# Patient Record
Sex: Female | Born: 1950 | Race: Black or African American | Hispanic: No | Marital: Married | Smoking: Current every day smoker
Health system: Southern US, Community
[De-identification: ages and names within clinical notes are randomized; demographics above are authoritative.]

## PROBLEM LIST (undated history)

## (undated) DIAGNOSIS — E785 Hyperlipidemia, unspecified: Secondary | ICD-10-CM

## (undated) DIAGNOSIS — J40 Bronchitis, not specified as acute or chronic: Secondary | ICD-10-CM

## (undated) DIAGNOSIS — I10 Essential (primary) hypertension: Secondary | ICD-10-CM

## (undated) DIAGNOSIS — I4891 Unspecified atrial fibrillation: Secondary | ICD-10-CM

## (undated) DIAGNOSIS — Z972 Presence of dental prosthetic device (complete) (partial): Secondary | ICD-10-CM

## (undated) DIAGNOSIS — Z720 Tobacco use: Secondary | ICD-10-CM

## (undated) DIAGNOSIS — J189 Pneumonia, unspecified organism: Secondary | ICD-10-CM

## (undated) DIAGNOSIS — I214 Non-ST elevation (NSTEMI) myocardial infarction: Secondary | ICD-10-CM

## (undated) HISTORY — PX: BREAST CYST EXCISION: SHX579

## (undated) HISTORY — PX: COLONOSCOPY WITH PROPOFOL: SHX5780

---

## 1982-07-31 HISTORY — PX: ABDOMINAL HYSTERECTOMY: SHX81

## 2004-07-28 ENCOUNTER — Ambulatory Visit: Payer: Self-pay | Admitting: Internal Medicine

## 2006-05-25 ENCOUNTER — Ambulatory Visit: Payer: Self-pay | Admitting: Internal Medicine

## 2007-01-21 ENCOUNTER — Other Ambulatory Visit: Payer: Self-pay

## 2007-01-21 ENCOUNTER — Emergency Department: Payer: Self-pay | Admitting: Unknown Physician Specialty

## 2007-01-21 IMAGING — CR DG CHEST 2V
1 series · 2 of 2 positions shown · non-contrast
Comparison: none

REASON FOR EXAM: shortness of breath
COMMENTS:

PROCEDURE:     DXR - DXR CHEST PA (OR AP) AND LATERAL  - [DATE]  [DATE]
RESULT:     The lung fields are clear.  The heart, mediastinal and osseous
structures show no acute changes.

[Series 1: view not recorded · 0.17mm/px · 2 of 2 slices shown]
[im 1/2]
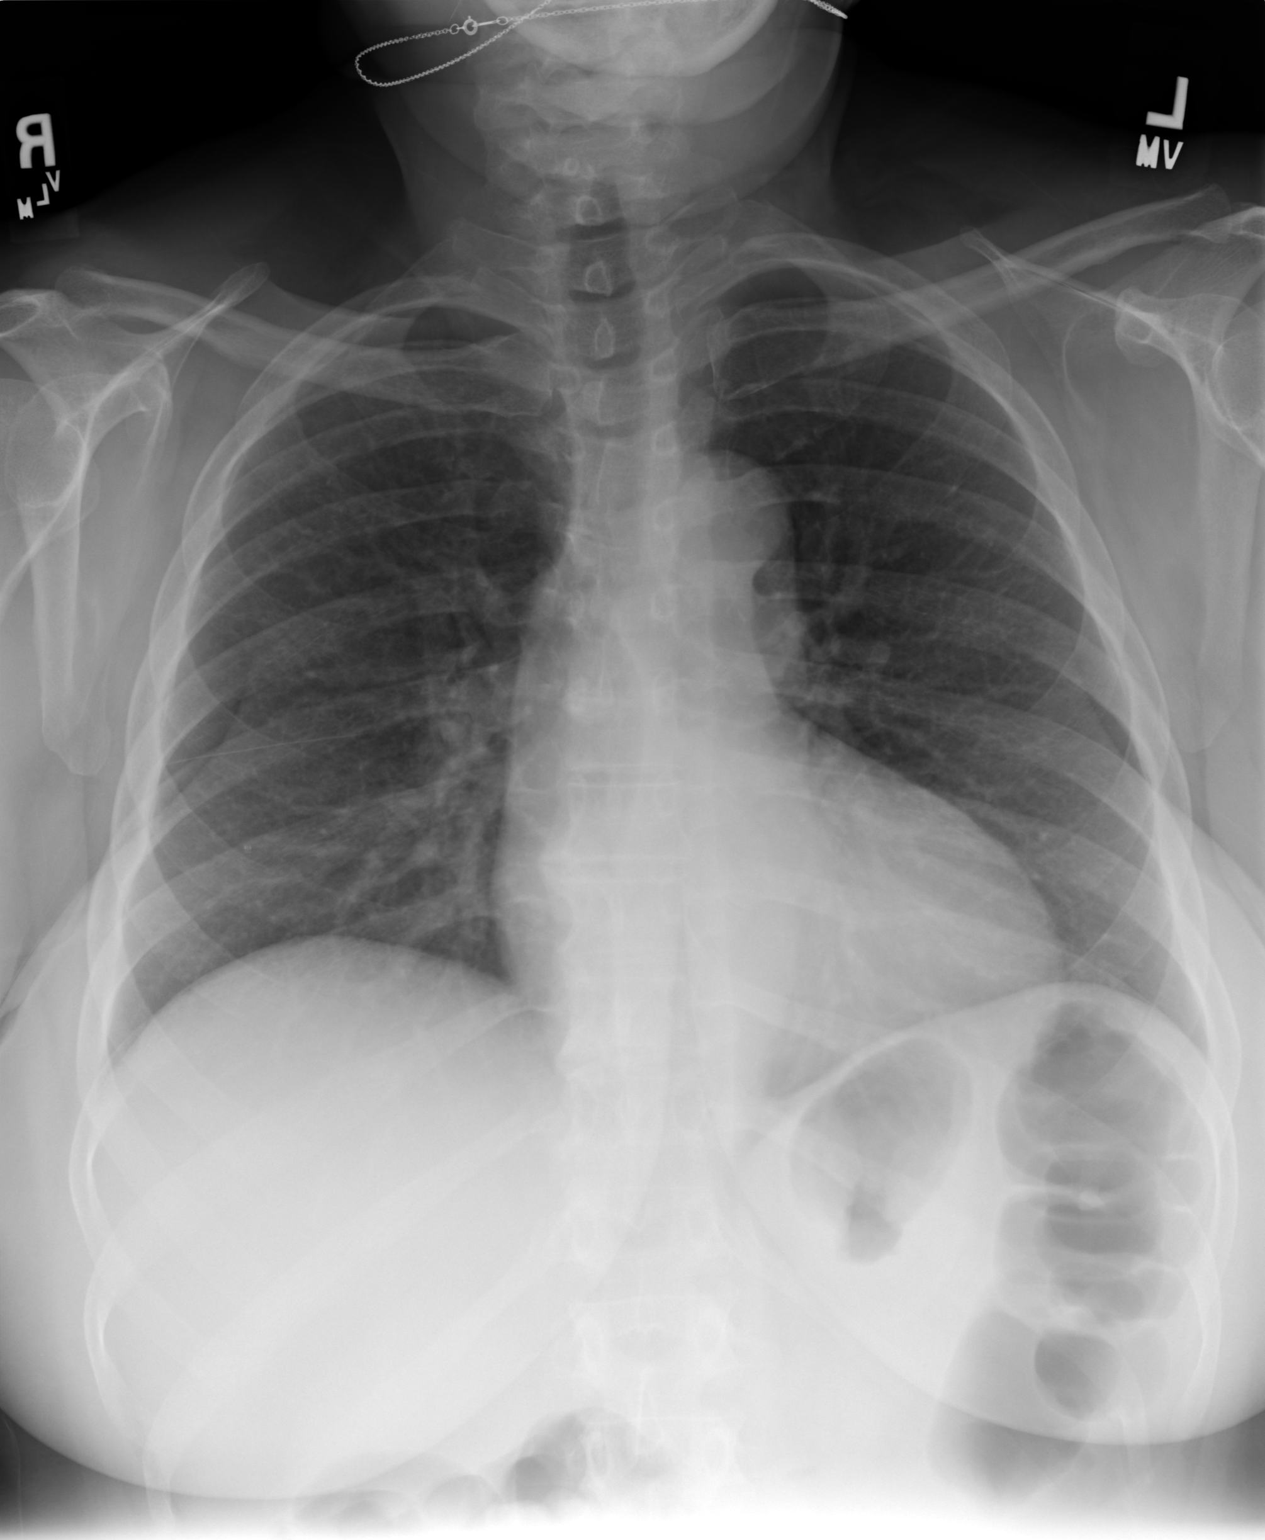
[im 2/2]
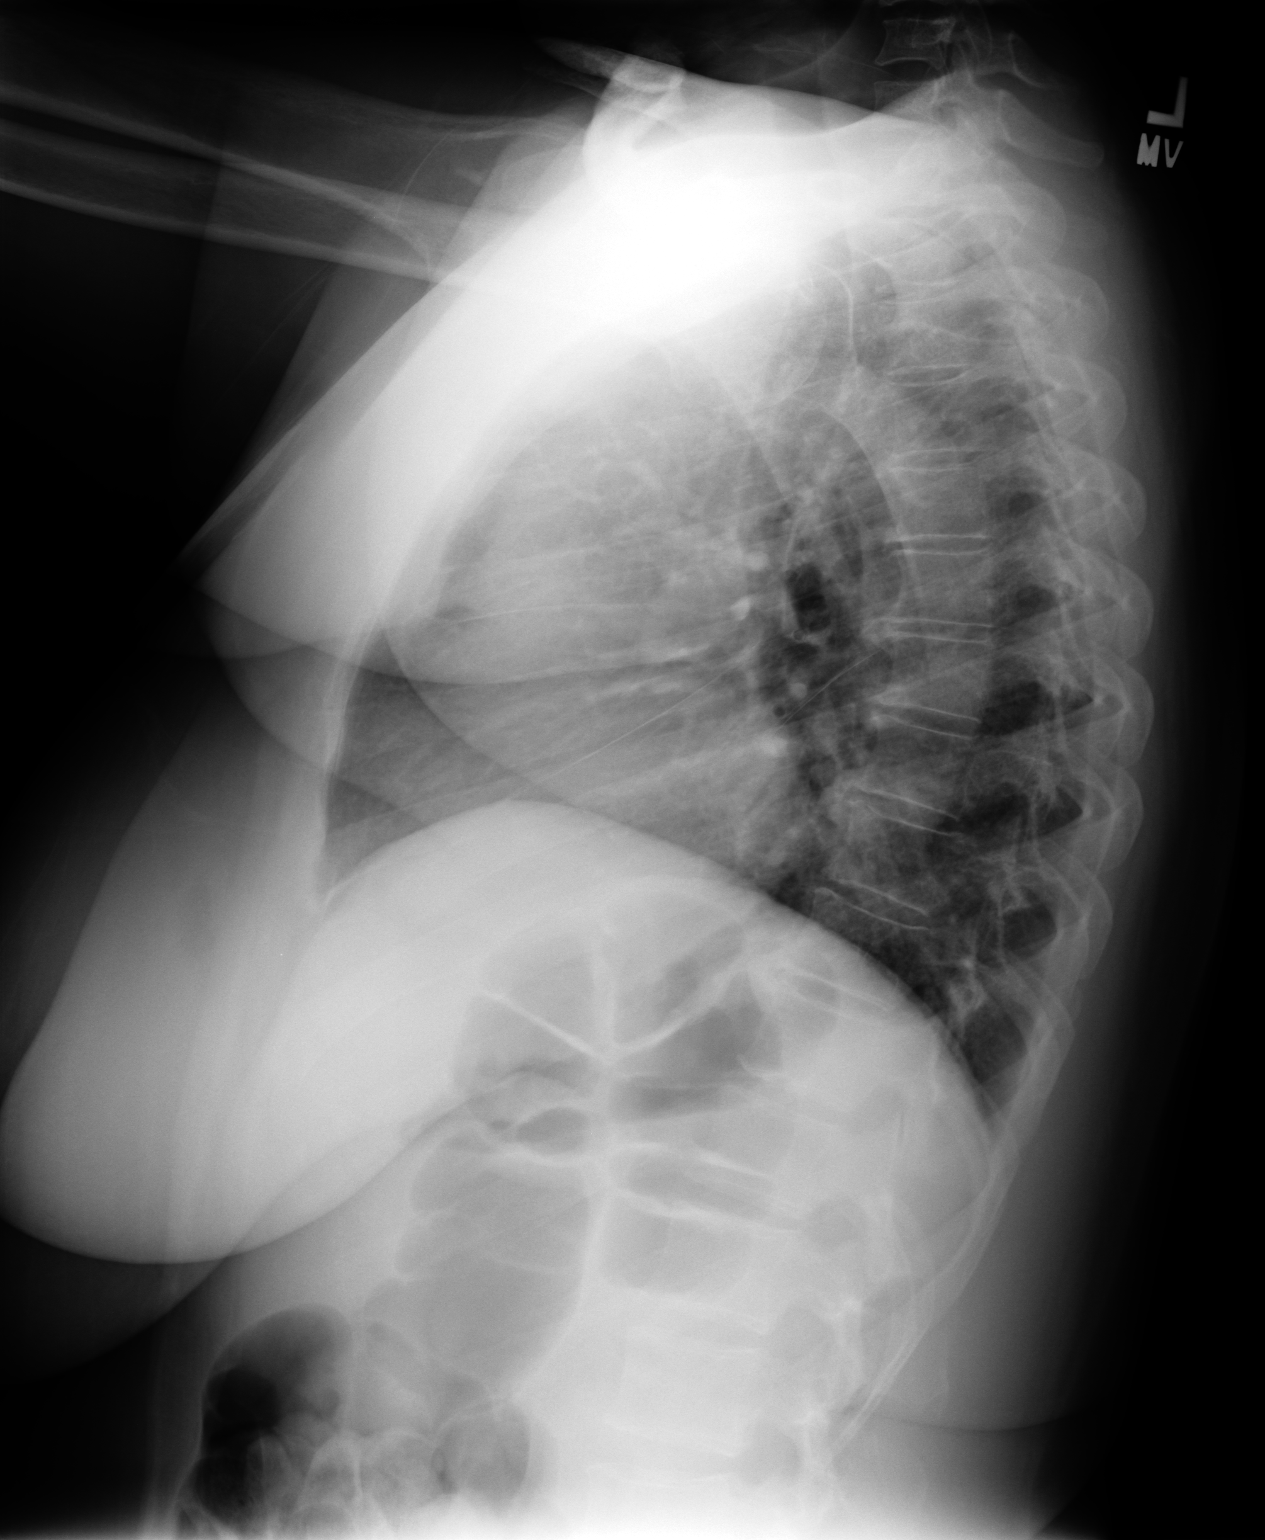

[2 of 2 positions shown; findings below may reference images not displayed]

IMPRESSION: No acute changes are identified.

## 2007-06-25 ENCOUNTER — Ambulatory Visit: Payer: Self-pay | Admitting: Internal Medicine

## 2007-06-25 IMAGING — MG MM CAD SCREENING MAMMO
1 series · 5 of 5 positions shown · non-contrast
Comparison: none

REASON FOR EXAM: Screening mammogram
COMMENTS:

[Series 3140: R CC · right · 5 of 5 slices shown]
[im 1/5]
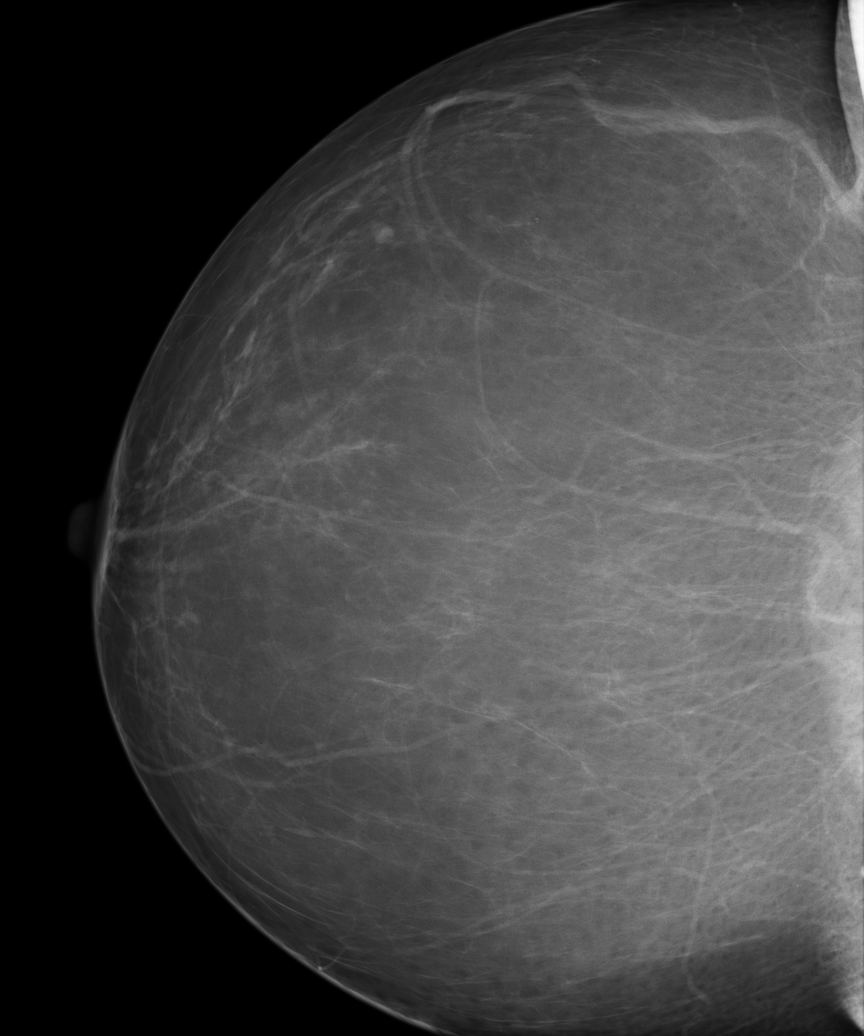
[im 2/5]
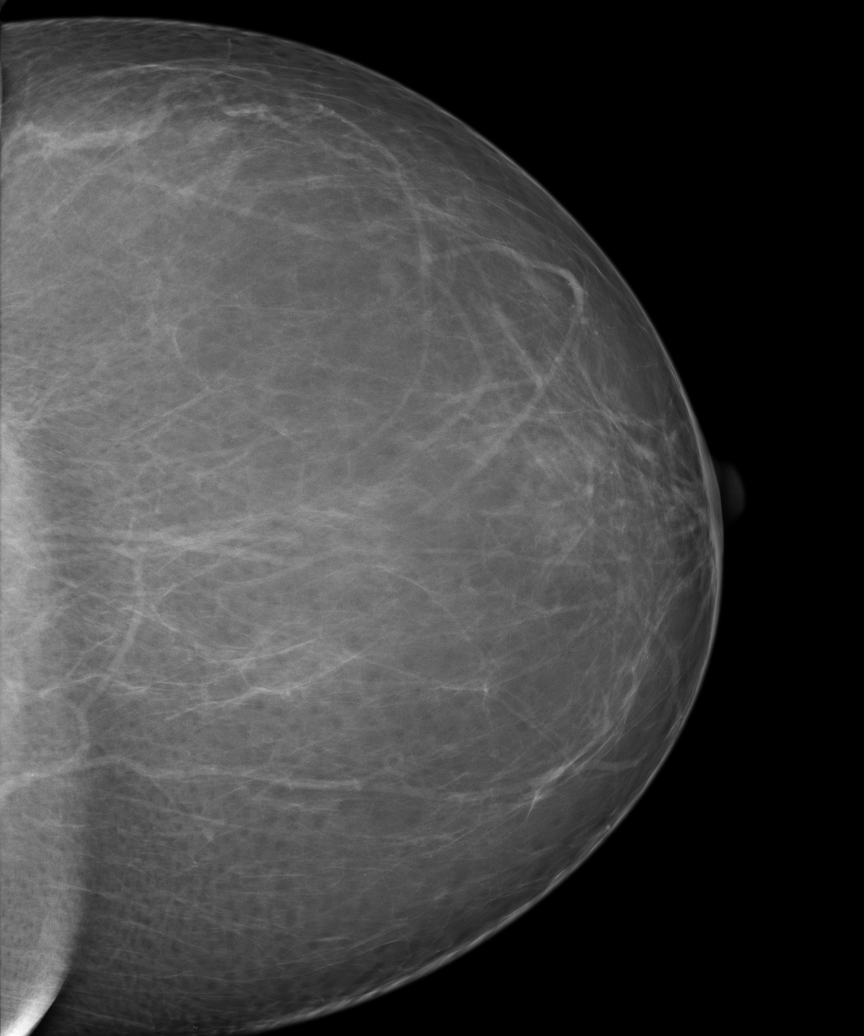
[im 3/5]
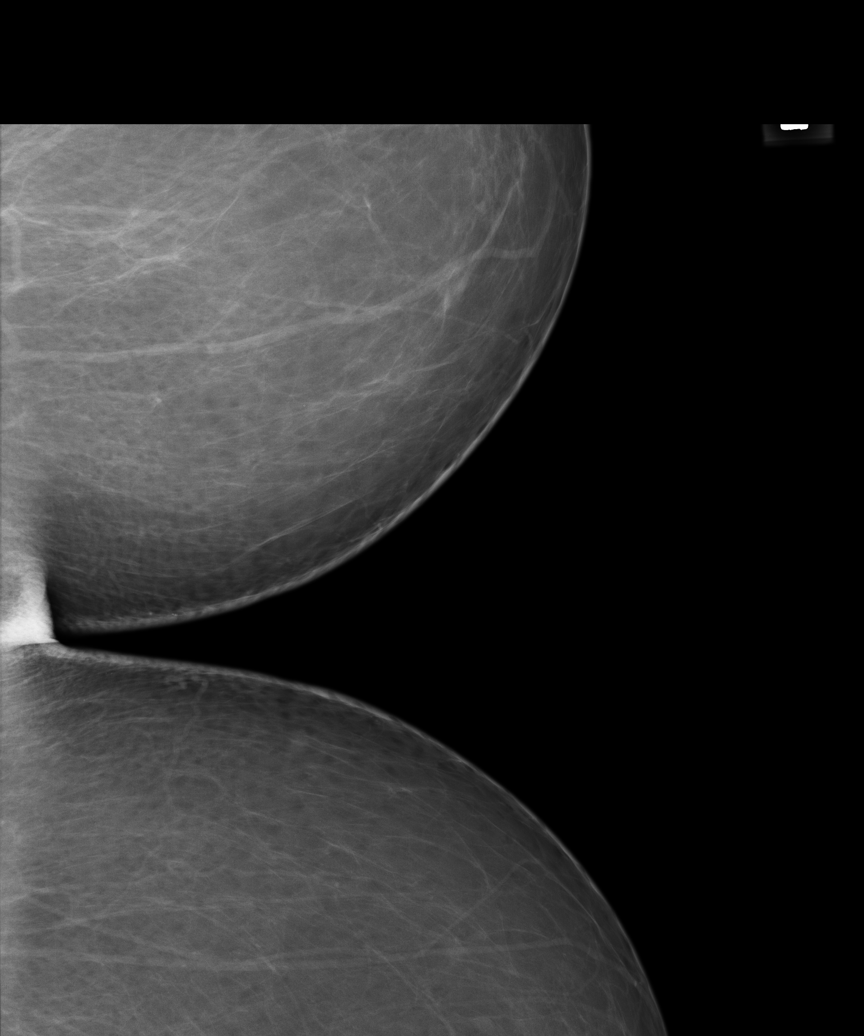
[im 4/5]
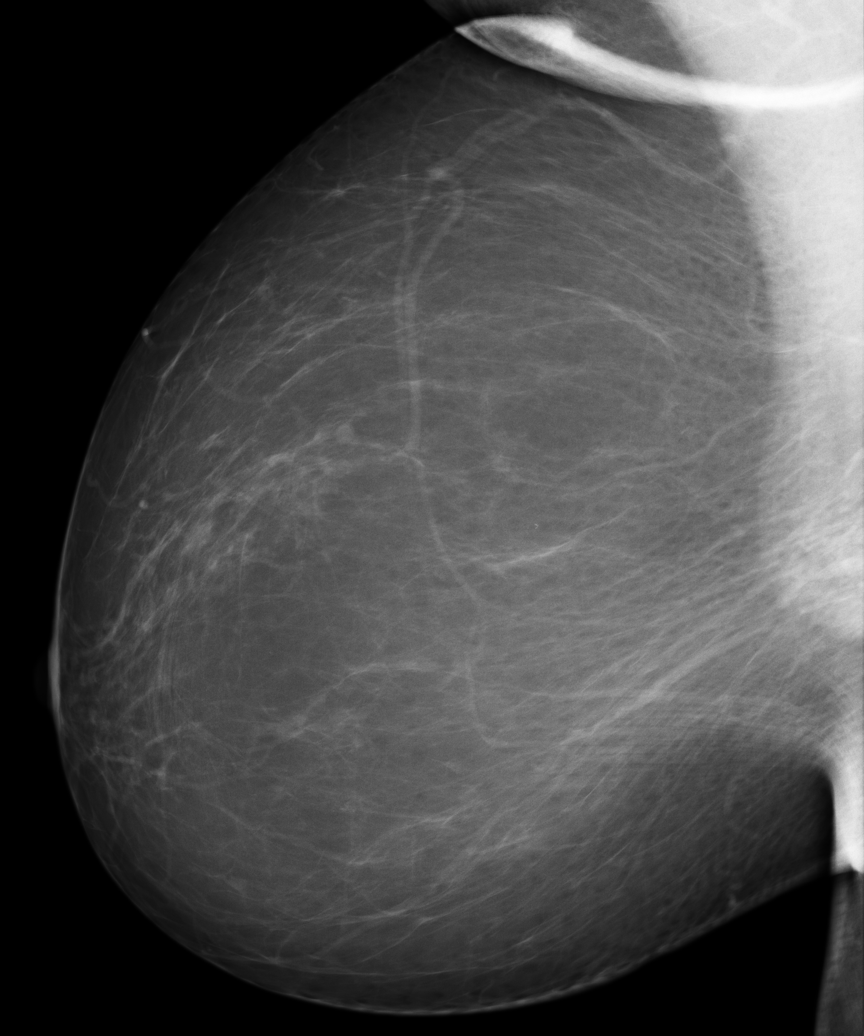
[im 5/5]
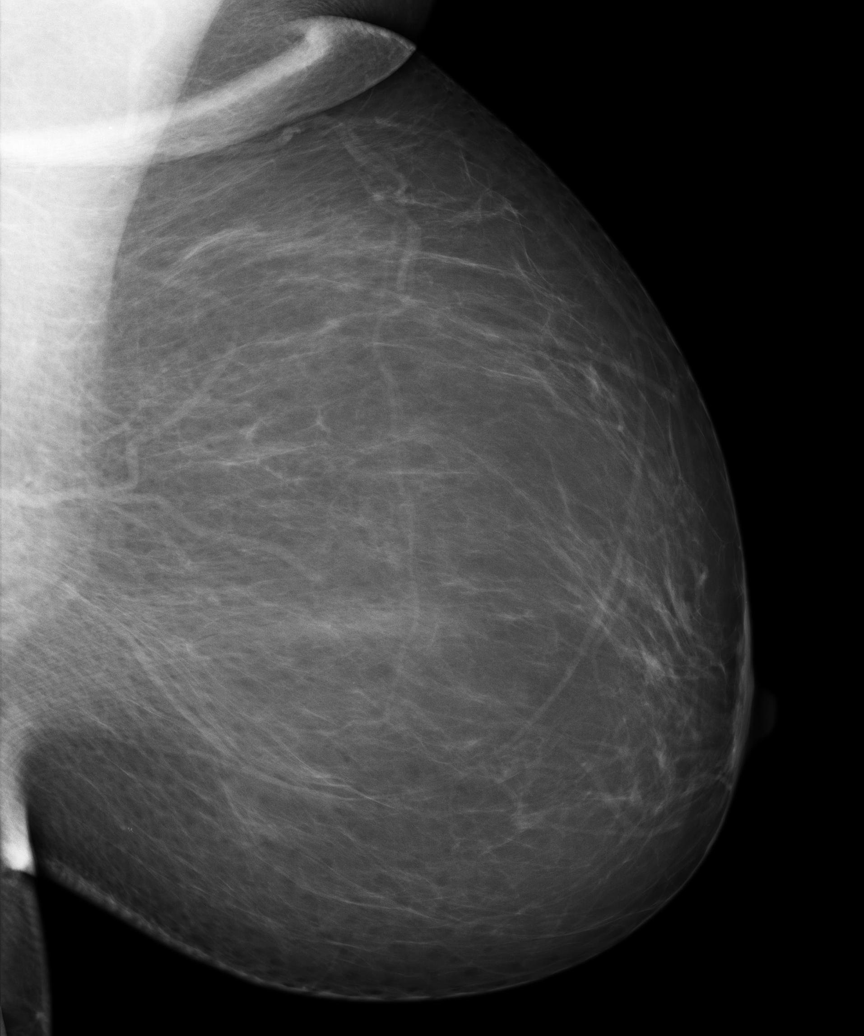

[5 of 5 positions shown; findings below may reference images not displayed]

PROCEDURE:     MAM - MAM DGTL SCREENING MAMMO W/CAD  - [DATE]  [DATE]

RESULT:     Comparison is made to prior examinations of [DATE] and
[DATE].

No dominant mass or malignant appearing microcalcifications are seen. A few,
benign appearing calcifications are noted bilaterally. The breast parenchyma
is of the fatty type.
IMPRESSION: 1.     Bilaterally benign appearing screening mammography.
2.     Continued annual screening mammography is recommended.
3.     BI-RADS: Category 2 - Benign Finding.

Thank you for this opportunity to contribute to the care of your patient.

A NEGATIVE MAMMOGRAM REPORT DOES NOT PRECLUDE BIOPSY OR OTHER EVALUATION OF
A CLINICALLY PALPABLE OR OTHERWISE SUSPICIOUS MASS OR LESION. BREAST CANCER
MAY NOT BE DETECTED BY MAMMOGRAPHY IN UP TO 10% OF CASES.

## 2007-08-28 ENCOUNTER — Emergency Department: Payer: Self-pay

## 2007-08-28 ENCOUNTER — Other Ambulatory Visit: Payer: Self-pay

## 2007-08-28 IMAGING — CR DG CHEST 1V PORT
1 series · 1 of 1 positions shown · non-contrast
Comparison: none

REASON FOR EXAM: Syncope
COMMENTS:

[view not recorded]
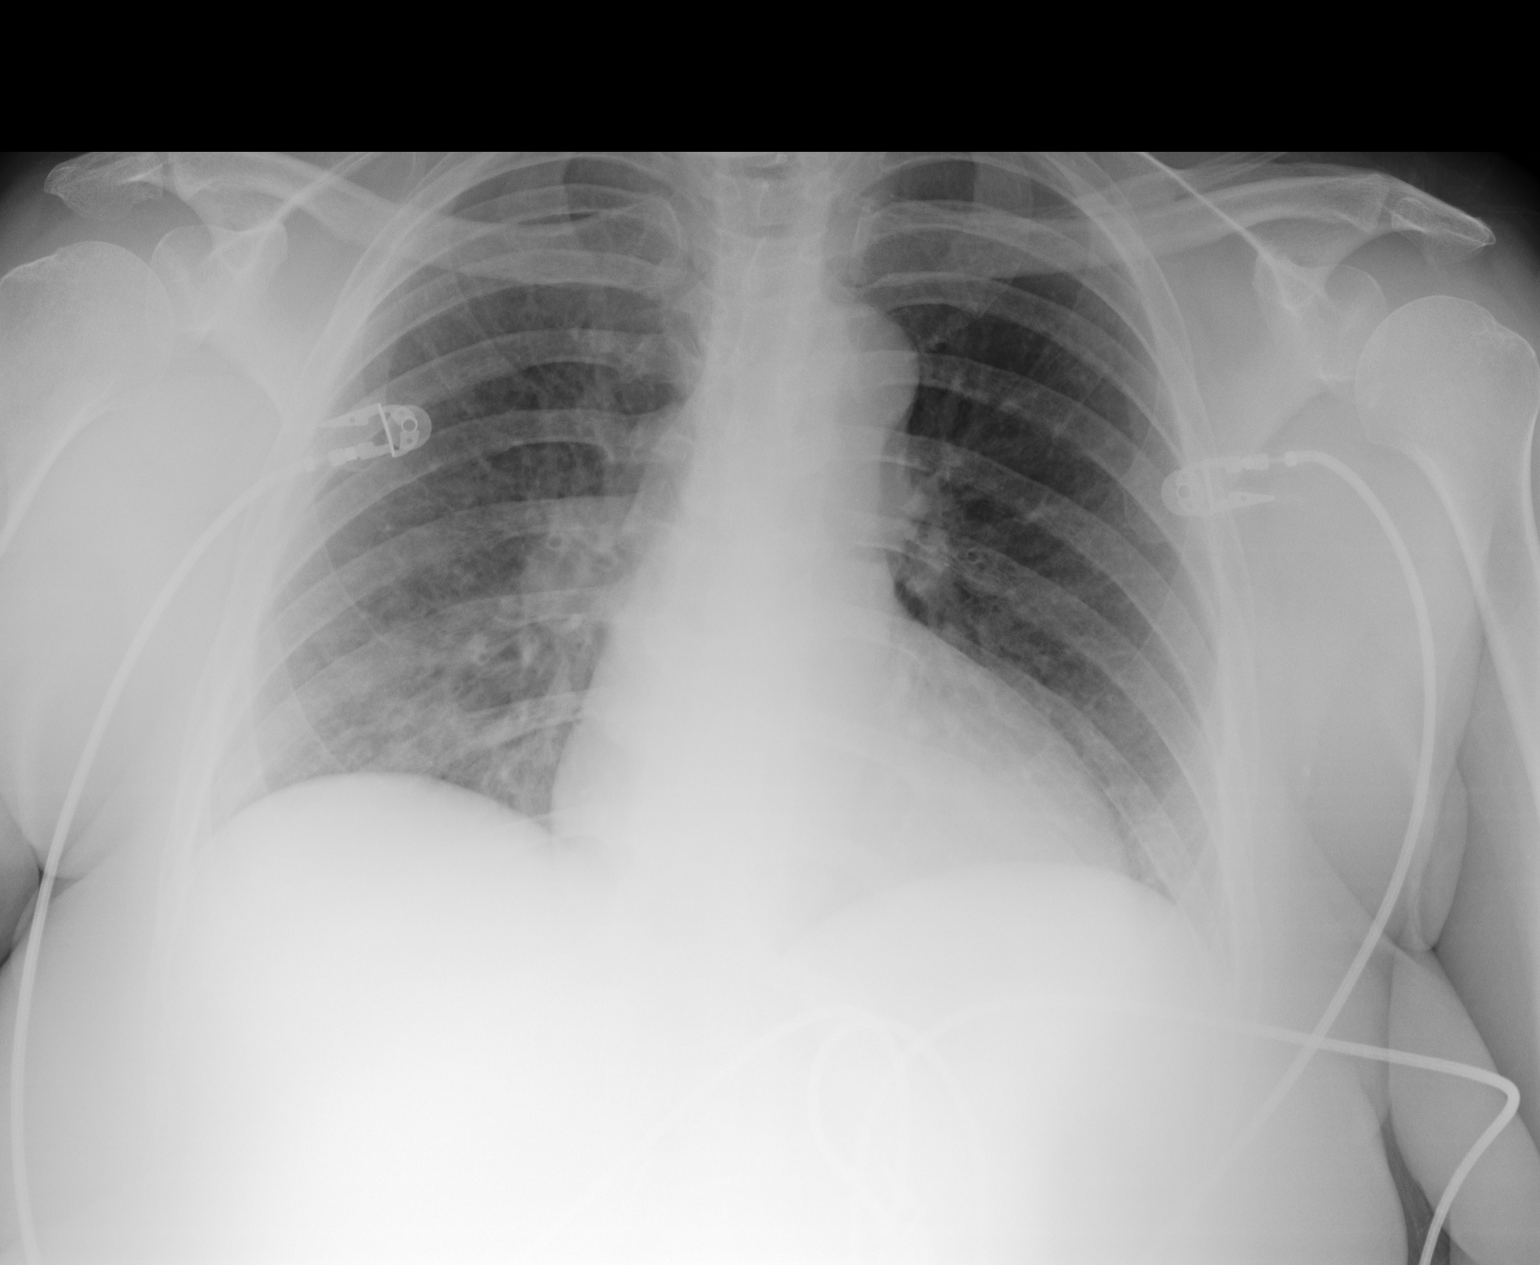

[1 of 1 positions shown; findings below may reference images not displayed]

PROCEDURE:     DXR - DXR PORTABLE CHEST SINGLE VIEW  - [DATE] [DATE]

RESULT:     Frontal view of the chest is performed.

Comparison is made to a prior study dated [DATE].

The patient has taken a shallow inspiration. The study is slightly
underexposed. A very mild area of increased density projects in the region
of the RIGHT lower lobe. The cardiac silhouette and visualized bony skeleton
are unremarkable.
IMPRESSION: Atelectasis versus infiltrate, RIGHT lower lobe.

## 2008-07-21 ENCOUNTER — Ambulatory Visit: Payer: Self-pay | Admitting: Internal Medicine

## 2008-07-21 IMAGING — MG MM CAD SCREENING MAMMO
1 series · 5 of 5 positions shown · non-contrast
Comparison: none

REASON FOR EXAM: Screening mammogram
COMMENTS:

[R CC · right · 5 of 5 slices shown]
[im 1/5]
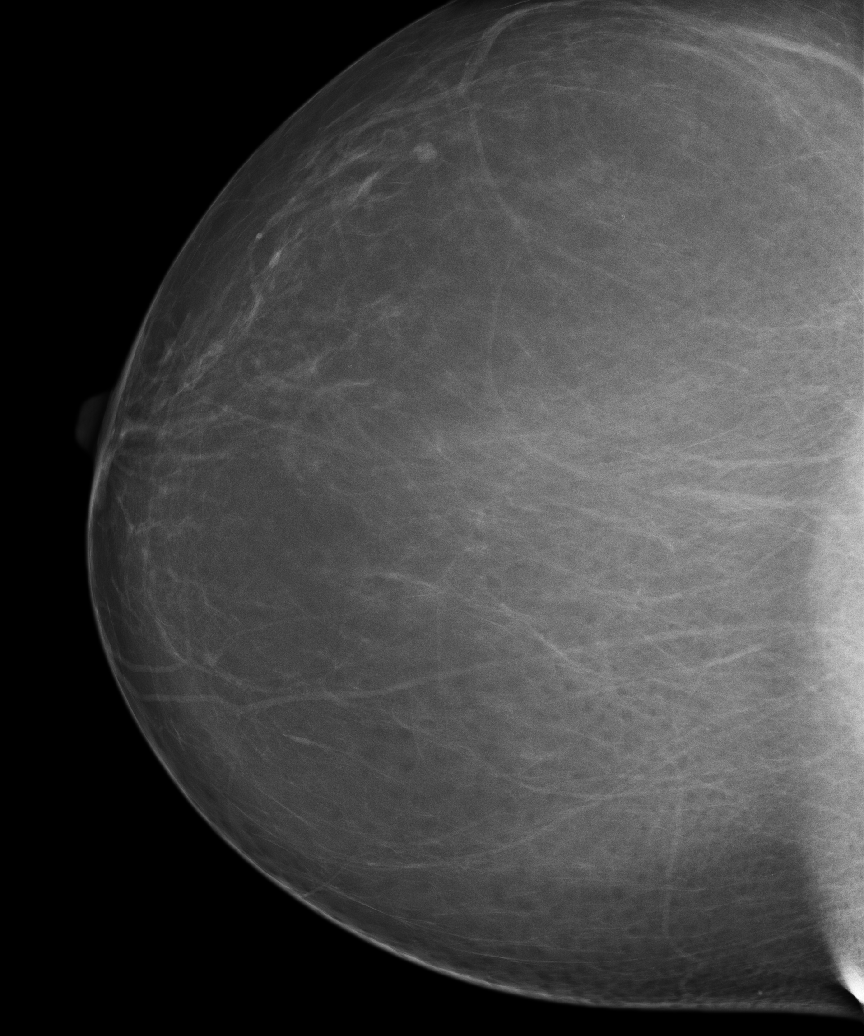
[im 2/5]
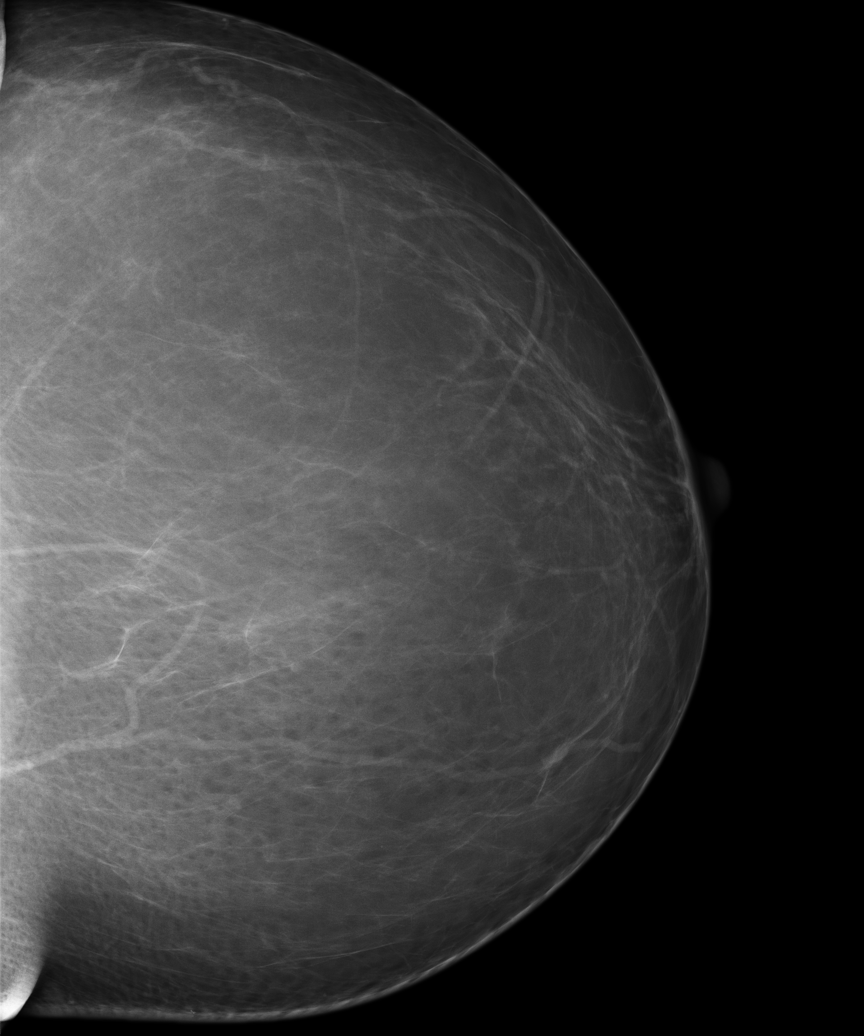
[im 3/5]
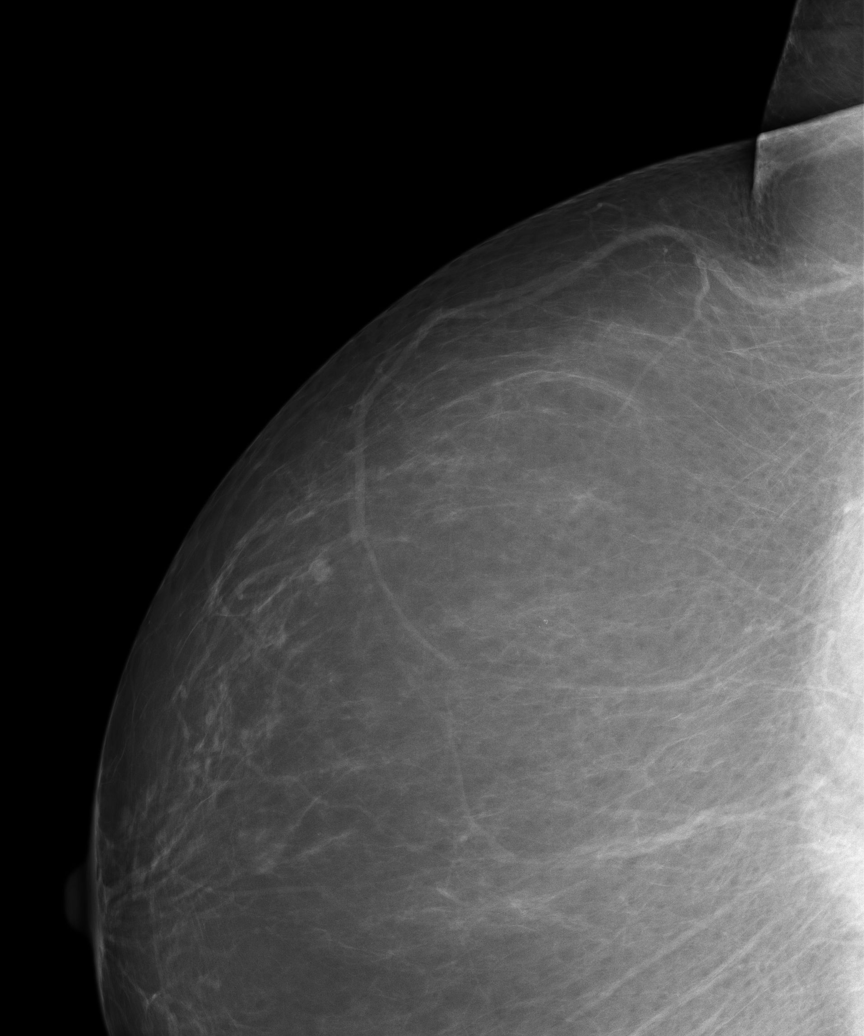
[im 4/5]
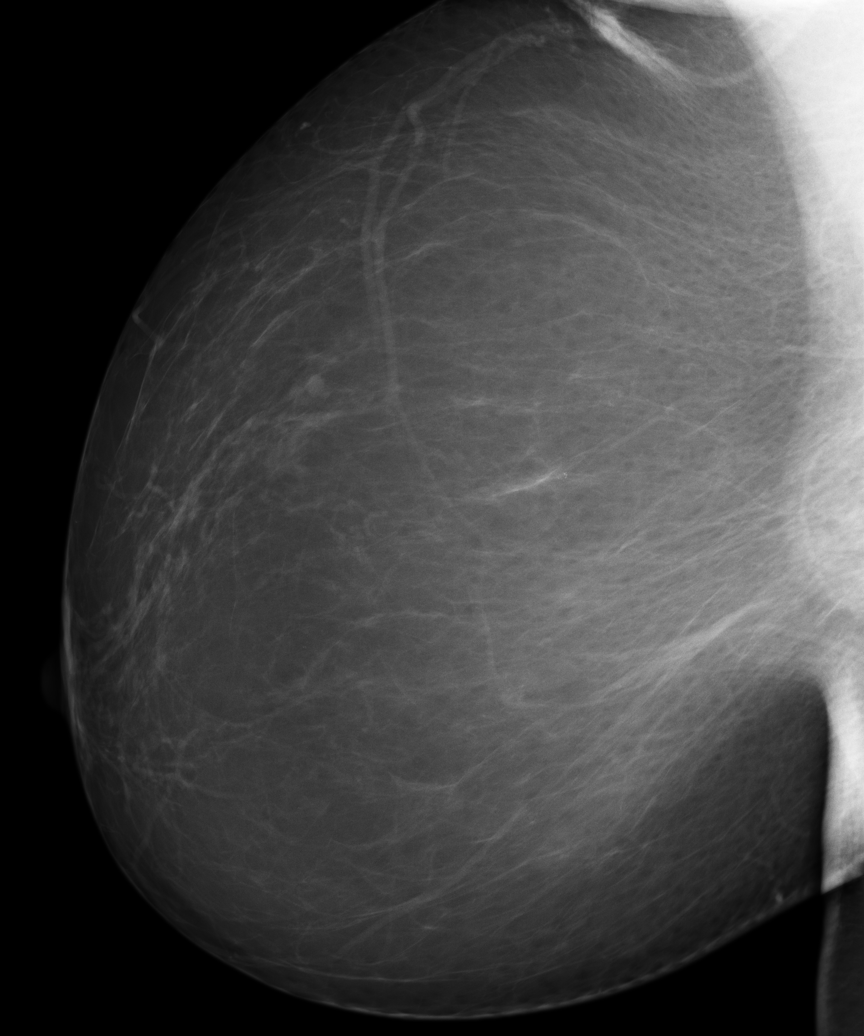
[im 5/5]
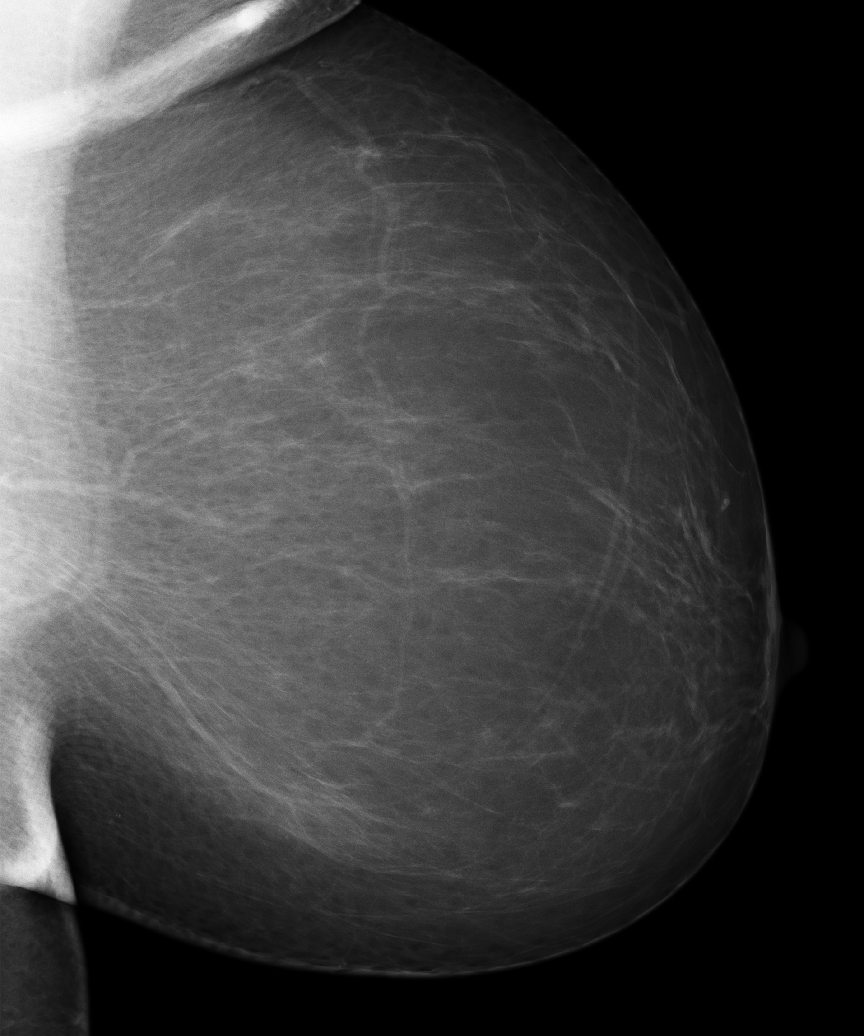

[5 of 5 positions shown; findings below may reference images not displayed]

PROCEDURE:     MAM - MAM DGTL SCREENING MAMMO W/CAD  - [DATE]  [DATE]

RESULT:     Comparison is made to film screen images of [DATE] and to
digital images of [DATE].

The breasts exhibit a mildly dense parenchymal pattern. There is no
developing density, dominant mass or malignant calcification. Stable
calcifications are present in the RIGHT breast.
IMPRESSION: Stable, benign appearing bilateral mammogram.

BI-RADS: Category 2 - Benign Finding

RECOMMENDATION:  Please continue to encourage annual mammographic follow-up.

## 2009-10-25 ENCOUNTER — Emergency Department: Payer: Self-pay | Admitting: Internal Medicine

## 2011-03-07 ENCOUNTER — Ambulatory Visit: Payer: Self-pay | Admitting: Internal Medicine

## 2011-03-07 IMAGING — MG MM CAD SCREENING MAMMO
1 series · 5 of 5 positions shown · non-contrast
Comparison: none

REASON FOR EXAM: scr
COMMENTS:

PROCEDURE:     MAM - MAM DGTL SCREENING MAMMO W/CAD  - [DATE]  [DATE]
RESULT:
COMPARISONS: [DATE] and [DATE].

[Series 3932: R CC · right · 5 of 5 slices shown]
[im 1/5]
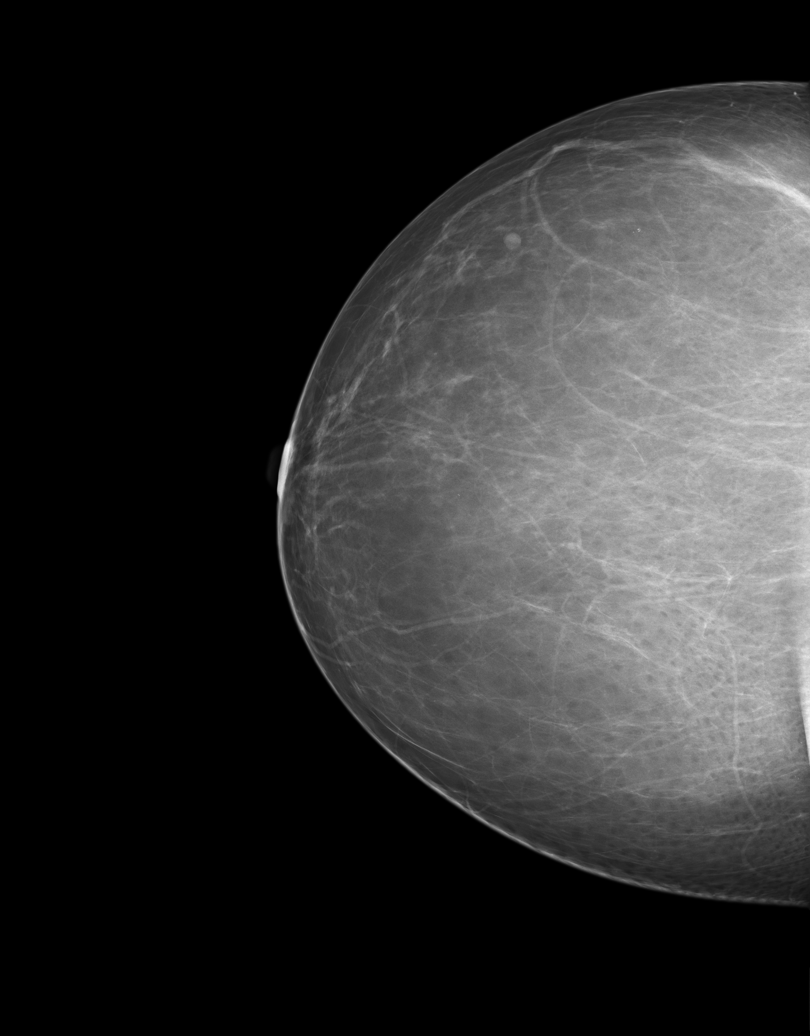
[im 2/5]
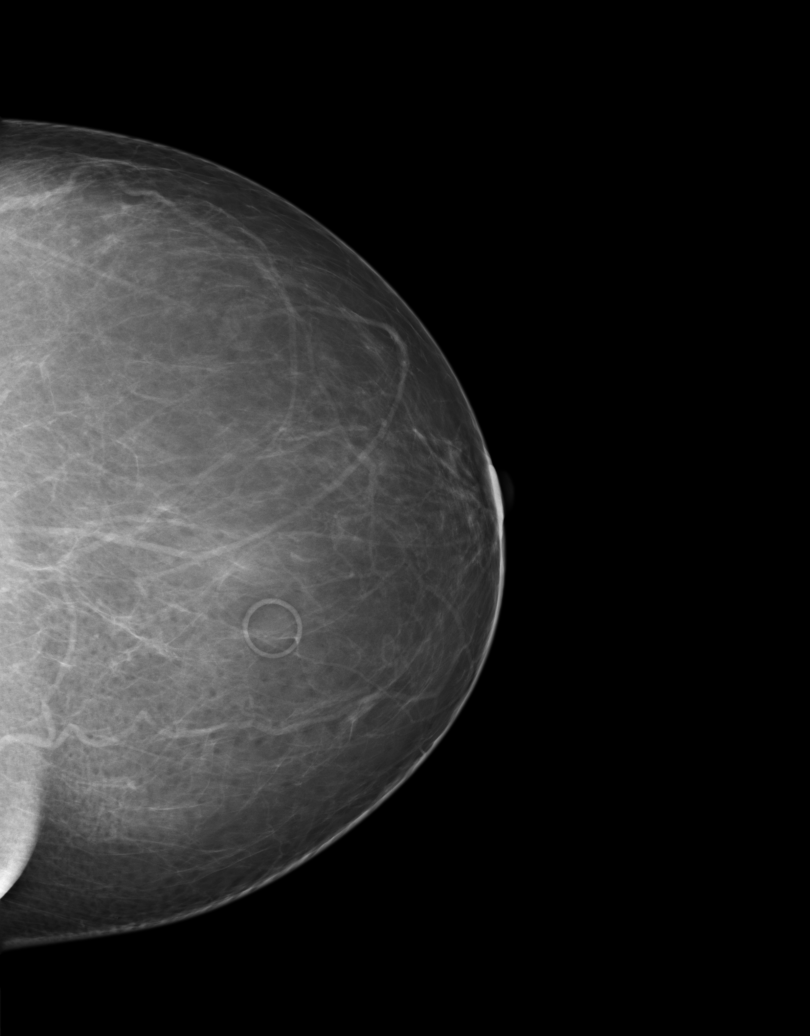
[im 3/5]
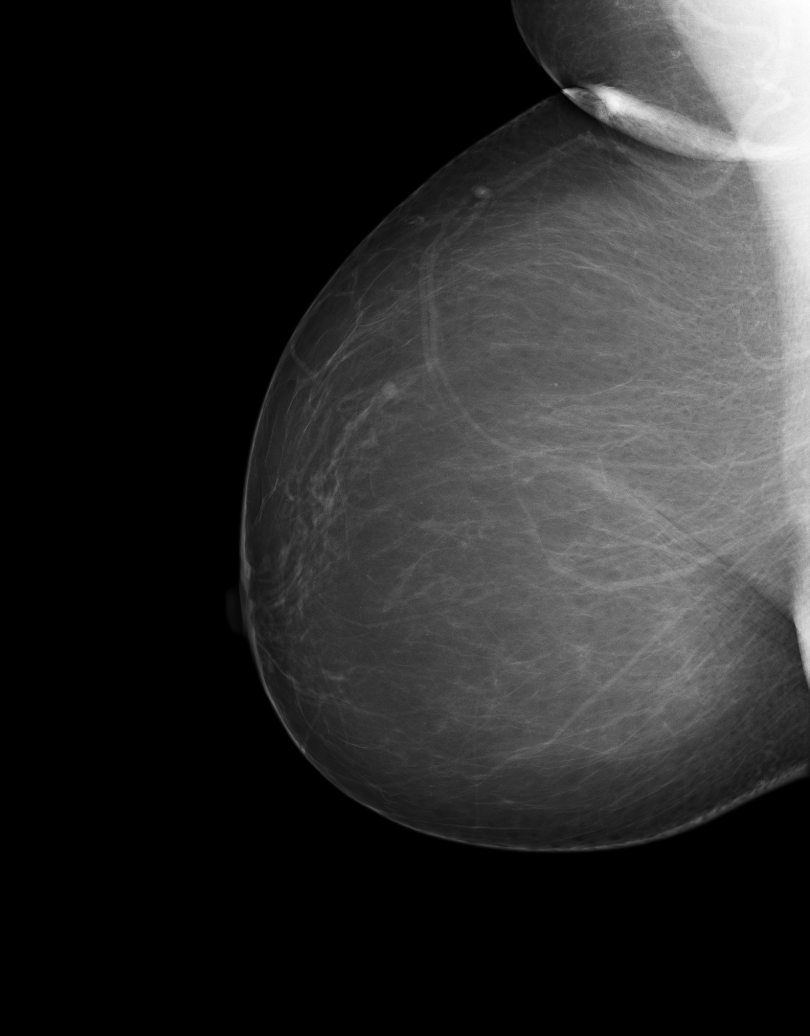
[im 4/5]
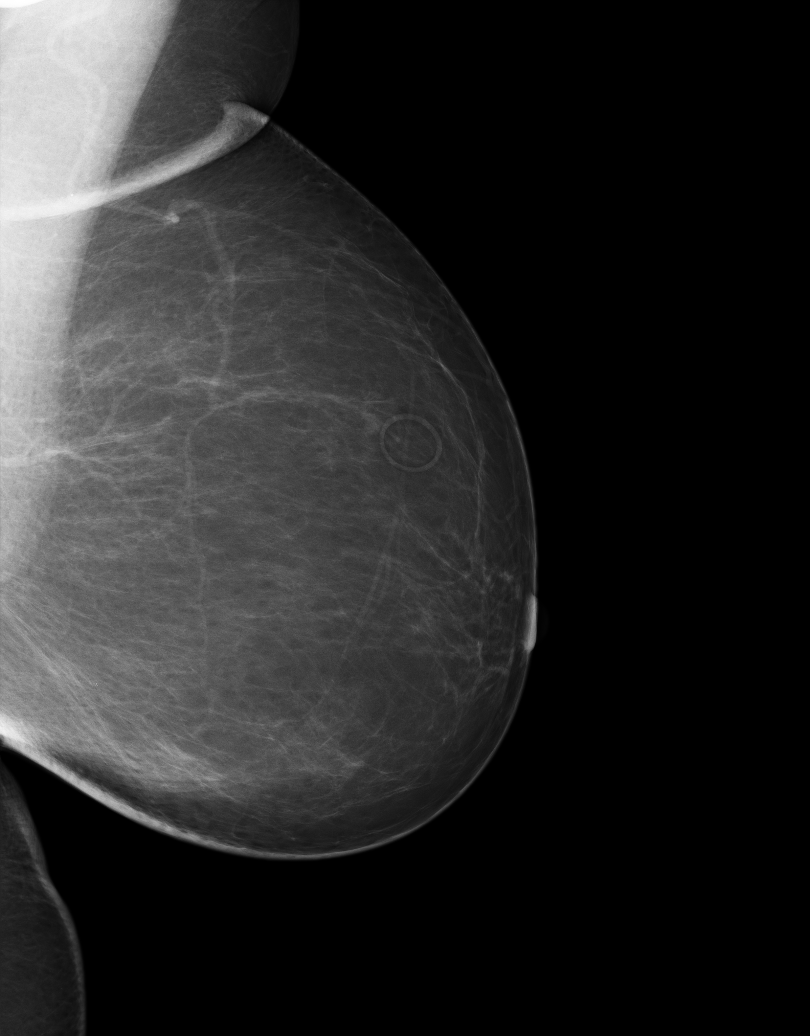
[im 5/5]
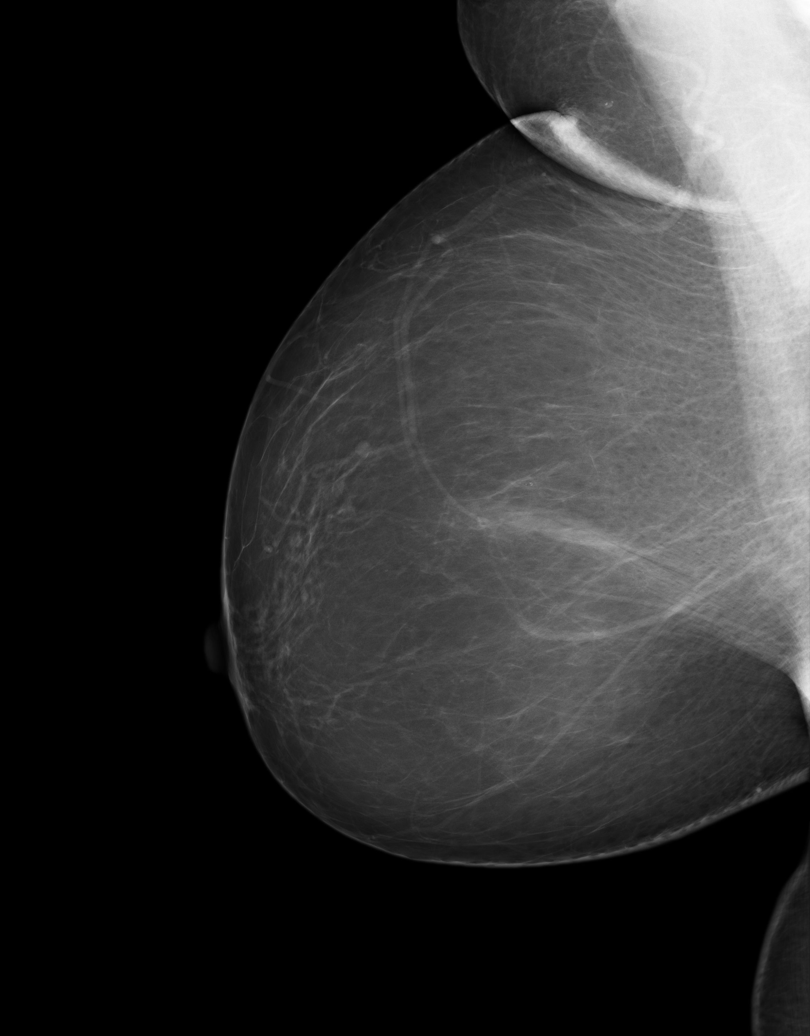

[5 of 5 positions shown; findings below may reference images not displayed]

FINDINGS: The breast tissue is predominantly fatty. No suspicious masses or
calcifications are identified. No areas of architectural distortion.
IMPRESSION: BI-RADS: Category 1 - Negative

Recommend continued annual screening mammography.

A NEGATIVE MAMMOGRAM REPORT DOES NOT PRECLUDE BIOPSY OR OTHER EVALUATION OF
A CLINICALLY PALPABLE OR OTHERWISE SUSPICIOUS MASS OR LESION. BREAST CANCER
MAY NOT BE DETECTED BY MAMMOGRAPHY IN UP TO 10% OF CASES.

## 2011-11-30 ENCOUNTER — Observation Stay (HOSPITAL_COMMUNITY)
Admission: EM | Admit: 2011-11-30 | Discharge: 2011-12-02 | Disposition: A | Discharge status: Home / Self Care | Payer: BC Managed Care – PPO | Attending: Infectious Diseases | Admitting: Infectious Diseases

## 2011-11-30 ENCOUNTER — Encounter (HOSPITAL_COMMUNITY): Payer: Self-pay | Admitting: Emergency Medicine

## 2011-11-30 ENCOUNTER — Observation Stay (HOSPITAL_COMMUNITY): Payer: BC Managed Care – PPO

## 2011-11-30 ENCOUNTER — Emergency Department (HOSPITAL_COMMUNITY): Payer: BC Managed Care – PPO

## 2011-11-30 DIAGNOSIS — R0789 Other chest pain: Principal | ICD-10-CM | POA: Insufficient documentation

## 2011-11-30 DIAGNOSIS — R609 Edema, unspecified: Secondary | ICD-10-CM | POA: Insufficient documentation

## 2011-11-30 DIAGNOSIS — R0602 Shortness of breath: Secondary | ICD-10-CM | POA: Insufficient documentation

## 2011-11-30 DIAGNOSIS — M7989 Other specified soft tissue disorders: Secondary | ICD-10-CM

## 2011-11-30 DIAGNOSIS — I10 Essential (primary) hypertension: Secondary | ICD-10-CM | POA: Diagnosis present

## 2011-11-30 DIAGNOSIS — F329 Major depressive disorder, single episode, unspecified: Secondary | ICD-10-CM | POA: Insufficient documentation

## 2011-11-30 DIAGNOSIS — F17200 Nicotine dependence, unspecified, uncomplicated: Secondary | ICD-10-CM | POA: Insufficient documentation

## 2011-11-30 DIAGNOSIS — R600 Localized edema: Secondary | ICD-10-CM | POA: Diagnosis present

## 2011-11-30 DIAGNOSIS — E785 Hyperlipidemia, unspecified: Secondary | ICD-10-CM | POA: Insufficient documentation

## 2011-11-30 DIAGNOSIS — Z8249 Family history of ischemic heart disease and other diseases of the circulatory system: Secondary | ICD-10-CM | POA: Insufficient documentation

## 2011-11-30 DIAGNOSIS — F3289 Other specified depressive episodes: Secondary | ICD-10-CM | POA: Insufficient documentation

## 2011-11-30 DIAGNOSIS — R079 Chest pain, unspecified: Secondary | ICD-10-CM | POA: Diagnosis present

## 2011-11-30 DIAGNOSIS — R062 Wheezing: Secondary | ICD-10-CM

## 2011-11-30 HISTORY — DX: Essential (primary) hypertension: I10

## 2011-11-30 HISTORY — DX: Hyperlipidemia, unspecified: E78.5

## 2011-11-30 HISTORY — DX: Tobacco use: Z72.0

## 2011-11-30 LAB — BASIC METABOLIC PANEL WITH GFR
BUN: 19 mg/dL (ref 6–23)
CO2: 32 meq/L (ref 19–32)
Calcium: 9.4 mg/dL (ref 8.4–10.5)
Chloride: 101 meq/L (ref 96–112)
Creatinine, Ser: 1.12 mg/dL — ABNORMAL HIGH (ref 0.50–1.10)
GFR calc Af Amer: 60 mL/min — ABNORMAL LOW
GFR calc non Af Amer: 52 mL/min — ABNORMAL LOW
Glucose, Bld: 85 mg/dL (ref 70–99)
Potassium: 3.8 meq/L (ref 3.5–5.1)
Sodium: 141 meq/L (ref 135–145)

## 2011-11-30 LAB — DIFFERENTIAL
Basophils Absolute: 0 10*3/uL (ref 0.0–0.1)
Basophils Relative: 0 % (ref 0–1)
Eosinophils Absolute: 0 10*3/uL (ref 0.0–0.7)
Eosinophils Relative: 1 % (ref 0–5)
Lymphocytes Relative: 30 % (ref 12–46)
Lymphs Abs: 1.9 10*3/uL (ref 0.7–4.0)
Monocytes Absolute: 0.4 10*3/uL (ref 0.1–1.0)
Monocytes Relative: 7 % (ref 3–12)
Neutro Abs: 4.1 10*3/uL (ref 1.7–7.7)
Neutrophils Relative %: 63 % (ref 43–77)

## 2011-11-30 LAB — CARDIAC PANEL(CRET KIN+CKTOT+MB+TROPI)
Relative Index: INVALID (ref 0.0–2.5)
Troponin I: <0.3 ng/mL (ref <0.30)

## 2011-11-30 LAB — CBC
HCT: 38.8 % (ref 36.0–46.0)
Hemoglobin: 12.6 g/dL (ref 12.0–15.0)
MCH: 30.2 pg (ref 26.0–34.0)
MCHC: 32.5 g/dL (ref 30.0–36.0)
MCV: 93 fL (ref 78.0–100.0)
Platelets: 212 10*3/uL (ref 150–400)
RBC: 4.17 MIL/uL (ref 3.87–5.11)
RDW: 13.5 % (ref 11.5–15.5)
WBC: 6.5 10*3/uL (ref 4.0–10.5)

## 2011-11-30 LAB — PRO B NATRIURETIC PEPTIDE: Pro B Natriuretic peptide (BNP): 125.5 pg/mL — ABNORMAL HIGH (ref 0–125)

## 2011-11-30 IMAGING — CT CT ANGIO CHEST
2 of 6 series · 18 of 46 positions shown · IV contrast (APPLIED)
Comparison: Chest radiograph - earlier same day

CLINICAL DATA: Hypertension, borderline elevated D-dimer

CT ANGIOGRAPHY CHEST
TECHNIQUE: Multidetector CT imaging of the chest using the
standard protocol during bolus administration of intravenous
contrast. Multiplanar reconstructed images including MIPs were
obtained and reviewed to evaluate the vascular anatomy.

[Series 6: pulm embolism 1.0 b25f thin · axial · 0.67mm/px · z∈[-280,-82]mm · 15 of 218 slices shown]
[im 10/218  lung]
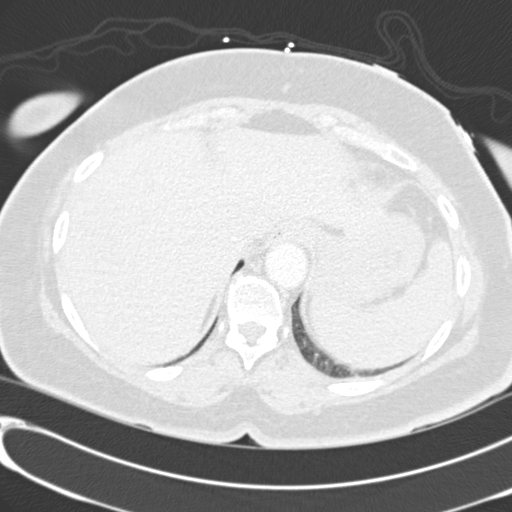
[im 29/218  soft-tissue]
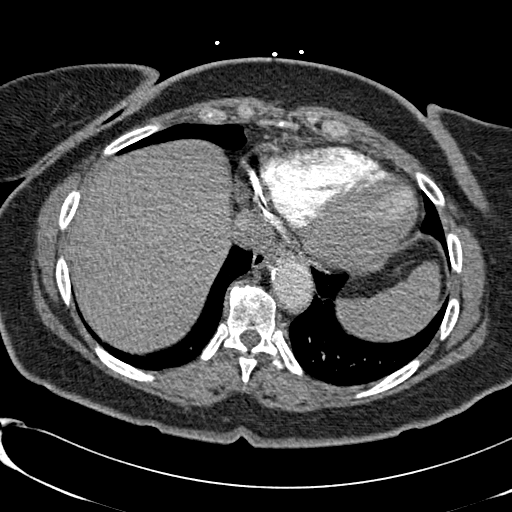
[im 38/218  lung]
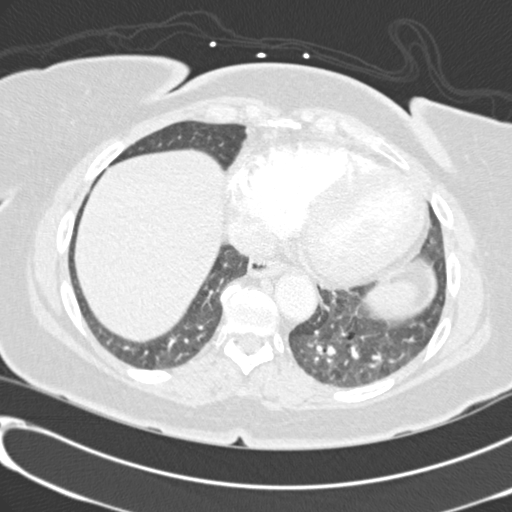
[im 57/218  soft-tissue]
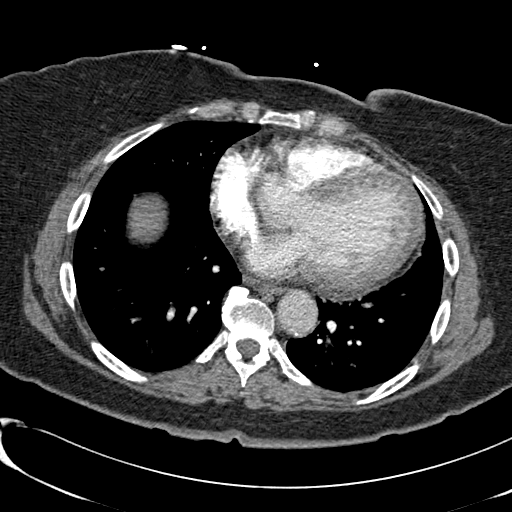
[im 67/218  lung]
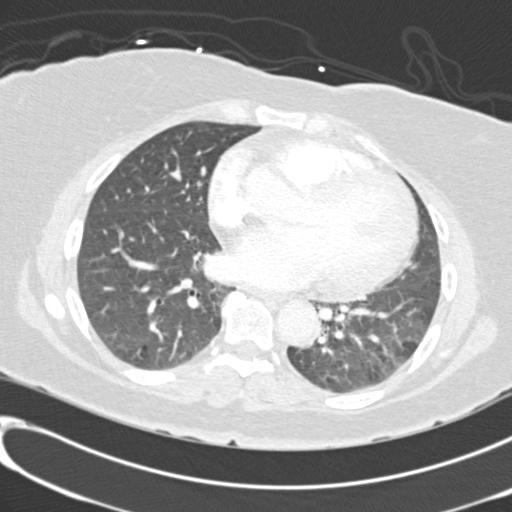
[im 85/218  soft-tissue]
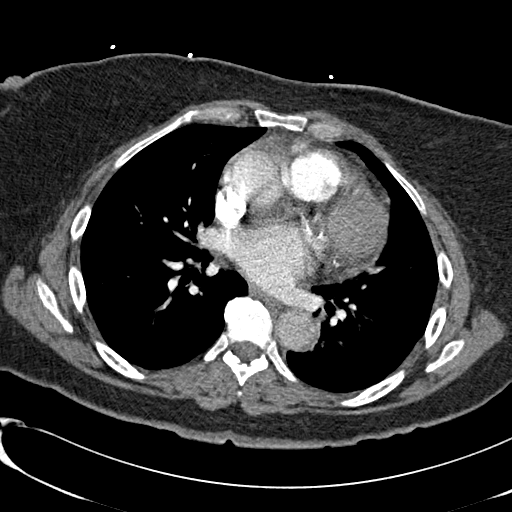
[im 95/218  lung]
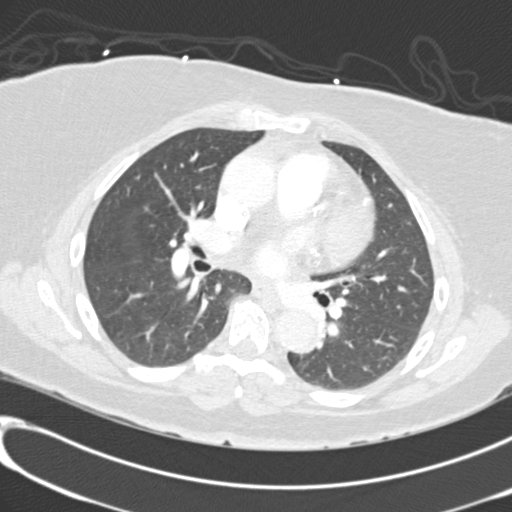
[im 114/218  soft-tissue]
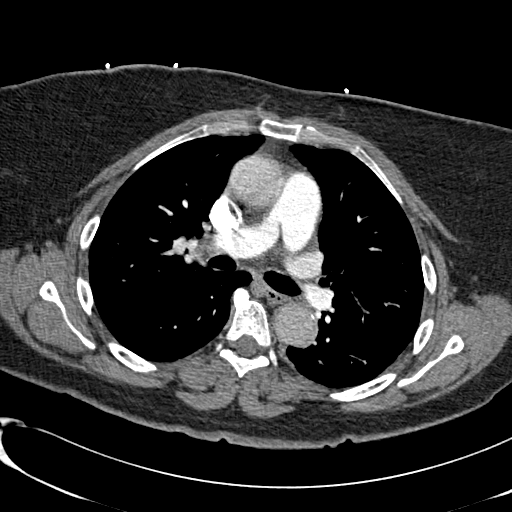
[im 123/218  lung]
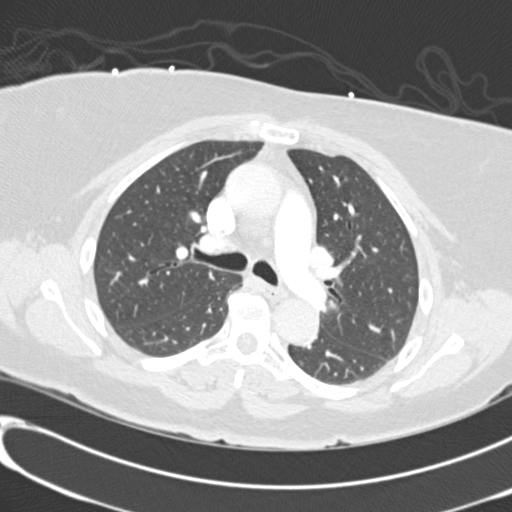
[im 133/218  soft-tissue]
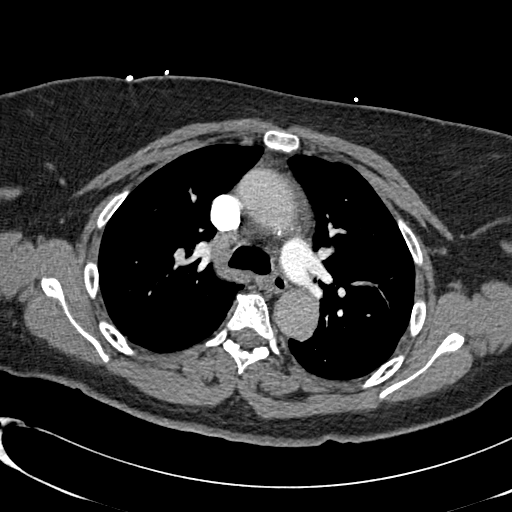
[im 151/218  lung]
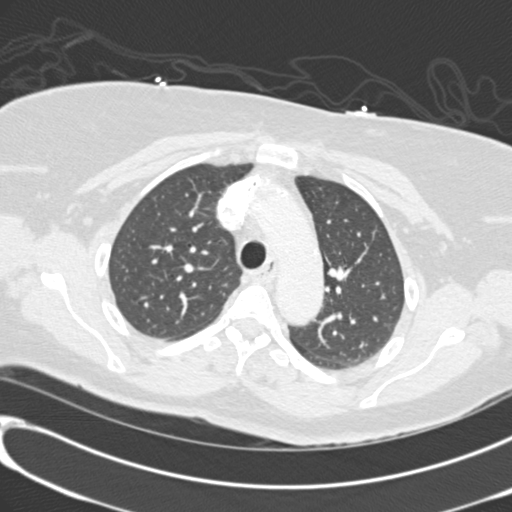
[im 161/218  soft-tissue]
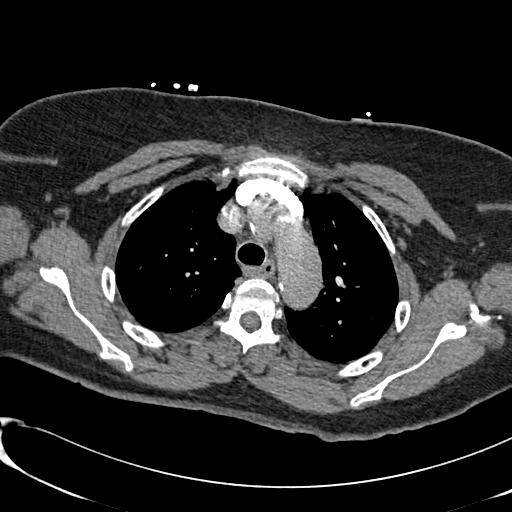
[im 180/218  lung]
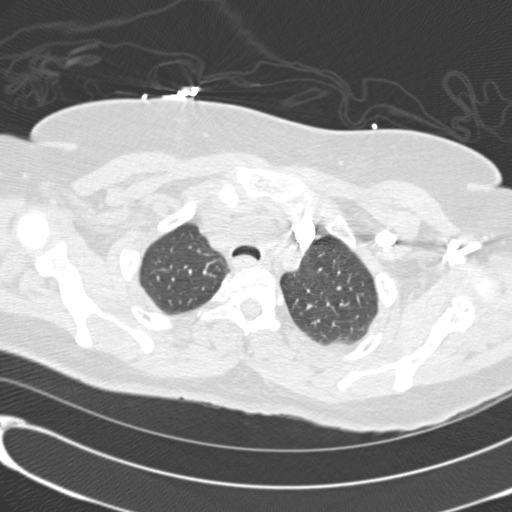
[im 189/218  soft-tissue]
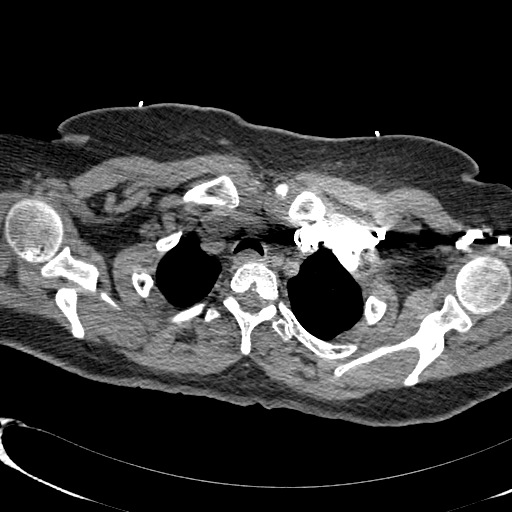
[im 208/218  lung]
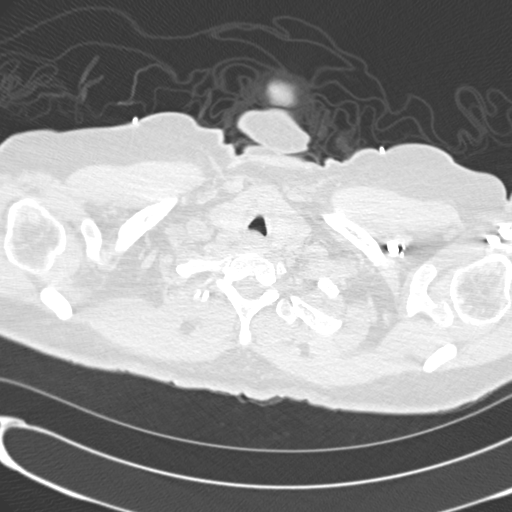

[Series 604: cor · coronal · 0.67mm/px · 3 of 98 slices shown]
[im 25/98  soft-tissue]
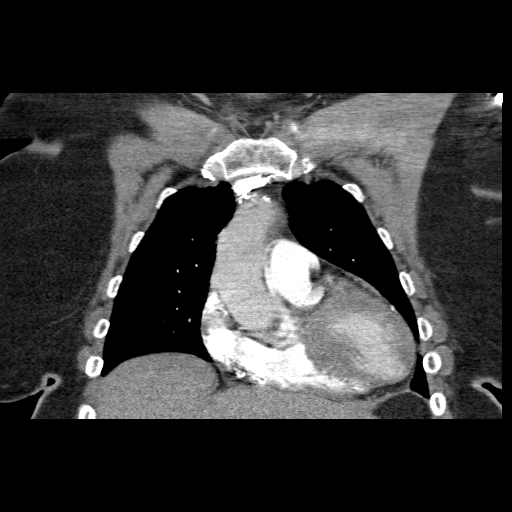
[im 49/98  soft-tissue]
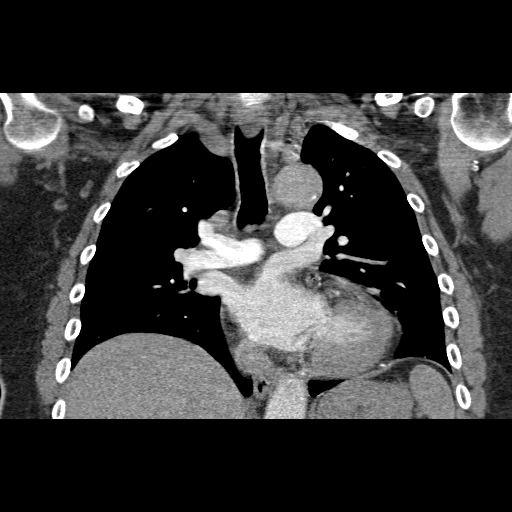
[im 73/98  soft-tissue]
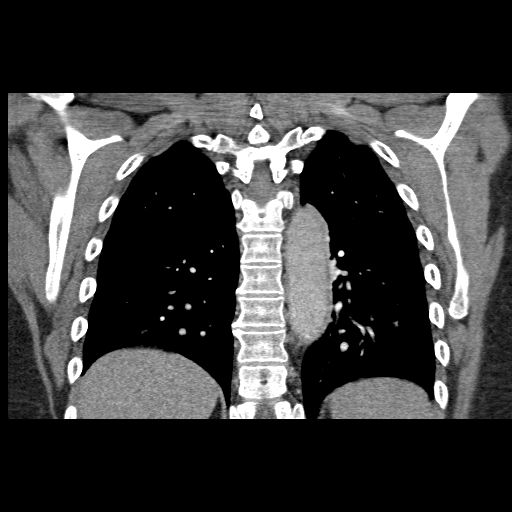

[18 of 46 positions shown; findings below may reference images not displayed]

After the administration of contrast the patient experienced a
transient episode of unresponsiveness which resolved without
dedicated intervention was transported back to the emergency
department. The patient remained hemodynamically stable throughout
this episode of unresponsiveness.

Contrast:  100 [8S]
Vascular Findings:

There is adequate opacification of the pulmonary vasculature in the
main pulmonary artery measuring 239 HU.  There are no discrete
filling defects within the main or major segmental branches of the
pulmonary arteries to suggest acute pulmonary embolism.  Evaluation
of the distal subsegmental pulmonary arteries is degraded secondary
to suboptimal vessel opacification.  Caliber of the main pulmonary
artery is borderline enlarged measuring 3.2 mm in greatest
transverse axial dimension.

Borderline cardiomegaly.  Coronary artery calcifications.  No
pericardial effusion.  Normal caliber of the thoracic aorta.
Bovine configuration of the aortic arch is suspected though
evaluation of the aortic arch is degraded secondary to suboptimal
vessel opacification and streak artifact from contrast within the
left subclavian and innominate veins.  Scattered minimal
atherosclerotic calcifications within a normal caliber descending
thoracic aorta.  No periaortic stranding.

Nonvascular findings:

Bibasilar ground-glass opacities favored to represent atelectasis.
No focal airspace opacities.  No pleural effusion or pneumothorax.
No pleural effusion or pneumothorax.  Central airways are patent.

Limited early arterial phase evaluation of the upper abdomen
suggests a small hiatal hernia but otherwise unremarkable.

No acute or aggressive osseous abnormalities.  Thoracic spine
degenerative change.
IMPRESSION: 1.  No acute cardiopulmonary disease.  Specifically, negative for
pulmonary embolism to the level of the distal subsegmental
pulmonary arteries.

2.  Likely contrast reaction with transient episode
unresponsiveness.  The patient should receive a steroid prep prior
to future contrast examinations.

Above findings discussed with Dr. BAL at [8S]

## 2011-11-30 MED ORDER — ENOXAPARIN SODIUM 40 MG/0.4ML ~~LOC~~ SOLN
40.0000 mg | SUBCUTANEOUS | Status: DC
  Administered 2011-11-30 – 2011-12-01 (×2): 40 mg via SUBCUTANEOUS
  Filled 2011-11-30 (×3): qty 0.4

## 2011-11-30 MED ORDER — SODIUM CHLORIDE 0.9 % IJ SOLN
3.0000 mL | Freq: Two times a day (BID) | INTRAMUSCULAR | Status: DC
  Administered 2011-11-30 – 2011-12-01 (×2): 3 mL via INTRAVENOUS

## 2011-11-30 MED ORDER — LATANOPROST 0.005 % OP SOLN
1.0000 [drp] | Freq: Every day | OPHTHALMIC | Status: DC
  Administered 2011-12-01 – 2011-12-02 (×2): 1 [drp] via OPHTHALMIC
  Filled 2011-11-30: qty 2.5

## 2011-11-30 MED ORDER — BRIMONIDINE TARTRATE 0.2 % OP SOLN
1.0000 [drp] | Freq: Two times a day (BID) | OPHTHALMIC | Status: DC
  Administered 2011-11-30 – 2011-12-02 (×4): 1 [drp] via OPHTHALMIC
  Filled 2011-11-30: qty 5

## 2011-11-30 MED ORDER — METHYLPREDNISOLONE SODIUM SUCC 125 MG IJ SOLR
125.0000 mg | Freq: Once | INTRAMUSCULAR | Status: AC
  Administered 2011-11-30: 125 mg via INTRAVENOUS
  Filled 2011-11-30: qty 2

## 2011-11-30 MED ORDER — ALBUTEROL SULFATE (5 MG/ML) 0.5% IN NEBU
2.5000 mg | INHALATION_SOLUTION | RESPIRATORY_TRACT | Status: DC
  Administered 2011-11-30 – 2011-12-01 (×4): 2.5 mg via RESPIRATORY_TRACT
  Filled 2011-11-30 (×5): qty 0.5

## 2011-11-30 MED ORDER — METOPROLOL TARTRATE 50 MG PO TABS
50.0000 mg | ORAL_TABLET | Freq: Two times a day (BID) | ORAL | Status: DC
  Administered 2011-11-30: 50 mg via ORAL
  Filled 2011-11-30 (×3): qty 1

## 2011-11-30 MED ORDER — ALBUTEROL SULFATE (5 MG/ML) 0.5% IN NEBU
2.5000 mg | INHALATION_SOLUTION | Freq: Once | RESPIRATORY_TRACT | Status: AC
  Administered 2011-11-30: 2.5 mg via RESPIRATORY_TRACT

## 2011-11-30 MED ORDER — BENAZEPRIL HCL 20 MG PO TABS
20.0000 mg | ORAL_TABLET | Freq: Every day | ORAL | Status: DC
  Administered 2011-12-01: 20 mg via ORAL
  Filled 2011-11-30 (×2): qty 1

## 2011-11-30 MED ORDER — POTASSIUM CHLORIDE CRYS ER 20 MEQ PO TBCR
20.0000 meq | EXTENDED_RELEASE_TABLET | Freq: Every day | ORAL | Status: DC
  Administered 2011-12-01 – 2011-12-02 (×2): 20 meq via ORAL
  Filled 2011-11-30 (×2): qty 1

## 2011-11-30 MED ORDER — IOHEXOL 350 MG/ML SOLN
100.0000 mL | Freq: Once | INTRAVENOUS | Status: AC | PRN
  Administered 2011-11-30: 100 mL via INTRAVENOUS

## 2011-11-30 MED ORDER — PANTOPRAZOLE SODIUM 40 MG PO TBEC
40.0000 mg | DELAYED_RELEASE_TABLET | Freq: Every day | ORAL | Status: DC
  Administered 2011-12-01 – 2011-12-02 (×2): 40 mg via ORAL
  Filled 2011-11-30 (×2): qty 1

## 2011-11-30 MED ORDER — CLONIDINE HCL 0.2 MG PO TABS
0.2000 mg | ORAL_TABLET | Freq: Every day | ORAL | Status: DC
  Administered 2011-11-30 – 2011-12-01 (×2): 0.2 mg via ORAL
  Filled 2011-11-30: qty 1
  Filled 2011-11-30: qty 2

## 2011-11-30 MED ORDER — HYDRALAZINE HCL 20 MG/ML IJ SOLN
10.0000 mg | INTRAMUSCULAR | Status: DC | PRN
  Administered 2011-11-30 – 2011-12-01 (×2): 10 mg via INTRAVENOUS
  Filled 2011-11-30: qty 1
  Filled 2011-11-30: qty 0.5

## 2011-11-30 MED ORDER — ACETAMINOPHEN 325 MG PO TABS: 650.0000 mg | ORAL_TABLET | Freq: Four times a day (QID) | ORAL | Status: DC | PRN

## 2011-11-30 MED ORDER — ASPIRIN EC 81 MG PO TBEC
81.0000 mg | DELAYED_RELEASE_TABLET | Freq: Every day | ORAL | Status: DC
  Administered 2011-12-01 – 2011-12-02 (×2): 81 mg via ORAL
  Filled 2011-11-30 (×2): qty 1

## 2011-11-30 MED ORDER — ASPIRIN 81 MG PO TABS
81.0000 mg | ORAL_TABLET | Freq: Every day | ORAL | Status: DC
Start: 2011-11-30 — End: 2011-11-30

## 2011-11-30 MED ORDER — ACETAMINOPHEN 650 MG RE SUPP: 650.0000 mg | Freq: Four times a day (QID) | RECTAL | Status: DC | PRN

## 2011-11-30 MED ORDER — LORATADINE 10 MG PO TABS
10.0000 mg | ORAL_TABLET | Freq: Every day | ORAL | Status: DC
  Administered 2011-12-01 – 2011-12-02 (×2): 10 mg via ORAL
  Filled 2011-11-30 (×2): qty 1

## 2011-11-30 MED ORDER — IPRATROPIUM BROMIDE 0.02 % IN SOLN
0.5000 mg | RESPIRATORY_TRACT | Status: DC
  Administered 2011-11-30 – 2011-12-01 (×4): 0.5 mg via RESPIRATORY_TRACT
  Filled 2011-11-30 (×4): qty 2.5

## 2011-11-30 NOTE — Progress Notes (Signed)
VASCULAR LAB PRELIMINARY  PRELIMINARY  PRELIMINARY  PRELIMINARY  Left lower extremity venous duplex completed.    Preliminary report:  Left:  No evidence of DVT, superficial thrombosis, or Baker's cyst.  Terance Hart, RVT 11/30/2011, 6:27 PM

## 2011-11-30 NOTE — ED Provider Notes (Addendum)
History     CSN: [id number]  Arrival date & time 11/30/11  1320   First MD Initiated Contact with Patient 11/30/11 1349      Chief Complaint  Patient presents with  . Chest Pain    (Consider location/radiation/quality/duration/timing/severity/associated sxs/prior treatment) HPI  H/o HTN, HLD, borderline DM, +Smoker pw chest pain. Pt with intermittent chest pain x weeks. States pain was worse this morning. +Palpitations this morning. Chest pain began approximately 1130 today. Severe sharp chest pain without radiation, 10/10 at onset. Denies N/V. +Diaphoresis. Has never experienced pain this bad previously. +SOB as well. Denies fever/chills/cough. No sick contacts. No h/o anxiety but has felt similar pain since her son's death a few weeks ago. Denies h/o VTE in self or family. No recent hosp/surg/immob. No h/o cancer. Denies exogenous hormone use, she has baseline leg swelling L >R and states that it usually is better after elevation, but now persistent. Resolved after EMS ntg and asa  No h/o stress test No cardiologist RF CAD as above +fmhx father-50s  PMD- Dr. Erasmo Downer with [facility]  ED Notes, ED Provider Notes from 11/30/11 0000 to 11/30/11 13:24:59       Abram Sander, RN 11/30/2011 13:22      Onset today lightheaded feeling like passing out and left sided chest pain. EMS called given 324mg  aspirin and 1 nitro SL Chest pain currently 0/10. ax4       Past Medical History  Diagnosis Date  . Hypertension   . Diabetes mellitus   . Glaucoma     History reviewed. No pertinent past surgical history.  No family history on file.  History  Substance Use Topics  . Smoking status: Never Smoker   . Smokeless tobacco: Not on file  . Alcohol Use: No    OB History    Grav Para Term Preterm Abortions TAB SAB Ect Mult Living                  Review of Systems  All other systems reviewed and are negative.   except as noted HPI  Allergies  Review of patient's  allergies indicates no known allergies.  Home Medications   Current Outpatient Rx  Name Route Sig Dispense Refill  . ALBUTEROL SULFATE HFA 108 (90 BASE) MCG/ACT IN AERS Inhalation Inhale 1 puff into the lungs every 6 (six) hours as needed. Shortness of breath    . ASPIRIN 81 MG PO TABS Oral Take 81 mg by mouth daily.    Marland Kitchen BENAZEPRIL HCL 20 MG PO TABS Oral Take 20 mg by mouth daily.    Marland Kitchen BRIMONIDINE TARTRATE 0.2 % OP SOLN Both Eyes Place 1 drop into both eyes 2 (two) times daily.    Marland Kitchen CETIRIZINE HCL 10 MG PO TABS Oral Take 10 mg by mouth daily as needed. allergies    . CLONIDINE HCL 0.2 MG PO TABS Oral Take 0.2 mg by mouth daily.    Marland Kitchen LATANOPROST 0.005 % OP SOLN Both Eyes Place 1 drop into both eyes.    Marland Kitchen POTASSIUM CHLORIDE CRYS ER 20 MEQ PO TBCR Oral Take 20 mEq by mouth daily.      BP 185/95  Pulse 74  Temp(Src) 98 F (36.7 C) (Oral)  Resp 14  SpO2 99%  Physical Exam  Nursing note and vitals reviewed. Constitutional: She is oriented to person, place, and time. She appears well-developed.  HENT:  Head: Atraumatic.  Mouth/Throat: Oropharynx is clear and moist.  Eyes: Conjunctivae and EOM are  normal. Pupils are equal, round, and reactive to light.  Neck: Normal range of motion. Neck supple.  Cardiovascular: Normal rate, regular rhythm, normal heart sounds and intact distal pulses.   Pulmonary/Chest: Effort normal. No respiratory distress. She has wheezes. She has no rales.       Diffuse exp wheeze  Abdominal: Soft. She exhibits no distension. There is no tenderness. There is no rebound and no guarding.  Musculoskeletal: Normal range of motion. She exhibits edema. She exhibits no tenderness.       3+ pitting edema L >R   Neurological: She is alert and oriented to person, place, and time.  Skin: Skin is warm and dry. No rash noted.  Psychiatric: She has a normal mood and affect.    Date: 11/30/2011  Rate: 76  Rhythm: normal sinus rhythm  QRS Axis: left  Intervals: normal   ST/T Wave abnormalities: normal  Conduction Disutrbances:none  Narrative Interpretation:   Old EKG Reviewed: none available   ED Course  Procedures (including critical care time)  Labs Reviewed  BASIC METABOLIC PANEL - Abnormal; Notable for the following:    Creatinine, Ser 1.12 (*)    GFR calc non Af Amer 52 (*)    GFR calc Af Amer 60 (*)    All other components within normal limits  PRO B NATRIURETIC PEPTIDE - Abnormal; Notable for the following:    Pro B Natriuretic peptide (BNP) 125.5 (*)    All other components within normal limits  D-DIMER, QUANTITATIVE - Abnormal; Notable for the following:    D-Dimer, Quant 0.54 (*)    All other components within normal limits  CBC  DIFFERENTIAL  TROPONIN I   Dg Chest 2 View  11/30/2011  *RADIOLOGY REPORT*  Clinical Data: Chest pain and shortness of breath.  Diabetes and hypertension.  CHEST - 2 VIEW  Comparison:  None.  Findings:  The heart size and mediastinal contours are within normal limits.  Both lungs are clear.  The visualized skeletal structures are unremarkable.  IMPRESSION: No active cardiopulmonary disease.  Original Report Authenticated By: Danae Orleans, M.D.     1. Chest pain   2. Wheezing on auscultation     MDM  60yoF h/o , hyperlipidemia, borderline diabetes, smoker with positive family history for early coronary artery disease presents with chest pain. She does have wheezing on exam. Differential diagnosis includes acute coronary syndrome, new COPD/asthma, urinary embolism also a consideration given her asymmetrical leg swelling. She does state that her left lower extremity is usually slightly larger than her right lower extremity, and also usually improved with elevation and not improved today. There may also be a component of stress or anxiety as she started having symptoms shortly after her son passed away and with his recent birthday. Her d-dimer is elevated. A CTA of her chest has been ordered. VUS LLE pending.  I  discussed her case with inpatient admitting team on the teaching service. Will evaluate the patient in the emergency department for admission, further workup and evaluation. She is feeling the same after DuoNeb and Solu-Medrol. She continues to have minimal wheezing throughout all lung fields on exam.  D/W Dr. Manus Gunning-- f/u CTA        Forbes Cellar, MD 11/30/11 1623  Forbes Cellar, MD 11/30/11 1623

## 2011-11-30 NOTE — ED Notes (Signed)
Patient resting comfortably on stretcher watching TV with family member at bedside.

## 2011-11-30 NOTE — ED Notes (Signed)
Onset today lightheaded feeling like passing out and left sided chest pain.  EMS called given 324mg  aspirin and 1 nitro SL Chest pain currently 0/10. ax4

## 2011-11-30 NOTE — ED Notes (Signed)
MD at bedside. 

## 2011-11-30 NOTE — ED Notes (Signed)
Patient resting comfortably on stretcher states pain 0/10.

## 2011-11-30 NOTE — ED Notes (Signed)
Patient ax4 states feels better and hungry patient now eating.

## 2011-11-30 NOTE — H&P (Signed)
Hospital Admission Note Date: 11/30/2011  Patient name: Westlynn Fifer Medical record number: [id number] Date of birth: 1951/04/06 Age: 61 y.o. Gender: female PCP: [facility]  Medical Service: Internal Medicine Teaching Service  Attending physician: Lina Sayre    1st Contact: Janalyn Harder   Pager: [phone] 2nd Contact: Lyn Hollingshead   Pager: [phone] After 5 pm or weekends: 1st Contact:      Pager: [phone] 2nd Contact:      Pager: [phone]  Chief Complaint: Chest pain  History of Present Illness: The patient is a 61 yo woman, history of HTN, HL, ?DM, smoking, and FH of CAD, presenting with chest pain.  Around 11:30 am today, the patient experienced chest pain, described as a sharp mid-sternal pain, 10/10 in severity, occurring at rest, subsiding within 5-10 minutes at rest, associated with diaphoresis and SOB but no nausea/vomiting.  EMS was called, and the patient was given aspirin 324 and 1 SL nitro, and brought to [facility].  The patient notes similar episodes of chest pain off and on for the past 2 weeks, occurring 3-4 times/week, but those pains were less severe and not associated with SOB.  Of note, the patient notes significant stress lately, with the recent death of her son (lymphoma).  She notes an occasional cough productive of white-yellow sputum, which has been stable for many months, and which has not increased in frequency.  She notes baseline bilateral LE edema, but notes that it has been worsening over the last few days, L>R (which is her baseline).  She notes no fevers, chills, rhinorrhea, nausea, vomiting, abd pain, diarrhea, constipation, dysuria.  She has no history of obstructive lung disease, but uses an albuterol inhaler as needed for occasional "bronchitis".  Meds:  (Not in a hospital admission)  Allergies: Allergies as of 11/30/2011 - Review Complete 11/30/2011  Allergen Reaction Noted  . Contrast media (iodinated diagnostic agents) Other (See Comments)  11/30/2011   Past Medical History  Diagnosis Date  . Hypertension   . Diabetes mellitus     diagnosed at age 66, on a "pill" (?metformin), but now diet-controlled  . Glaucoma   . Hyperlipidemia     managed with lifestyle modification  . Tobacco abuse     No PFT's or diagnosis of COPD   Past Surgical History  Procedure Date  . Abdominal hysterectomy 1984   Family History  Problem Relation Age of Onset  . Heart attack Father 44  . Lymphoma Son   . Colon cancer Other    History   Social History  . Marital Status: Married    Spouse Name: N/A    Number of Children: N/A  . Years of Education: N/A   Occupational History  . Not on file.   Social History Main Topics  . Smoking status: Current Everyday Smoker -- 0.5 packs/day for 40 years    Types: Cigarettes  . Smokeless tobacco: Not on file  . Alcohol Use: 0.5 - 1.0 oz/week    1-2 drink(s) per week  . Drug Use: No  . Sexually Active: Not on file   Other Topics Concern  . Not on file   Social History Narrative   Works as a Sports coach with [employer]  Has insurance.    Review of Systems: General: no fevers, chills, changes in weight, changes in appetite Skin: no rash HEENT: no blurry vision, hearing changes, sore throat Pulm: see HPI CV: see HPI Abd: no abdominal pain, nausea/vomiting, diarrhea/constipation GU: no dysuria, hematuria,  polyuria Ext: no arthralgias, myalgias Neuro: no weakness, numbness, or tingling   Physical Exam: Blood pressure 177/99, pulse 81, temperature 98.1 F (36.7 C), temperature source Oral, resp. rate 15, SpO2 100.00%. General: alert, responds to questions with 1-word answers, asks [place] to leave her alone HEENT: pupils equal round and reactive to light, vision grossly intact, oropharynx clear and non-erythematous  Neck: supple, no lymphadenopathy Lungs: normal work of respiration, mild bilateral wheeze heard throughout lung fields Heart: regular rate and rhythm, no murmurs, gallops,  or rubs Abdomen: soft, non-tender, non-distended, normal bowel sounds Extremities: bilateral 1+ pitting edema, possibly slightly more swelling in left leg Neurologic: alert & oriented X3, cranial nerves II-XII intact, strength grossly intact, sensation intact to light touch   Lab results: Basic Metabolic Panel:  Basename 11/30/11 1351  NA 141  K 3.8  CL 101  CO2 32  GLUCOSE 85  BUN 19  CREATININE 1.12*  CALCIUM 9.4  MG --  PHOS --   CBC:  Basename 11/30/11 1351  WBC 6.5  NEUTROABS 4.1  HGB 12.6  HCT 38.8  MCV 93.0  PLT 212   Cardiac Enzymes:  Basename 11/30/11 1352  CKTOTAL --  CKMB --  CKMBINDEX --  TROPONINI <0.30   BNP:  Basename 11/30/11 1352  PROBNP 125.5*   D-Dimer:  Basename 11/30/11 1520  DDIMER 0.54*    Imaging results:  Dg Chest 2 View  11/30/2011  *RADIOLOGY REPORT*  Clinical Data: Chest pain and shortness of breath.  Diabetes and hypertension.  CHEST - 2 VIEW  Comparison:  None.  Findings:  The heart size and mediastinal contours are within normal limits.  Both lungs are clear.  The visualized skeletal structures are unremarkable.  IMPRESSION: No active cardiopulmonary disease.  Original Report Authenticated By: Danae Orleans, M.D.    Other results: EKG: mild J-point elevations in anterior leads (no old EKG for comparison), TWI in III  Assessment & Plan by Problem: The patient is a 61 yo woman, history of HTN, HL, smoking, +FH of MI, and DM, presenting with atypical chest pain.  # Atypical chest pain - Patient presents with mid-sternal, sharp chest pain, not associated with exertion, but associated with SOB and diaphoresis, with CAD risk factors of HTN, HL, smoking, FH, and age > 11, though no known history of CAD, with normal EKG and negative CXR.  The patient also notes significant stress for the last couple of weeks due to the death of her son.  Differential includes ACS vs anxiety vs panic attack vs GERD vs PE (Wells score = 4.5, mod  probability, mildly elevated d-dimer). -risk stratification with Hb A1C, lipid panel -cycle CE's x3 -encourage smoking cessation -start protonix -CTA angiogram and LE dopplers ordered in ED  # Hypertension - BP's currently elevated -continue home benazepril 20, clonidine 0.2 -hydralazine prn -start metoprolol 50 BID  # LE edema - possibly secondary to chronic venous insufficiency.  No history of CHF, and BNP negative. -consider starting lasix at discharge -will defer echo, as patient with normal BNP and no history of CHF -LE dopplers for asymmetric swelling  # Tobacco abuse -encourage smoking cessation -nicotine patch prn  # Prophy - lovenox  SignedJanalyn Harder 11/30/2011, 5:05 PM

## 2011-11-30 NOTE — ED Notes (Signed)
Patient states onset few weeks ago increased edema bilateral feet and increased stress at home and work.  Today patient at working developed light headedness and feeling like passing out, and left sided chest pain.  Upon arrival to ED Chest pain resolved and states feels better ax4 answering and following commands appropriate.  Airway intact bilateral equal chest rise and fall.  Bilateral feet +2 edema pedal pulses +1 bilateral skin warm and dry.

## 2011-11-30 NOTE — ED Notes (Signed)
Lower extremity venous will be completed shortly at bedside by tech who called and is on her way.

## 2011-11-30 NOTE — ED Notes (Signed)
Vascular at bedside

## 2011-11-30 NOTE — ED Notes (Signed)
Family at bedside. 

## 2011-11-30 NOTE — ED Provider Notes (Signed)
Call to CT as patient went unresponsive after contrast administration. The patient states that patient was not responding for a few minutes after medially after administration of contrast. My arrival she is awake and responding to stimuli. Her vital signs are stable. Her EKG is unchanged from previous. Her lungs are clear bilaterally there is no rash, angioedema, or oropharynx swelling. BP 196/115  Pulse 90  Temp(Src) 98.1 F (36.7 C) (Oral)  Resp 20  SpO2 99%  Patient states that she felt a warm feeling in her chest after the noncontrast denies any additional difficulty breathing or chest pain. Outpatient clinic residents updated.  Date: 11/30/2011  Rate: 94  Rhythm: normal sinus rhythm  QRS Axis: normal  Intervals: normal  ST/T Wave abnormalities: normal  Conduction Disutrbances:none  Narrative Interpretation:   Old EKG Reviewed: unchanged    Glynn Octave, MD 11/30/11 1724

## 2011-11-30 NOTE — ED Notes (Signed)
Low sodium tray ordered

## 2011-11-30 NOTE — ED Notes (Signed)
Called floor for report and nurse assisting another patient will call back shortly

## 2011-11-30 NOTE — ED Notes (Signed)
Pt was in CT and became less responsive per CT staff.  Pt stated she felt like it was hard to breath and felt warmness in her chest.  Pt also coughing but pt stated that was from her previous breathing treatment.  Pt transported back to trt room with RN and MD.  Pt states she is feeling back to baseline at this time.

## 2011-12-01 ENCOUNTER — Encounter (HOSPITAL_COMMUNITY): Payer: Self-pay | Admitting: *Deleted

## 2011-12-01 DIAGNOSIS — I10 Essential (primary) hypertension: Secondary | ICD-10-CM

## 2011-12-01 LAB — GLUCOSE, CAPILLARY
Glucose-Capillary: 211 mg/dL — ABNORMAL HIGH (ref 70–99)
Glucose-Capillary: 102 mg/dL — ABNORMAL HIGH (ref 70–99)
Glucose-Capillary: 115 mg/dL — ABNORMAL HIGH (ref 70–99)

## 2011-12-01 LAB — CARDIAC PANEL(CRET KIN+CKTOT+MB+TROPI)
CK, MB: 1.6 ng/mL (ref 0.3–4.0)
Relative Index: INVALID (ref 0.0–2.5)
Total CK: 51 U/L (ref 7–177)
Troponin I: <0.3 ng/mL (ref <0.30)

## 2011-12-01 LAB — LIPID PANEL
HDL: 93 mg/dL (ref >39)
Total CHOL/HDL Ratio: 2.1 RATIO
VLDL: 13 mg/dL (ref 0–40)

## 2011-12-01 LAB — HEMOGLOBIN A1C: Mean Plasma Glucose: 120 mg/dL — ABNORMAL HIGH (ref <117)

## 2011-12-01 MED ORDER — CLONIDINE HCL 0.2 MG PO TABS
0.2000 mg | ORAL_TABLET | Freq: Two times a day (BID) | ORAL | Status: DC
  Administered 2011-12-01 – 2011-12-02 (×2): 0.2 mg via ORAL
  Filled 2011-12-01 (×4): qty 1

## 2011-12-01 MED ORDER — IPRATROPIUM BROMIDE 0.02 % IN SOLN: 0.5000 mg | Freq: Two times a day (BID) | RESPIRATORY_TRACT | Status: DC

## 2011-12-01 MED ORDER — ALBUTEROL SULFATE (5 MG/ML) 0.5% IN NEBU: 2.5000 mg | INHALATION_SOLUTION | Freq: Two times a day (BID) | RESPIRATORY_TRACT | Status: DC

## 2011-12-01 MED ORDER — IPRATROPIUM BROMIDE 0.02 % IN SOLN
0.5000 mg | Freq: Two times a day (BID) | RESPIRATORY_TRACT | Status: DC
  Administered 2011-12-01 (×2): 0.5 mg via RESPIRATORY_TRACT
  Filled 2011-12-01 (×3): qty 2.5

## 2011-12-01 MED ORDER — FUROSEMIDE 40 MG PO TABS
40.0000 mg | ORAL_TABLET | Freq: Every day | ORAL | Status: DC
  Administered 2011-12-01 – 2011-12-02 (×2): 40 mg via ORAL
  Filled 2011-12-01 (×2): qty 1

## 2011-12-01 MED ORDER — IPRATROPIUM BROMIDE 0.02 % IN SOLN: 0.5000 mg | RESPIRATORY_TRACT | Status: DC | PRN

## 2011-12-01 MED ORDER — ALBUTEROL SULFATE (5 MG/ML) 0.5% IN NEBU: 2.5000 mg | INHALATION_SOLUTION | RESPIRATORY_TRACT | Status: DC | PRN

## 2011-12-01 MED ORDER — CLONIDINE HCL 0.2 MG PO TABS
0.2000 mg | ORAL_TABLET | Freq: Two times a day (BID) | ORAL | Status: DC
  Filled 2011-12-01 (×2): qty 1

## 2011-12-01 MED ORDER — ALBUTEROL SULFATE (5 MG/ML) 0.5% IN NEBU
2.5000 mg | INHALATION_SOLUTION | Freq: Two times a day (BID) | RESPIRATORY_TRACT | Status: DC
  Administered 2011-12-01 (×2): 2.5 mg via RESPIRATORY_TRACT
  Filled 2011-12-01 (×3): qty 0.5

## 2011-12-01 MED ORDER — ALBUTEROL SULFATE (5 MG/ML) 0.5% IN NEBU: 2.5000 mg | INHALATION_SOLUTION | RESPIRATORY_TRACT | Status: DC

## 2011-12-01 MED ORDER — METOPROLOL TARTRATE 100 MG PO TABS
100.0000 mg | ORAL_TABLET | Freq: Two times a day (BID) | ORAL | Status: DC
  Administered 2011-12-01 (×2): 100 mg via ORAL
  Filled 2011-12-01 (×4): qty 1

## 2011-12-01 MED ORDER — NICOTINE 21 MG/24HR TD PT24
21.0000 mg | MEDICATED_PATCH | Freq: Every day | TRANSDERMAL | Status: DC | PRN
  Filled 2011-12-01: qty 1

## 2011-12-01 NOTE — Progress Notes (Signed)
Subjective: Patient notes 1-2 more episodes of similar chest tightness overnight when thinking about her son's death.  Telemetry overnight unremarkable.  CE's negative x3.  The patient's BP's were elevated overnight, despite addition of metoprolol 50.  Objective: Vital signs in last 24 hours: Filed Vitals:   12/01/11 0500 12/01/11 0700 12/01/11 0753 12/01/11 1154  BP: 189/101 218/128  152/97  Pulse: 69     Temp: 97.8 F (36.6 C)     TempSrc:      Resp: 20     SpO2: 97%  97%    Weight change:  No intake or output data in the 24 hours ending 12/01/11 1340 Physical Exam: General: alert, cooperative, and in no apparent distress HEENT: pupils equal round and reactive to light, vision grossly intact, oropharynx clear and non-erythematous  Neck: supple, no lymphadenopathy, no JVD Lungs: clear to ascultation bilaterally, normal work of respiration, no wheezes, rales, ronchi Heart: regular rate and rhythm, no murmurs, gallops, or rubs Abdomen: soft, non-tender, non-distended, normal bowel sounds Extremities: 2+ DP/PT pulses bilaterally, no cyanosis, clubbing, or edema Neurologic: alert & oriented X3, cranial nerves II-XII intact, strength grossly intact, sensation intact to light touch  Lab Results: Basic Metabolic Panel:  Lab 11/30/11 9562  NA 141  K 3.8  CL 101  CO2 32  GLUCOSE 85  BUN 19  CREATININE 1.12*  CALCIUM 9.4  MG --  PHOS --   CBC:  Lab 11/30/11 1351  WBC 6.5  NEUTROABS 4.1  HGB 12.6  HCT 38.8  MCV 93.0  PLT 212   Cardiac Enzymes:  Lab 12/01/11 0356 11/30/11 1958 11/30/11 1352  CKTOTAL 51 36 --  CKMB 1.6 1.5 --  CKMBINDEX -- -- --  TROPONINI <0.30 <0.30 <0.30   BNP:  Lab 11/30/11 1352  PROBNP 125.5*   D-Dimer:  Lab 11/30/11 1520  DDIMER 0.54*   CBG:  Lab 12/01/11 1113 12/01/11 0729 11/30/11 2121  GLUCAP 102* 112* 211*   Hemoglobin A1C:  Lab 11/30/11 1957  HGBA1C 5.8*   Fasting Lipid Panel:  Lab 12/01/11 0356  CHOL 198  HDL 93    LDLCALC 92  TRIG 65  CHOLHDL 2.1  LDLDIRECT --   Thyroid Function Tests:  Lab 11/30/11 1957  TSH 0.618  T4TOTAL --  FREET4 --  T3FREE --  THYROIDAB --   Studies/Results: Dg Chest 2 View  11/30/2011  *RADIOLOGY REPORT*  Clinical Data: Chest pain and shortness of breath.  Diabetes and hypertension.  CHEST - 2 VIEW  Comparison:  None.  Findings:  The heart size and mediastinal contours are within normal limits.  Both lungs are clear.  The visualized skeletal structures are unremarkable.  IMPRESSION: No active cardiopulmonary disease.  Original Report Authenticated By: Danae Orleans, M.D.   Ct Angio Chest W/cm &/or Wo Cm  11/30/2011  *RADIOLOGY REPORT*  Clinical Data: Hypertension, borderline elevated D-dimer  CT ANGIOGRAPHY CHEST  Technique:  Multidetector CT imaging of the chest using the standard protocol during bolus administration of intravenous contrast. Multiplanar reconstructed images including MIPs were obtained and reviewed to evaluate the vascular anatomy.  After the administration of contrast the patient experienced a transient episode of unresponsiveness which resolved without dedicated intervention was transported back to the emergency department. The patient remained hemodynamically stable throughout this episode of unresponsiveness.  Contrast:  100 Omnipaque-300  Comparison: Chest radiograph - earlier same day  Vascular Findings:  There is adequate opacification of the pulmonary vasculature in the main pulmonary artery measuring 239 HU.  There are no discrete filling defects within the main or major segmental branches of the pulmonary arteries to suggest acute pulmonary embolism.  Evaluation of the distal subsegmental pulmonary arteries is degraded secondary to suboptimal vessel opacification.  Caliber of the main pulmonary artery is borderline enlarged measuring 3.2 mm in greatest transverse axial dimension.  Borderline cardiomegaly.  Coronary artery calcifications.  No pericardial  effusion.  Normal caliber of the thoracic aorta. Bovine configuration of the aortic arch is suspected though evaluation of the aortic arch is degraded secondary to suboptimal vessel opacification and streak artifact from contrast within the left subclavian and innominate veins.  Scattered minimal atherosclerotic calcifications within a normal caliber descending thoracic aorta.  No periaortic stranding.  Nonvascular findings:  Bibasilar ground-glass opacities favored to represent atelectasis. No focal airspace opacities.  No pleural effusion or pneumothorax. No pleural effusion or pneumothorax.  Central airways are patent.  Limited early arterial phase evaluation of the upper abdomen suggests a small hiatal hernia but otherwise unremarkable.  No acute or aggressive osseous abnormalities.  Thoracic spine degenerative change.  IMPRESSION: 1.  No acute cardiopulmonary disease.  Specifically, negative for pulmonary embolism to the level of the distal subsegmental pulmonary arteries.  2.  Likely contrast reaction with transient episode unresponsiveness.  The patient should receive a steroid prep prior to future contrast examinations.  Above findings discussed with Dr. Manus Gunning at 1727  Original Report Authenticated By: Waynard Reeds, M.D.   Medications: I have reviewed the patient's current medications. Scheduled Meds:   . albuterol  2.5 mg Nebulization Once  . ipratropium  0.5 mg Nebulization BID   And  . albuterol  2.5 mg Nebulization BID  . aspirin EC  81 mg Oral Daily  . benazepril  20 mg Oral Daily  . brimonidine  1 drop Both Eyes BID  . cloNIDine  0.2 mg Oral BID  . enoxaparin  40 mg Subcutaneous Q24H  . furosemide  40 mg Oral Daily  . latanoprost  1 drop Both Eyes Daily  . loratadine  10 mg Oral Daily  . methylPREDNISolone (SOLU-MEDROL) injection  125 mg Intravenous Once  . metoprolol tartrate  100 mg Oral BID  . pantoprazole  40 mg Oral Q1200  . potassium chloride SA  20 mEq Oral Daily  .  sodium chloride  3 mL Intravenous Q12H  . DISCONTD: albuterol  2.5 mg Nebulization Q4H  . DISCONTD: albuterol  2.5 mg Nebulization Q4H  . DISCONTD: albuterol  2.5 mg Nebulization BID  . DISCONTD: aspirin  81 mg Oral Daily  . DISCONTD: cloNIDine  0.2 mg Oral Daily  . DISCONTD: ipratropium  0.5 mg Nebulization Q4H  . DISCONTD: ipratropium  0.5 mg Nebulization BID  . DISCONTD: ipratropium  0.5 mg Nebulization BID  . DISCONTD: metoprolol tartrate  50 mg Oral BID   Continuous Infusions:  PRN Meds:.acetaminophen, acetaminophen, albuterol, hydrALAZINE, iohexol, ipratropium  Assessment/Plan: The patient is a 61 yo woman, history of HTN, HL, smoking, +FH of MI, and DM, presenting with atypical chest pain.   # Atypical chest pain - Ruled out for ACS with normal EKG and CE's neg x3, though still with significant CAD risk factors of HTN, smoking, FH, and age > 23.  The patient's chest pain most likely represents anxiety, given its temporal association with her son's death.  Differential also includes panic attack vs GERD. -risk stratification: A1C = 5.8, LDL = 92 -cycle CE's x3  -encourage smoking cessation  -continue protonix  -CTA and  LE dopplers negative   # Hypertension - BP's continued to be elevated overnight, likely representing a combination of true hypertension and stress.  -continue home benazepril 20, clonidine 0.2 mg BID -increase metoprolol to 100 BID -re-adding home lisinopril (initially was not on patient's home med list) -hydralazine prn -if still elevated, consider verapamil  # LE edema - possibly secondary to chronic venous insufficiency. No history of CHF, and BNP negative.  LE dopplers showed no DVT. -resume patient's home lasix. -will defer echo, as patient with normal BNP and no history of CHF   # Tobacco abuse  -encourage smoking cessation  -nicotine patch prn   # Prophy - lovenox  # Dispo - likely discharge home tomorrow if BP's well-controlled, with consideration  for outpatient stress test   LOS: 1 day   Imanni Burdine 12/01/2011, 1:40 PM

## 2011-12-01 NOTE — Progress Notes (Signed)
Attending Note    I have seen and examined [name] and discussed her care and evaluation with the housestaff. She has ruled out for MI, PE and aortic dissection. I would arrange cardiac radionuclide scan as outpatient since she has risk for CAD with smoking and HTN. Her exam offers no clues to cause and of course, her anxiety over her son's recent death may be sufficient cause. Her BP will need somewhat better control before discharge. Patricia Ewing

## 2011-12-01 NOTE — Progress Notes (Signed)
Patient complained of 9 out of 10 chest pain.  2 liters O2 via [place] applied.  Pt then laid back in bed and stated the pain was gone she rated her pain a 0 out of 10.  BP=168/101 HR=62  EKG= NORMAL SINUS. Patient then had episode of belching. Will continue to monitor patient

## 2011-12-01 NOTE — Progress Notes (Signed)
B/p 189/101 10mg  hydralazine given returned to room pt c/o chest pain returned to room pt. Crying thinking about son comfort given b/p 229/141 EKG done will cont. To monitor

## 2011-12-02 LAB — BASIC METABOLIC PANEL
BUN: 22 mg/dL (ref 6–23)
CO2: 28 mEq/L (ref 19–32)
Chloride: 101 mEq/L (ref 96–112)
GFR calc Af Amer: 62 mL/min — ABNORMAL LOW (ref >90)
Potassium: 3.7 mEq/L (ref 3.5–5.1)

## 2011-12-02 MED ORDER — FUROSEMIDE 40 MG PO TABS: 40.0000 mg | ORAL_TABLET | Freq: Every day | ORAL | Status: DC

## 2011-12-02 MED ORDER — METOPROLOL TARTRATE 50 MG PO TABS
50.0000 mg | ORAL_TABLET | Freq: Two times a day (BID) | ORAL | Status: DC
  Administered 2011-12-02: 50 mg via ORAL
  Filled 2011-12-02 (×2): qty 1

## 2011-12-02 MED ORDER — BENAZEPRIL HCL 40 MG PO TABS
40.0000 mg | ORAL_TABLET | Freq: Every day | ORAL | Status: DC
  Administered 2011-12-02: 40 mg via ORAL
  Filled 2011-12-02: qty 1

## 2011-12-02 MED ORDER — METOPROLOL TARTRATE 50 MG PO TABS: 50.0000 mg | ORAL_TABLET | Freq: Two times a day (BID) | ORAL | Status: DC

## 2011-12-02 MED ORDER — BENAZEPRIL HCL 40 MG PO TABS: 40.0000 mg | ORAL_TABLET | Freq: Every day | ORAL | Status: DC

## 2011-12-02 MED ORDER — NICOTINE 21 MG/24HR TD PT24: 1.0000 | MEDICATED_PATCH | Freq: Every day | TRANSDERMAL | Status: AC | PRN

## 2011-12-02 NOTE — Discharge Instructions (Signed)
You were hospitalized due to chest pain.  Your chest pain is likely due to anxiety.  However, you do have several risk factors for heart disease, including tobacco use, high blood pressure, and a family history of a heart attack.  Although you were not having a heart attack during this hospitalization, your Primary Care Doctor may want to refer you for an Exercise Stress Test to further evaluate your heart.  We also found that your blood pressure was high during this hospitalization.  We are starting you on a new medication called Metoprolol.  Take 1 tablet twice per day.

## 2011-12-02 NOTE — Discharge Summary (Signed)
DISCHARGE SUMMARY  Patient Name:  Patricia Ewing  MRN: [id number]  PCP: Yates Decamp MD, [place], [place]  DOB:  Sep 09, 1950     CSN NUMBER :   Date of Admission:  11/30/2011  Date of Discharge:  12/02/2011      Attending Physician: Dr. Lina Sayre, MD         DISCHARGE DIAGNOSES: 1.  Acute chest pain- most likely 2/2 Anxiety and grieving. 2.  HTN (hypertension) 3.  Bilateral leg edema 4.  Depression/Greiving 5. IV contrast reaction 6. Tobacco dependence   DISPOSITION AND FOLLOW-UP: Sanii Kukla is to follow-up with the listed providers as detailed below, at which time, will evaluate patient for chest pain and consider referral for grief Counseling if she hasn't done it yet. Also consider for cardiac stress testing if chest pain continues.  Follow-up Information    Follow up with Elmo Putt, MD on 12/07/2011. (3:45 pm)    Contact information:   [address]   [place] [address] [phone]         Discharge Orders    Future Orders Please Complete By Expires   Diet - low sodium heart healthy      Increase activity slowly      Call MD for:  temperature >100.4      Call MD for:  persistant nausea and vomiting      Call MD for:  severe uncontrolled pain      Discharge instructions      Comments:   Start taking Metoprolol, Lasix, Clonidine and Benazepril for your BP as prescribed. Follow up with your Doctor. Please call [phone] for any questions or concerns. You can start working back from tomorrow from medical stand point, if you feel okay.        DISCHARGE MEDICATIONS: Medication List  As of 12/02/2011 10:53 AM   TAKE these medications         albuterol 108 (90 BASE) MCG/ACT inhaler   Commonly known as: PROVENTIL HFA;VENTOLIN HFA   Inhale 1 puff into the lungs every 6 (six) hours as needed. Shortness of breath      aspirin 81 MG tablet   Take 81 mg by mouth daily.      benazepril 40 MG tablet   Commonly known as: LOTENSIN   Take 1  tablet (40 mg total) by mouth daily.      brimonidine 0.2 % ophthalmic solution   Commonly known as: ALPHAGAN   Place 1 drop into both eyes 2 (two) times daily.      cetirizine 10 MG tablet   Commonly known as: ZYRTEC   Take 10 mg by mouth daily as needed. allergies      cloNIDine 0.2 MG tablet   Commonly known as: CATAPRES   Take 0.2 mg by mouth daily.      furosemide 40 MG tablet   Commonly known as: LASIX   Take 1 tablet (40 mg total) by mouth daily.      latanoprost 0.005 % ophthalmic solution   Commonly known as: XALATAN   Place 1 drop into both eyes.      metoprolol 50 MG tablet   Commonly known as: LOPRESSOR   Take 1 tablet (50 mg total) by mouth 2 (two) times daily.      nicotine 21 mg/24hr patch   Commonly known as: NICODERM CQ - dosed in mg/24 hours   Place 1 patch onto the skin daily as needed (cravings).  potassium chloride SA 20 MEQ tablet   Commonly known as: K-DUR,KLOR-CON   Take 20 mEq by mouth daily.             CONSULTS:     None  PROCEDURES PERFORMED:  Dg Chest 2 View  11/30/2011  *RADIOLOGY REPORT*  Clinical Data: Chest pain and shortness of breath.  Diabetes and hypertension.  CHEST - 2 VIEW  Comparison:  None.  Findings:  The heart size and mediastinal contours are within normal limits.  Both lungs are clear.  The visualized skeletal structures are unremarkable.  IMPRESSION: No active cardiopulmonary disease.  Original Report Authenticated By: Danae Orleans, M.D.   Ct Angio Chest W/cm &/or Wo Cm  11/30/2011  *RADIOLOGY REPORT*  Clinical Data: Hypertension, borderline elevated D-dimer  CT ANGIOGRAPHY CHEST  Technique:  Multidetector CT imaging of the chest using the standard protocol during bolus administration of intravenous contrast. Multiplanar reconstructed images including MIPs were obtained and reviewed to evaluate the vascular anatomy.  After the administration of contrast the patient experienced a transient episode of unresponsiveness  which resolved without dedicated intervention was transported back to the emergency department. The patient remained hemodynamically stable throughout this episode of unresponsiveness.  Contrast:  100 Omnipaque-300  Comparison: Chest radiograph - earlier same day  Vascular Findings:  There is adequate opacification of the pulmonary vasculature in the main pulmonary artery measuring 239 HU.  There are no discrete filling defects within the main or major segmental branches of the pulmonary arteries to suggest acute pulmonary embolism.  Evaluation of the distal subsegmental pulmonary arteries is degraded secondary to suboptimal vessel opacification.  Caliber of the main pulmonary artery is borderline enlarged measuring 3.2 mm in greatest transverse axial dimension.  Borderline cardiomegaly.  Coronary artery calcifications.  No pericardial effusion.  Normal caliber of the thoracic aorta. Bovine configuration of the aortic arch is suspected though evaluation of the aortic arch is degraded secondary to suboptimal vessel opacification and streak artifact from contrast within the left subclavian and innominate veins.  Scattered minimal atherosclerotic calcifications within a normal caliber descending thoracic aorta.  No periaortic stranding.  Nonvascular findings:  Bibasilar ground-glass opacities favored to represent atelectasis. No focal airspace opacities.  No pleural effusion or pneumothorax. No pleural effusion or pneumothorax.  Central airways are patent.  Limited early arterial phase evaluation of the upper abdomen suggests a small hiatal hernia but otherwise unremarkable.  No acute or aggressive osseous abnormalities.  Thoracic spine degenerative change.  IMPRESSION: 1.  No acute cardiopulmonary disease.  Specifically, negative for pulmonary embolism to the level of the distal subsegmental pulmonary arteries.  2.  Likely contrast reaction with transient episode unresponsiveness.  The patient should receive a steroid  prep prior to future contrast examinations.  Above findings discussed with Dr. Manus Gunning at 1727  Original Report Authenticated By: Waynard Reeds, M.D.       ADMISSION DATA: H&P: The patient is a 61 yo woman, history of HTN, HL, ?DM, smoking, and FH of CAD, presenting with chest pain. Around 11:30 am today, the patient experienced chest pain, described as a sharp mid-sternal pain, 10/10 in severity, occurring at rest, subsiding within 5-10 minutes at rest, associated with diaphoresis and SOB but no nausea/vomiting. EMS was called, and the patient was given aspirin 324 and 1 SL nitro, and brought to [facility]. The patient notes similar episodes of chest pain off and on for the past 2 weeks, occurring 3-4 times/week, but those pains were less severe and  not associated with SOB. Of note, the patient notes significant stress lately, with the recent death of her son (lymphoma). She notes an occasional cough productive of white-yellow sputum, which has been stable for many months, and which has not increased in frequency. She notes baseline bilateral LE edema, but notes that it has been worsening over the last few days, L>R (which is her baseline). She notes no fevers, chills, rhinorrhea, nausea, vomiting, abd pain, diarrhea, constipation, dysuria. She has no history of obstructive lung disease, but uses an albuterol inhaler as needed for occasional "bronchitis".  Physical Exam: Blood pressure 177/99, pulse 81, temperature 98.1 F (36.7 C), temperature source Oral, resp. rate 15, SpO2 100.00%.  General: alert, responds to questions with 1-word answers, asks [place] to leave her alone HEENT: pupils equal round and reactive to light, vision grossly intact, oropharynx clear and non-erythematous  Neck: supple, no lymphadenopathy Lungs: normal work of respiration, mild bilateral wheeze heard throughout lung fields Heart: regular rate and rhythm, no murmurs, gallops, or rubs Abdomen: soft, non-tender, non-distended, normal  bowel sounds  Extremities: bilateral 1+ pitting edema, possibly slightly more swelling in left leg Neurologic: alert & oriented X3, cranial nerves II-XII intact, strength grossly intact, sensation intact to light touch  Labs: Basic Metabolic Panel:   Basename  11/30/11 1351   NA  141   K  3.8   CL  101   CO2  32   GLUCOSE  85   BUN  19   CREATININE  1.12*   CALCIUM  9.4   MG  --   PHOS  --    CBC:   Basename  11/30/11 1351   WBC  6.5   NEUTROABS  4.1   HGB  12.6   HCT  38.8   MCV  93.0   PLT  212    Cardiac Enzymes:   Basename  11/30/11 1352   CKTOTAL  --   CKMB  --   CKMBINDEX  --   TROPONINI  <0.30    BNP:   Basename  11/30/11 1352   PROBNP  125.5*    D-Dimer:   Basename  11/30/11 1520   DDIMER  0.54*      HOSPITAL COURSE:  # Atypical chest pain - Patient presented with mid-sternal, sharp chest pain, not associated with exertion, but associated with SOB and diaphoresis, with CAD risk factors of HTN, HL, smoking, FH, and age > 27, though no known history of CAD, with normal EKG and negative CXR. ACS, PE, pneumonia, aortic dissection- ruled out with CT angiogram, serial cardiac enzymes and EKG. Patient had few episodes of chest pain during hospital stay- which correlated with anxiety and crying spells- due to thinking about her son's death. Lipid panel and other tests were within normal limits. Smoking cessation counseling was provided with nicotine patches at discharge.  - Patient is stable on discharge- will followup with her PCP on may 12/07/2011. - She mentioned talking to a counselor for grief counseling after discharge - which will likely be helpful for her at present. - If her chest pain continues- will need outpatient cardiac stress testing to rule out CAD.   # Hypertension - BP's  Fluctuating during hospital course. Highest 218/118. -  patient will be discharged on 4 blood pressure medications- # metoprolol 50 twice a day- dose decreased 2/2  bradycardia on 100 twice a day, # Benazepril- 40 mg daily- increased from 20 mg daily home dose, Lasix 40 mg daily and Clonidine-0.2 mg  twice a day  # LE edema - possibly secondary to chronic venous insufficiency. No history of CHF, and BNP negative.  -continue lasix at discharge- will possibly help for hypertension and edema. -will defer echo, as patient with normal BNP and no history of CHF  -LE dopplers for asymmetric swelling- no acute DVT  # Tobacco abuse  -encouraged smoking cessation  -nicotine patch prn    DISCHARGE DATA: Vital Signs: BP 144/87  Pulse 61  Temp(Src) 99.2 F (37.3 C) (Oral)  Resp 18  SpO2 99%  Labs: Results for orders placed during the hospital encounter of 11/30/11 (from the past 24 hour(s))  GLUCOSE, CAPILLARY     Status: Abnormal   Collection Time   12/01/11 11:13 AM      Component Value Range   Glucose-Capillary 102 (*) 70 - 99 (mg/dL)   Comment 1 Notify RN    GLUCOSE, CAPILLARY     Status: Abnormal   Collection Time   12/01/11  4:40 PM      Component Value Range   Glucose-Capillary 115 (*) 70 - 99 (mg/dL)   Comment 1 Notify RN    GLUCOSE, CAPILLARY     Status: Abnormal   Collection Time   12/01/11  9:27 PM      Component Value Range   Glucose-Capillary 106 (*) 70 - 99 (mg/dL)  BASIC METABOLIC PANEL     Status: Abnormal   Collection Time   12/02/11  5:30 AM      Component Value Range   Sodium 138  135 - 145 (mEq/L)   Potassium 3.7  3.5 - 5.1 (mEq/L)   Chloride 101  96 - 112 (mEq/L)   CO2 28  19 - 32 (mEq/L)   Glucose, Bld 101 (*) 70 - 99 (mg/dL)   BUN 22  6 - 23 (mg/dL)   Creatinine, Ser 1.61  0.50 - 1.10 (mg/dL)   Calcium 9.3  8.4 - 09.6 (mg/dL)   GFR calc non Af Amer 54 (*) >90 (mL/min)   GFR calc Af Amer 62 (*) >90 (mL/min)  GLUCOSE, CAPILLARY     Status: Abnormal   Collection Time   12/02/11  7:27 AM      Component Value Range   Glucose-Capillary 103 (*) 70 - 99 (mg/dL)     Total time spent in Discharge- 35 minutes. Signed: Lyn Hollingshead, MD PGY 2, Internal Medicine Resident 12/02/2011, 10:53 AM

## 2012-03-06 ENCOUNTER — Other Ambulatory Visit (HOSPITAL_COMMUNITY): Payer: Self-pay | Admitting: Internal Medicine

## 2012-03-06 NOTE — Telephone Encounter (Signed)
Not [facility] pt; please send back to the pharmacy  Thanks 

## 2012-05-28 ENCOUNTER — Ambulatory Visit: Payer: Self-pay | Admitting: Internal Medicine

## 2012-05-28 IMAGING — MG MM CAD SCREENING MAMMO
1 series · 6 of 6 positions shown · non-contrast
Comparison: none

REASON FOR EXAM: SCR MAMMO NO ORDER
COMMENTS:

PROCEDURE:     MAM - MAM DGTL SCRN MAM NO ORDER W/CAD  - [DATE]  [DATE]
RESULT:     Breast are fatty-replaced. No mass. No pathologic clustered
calcification demonstrated. CAD evaluation is nonfocal.

[R CC · right · 6 of 6 slices shown]
[im 1/6]
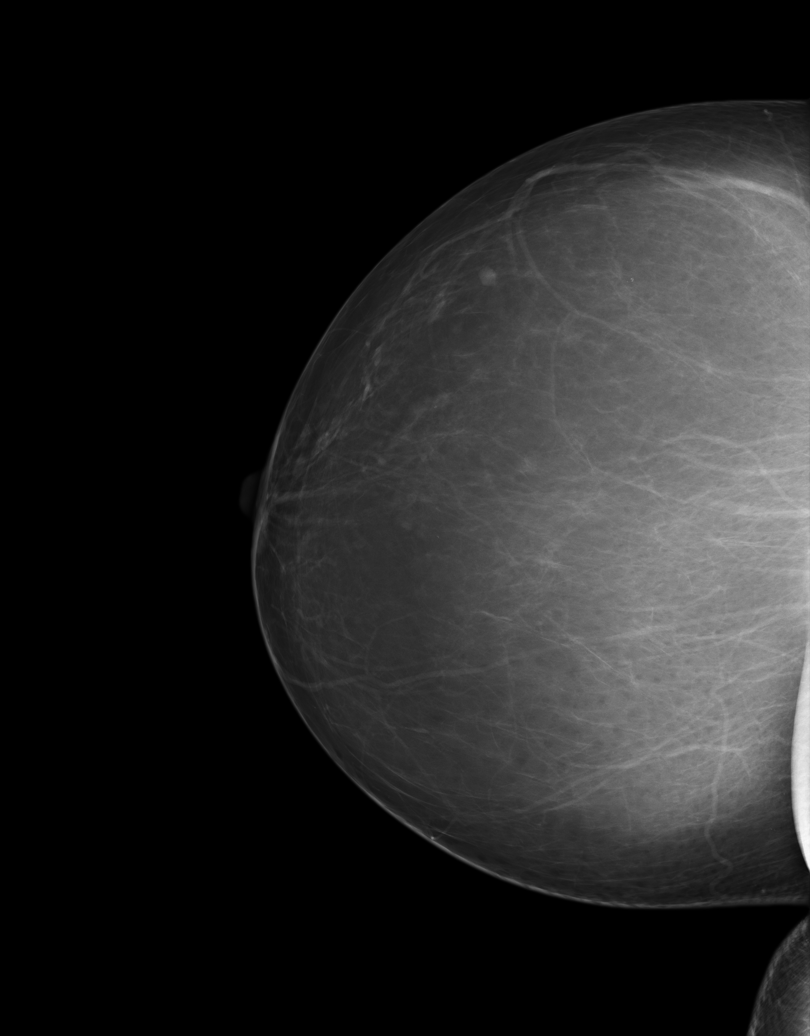
[im 2/6]
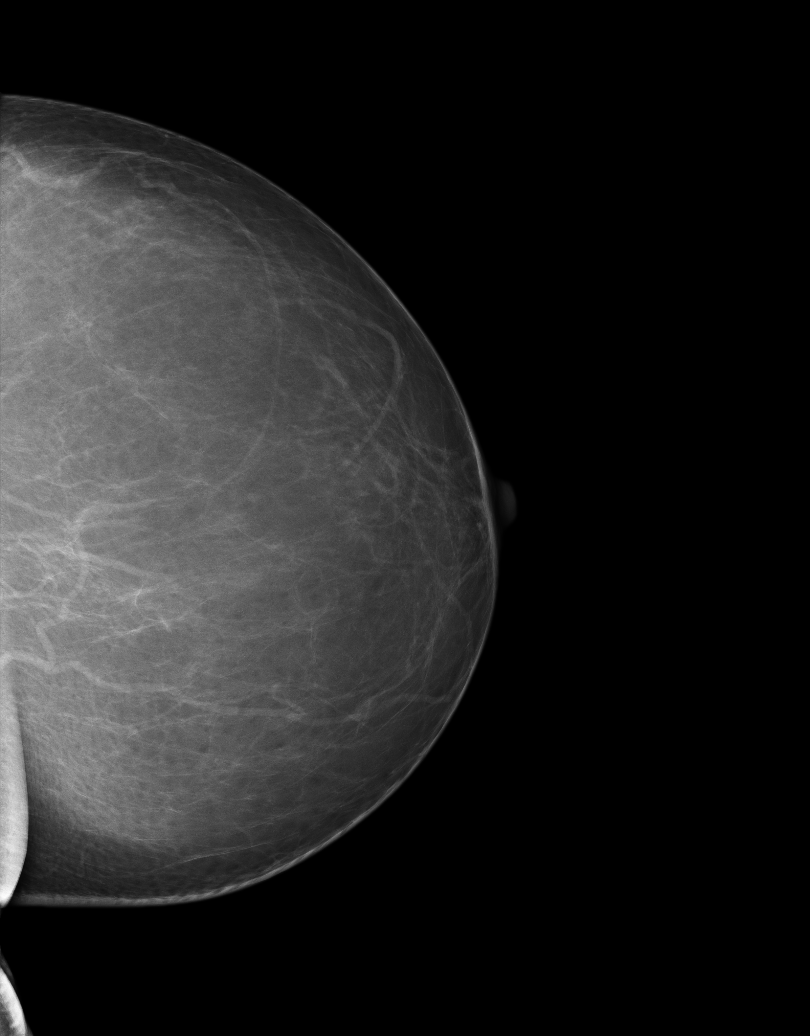
[im 3/6]
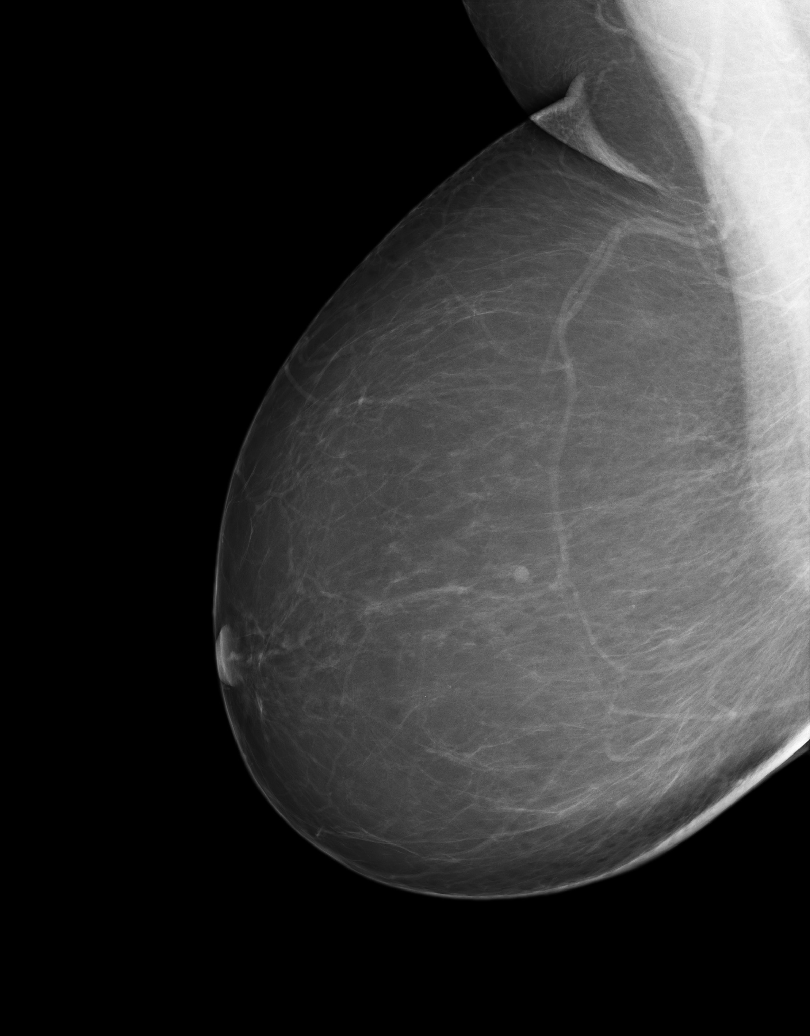
[im 4/6]
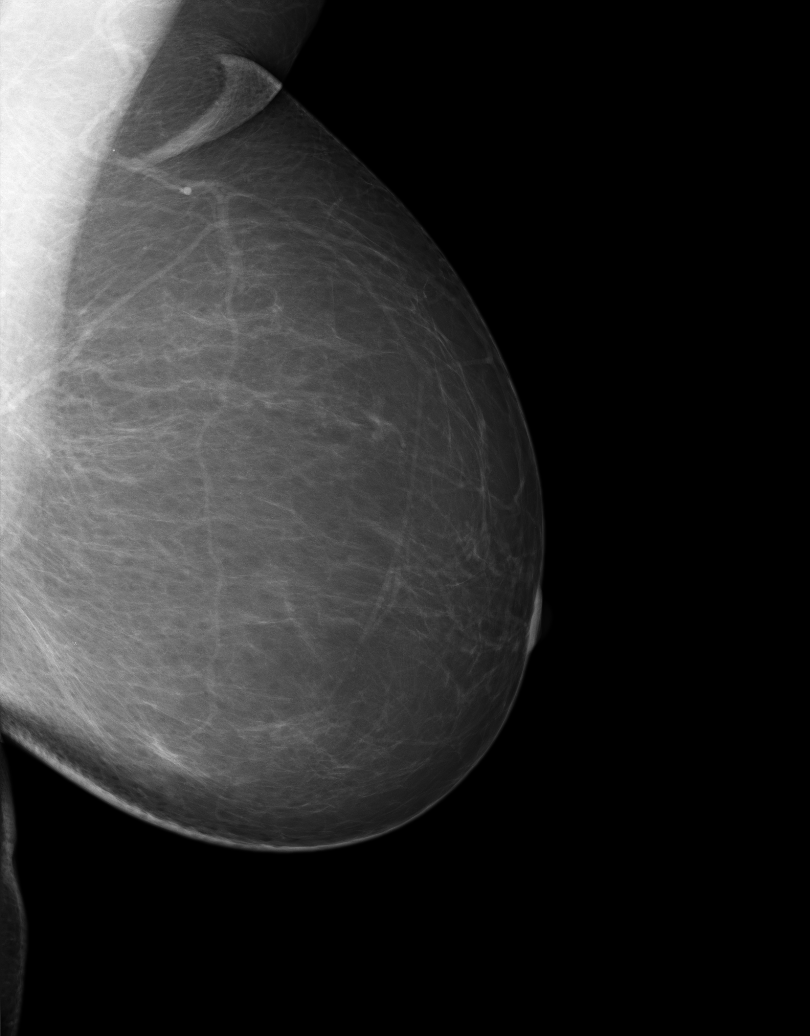
[im 5/6]
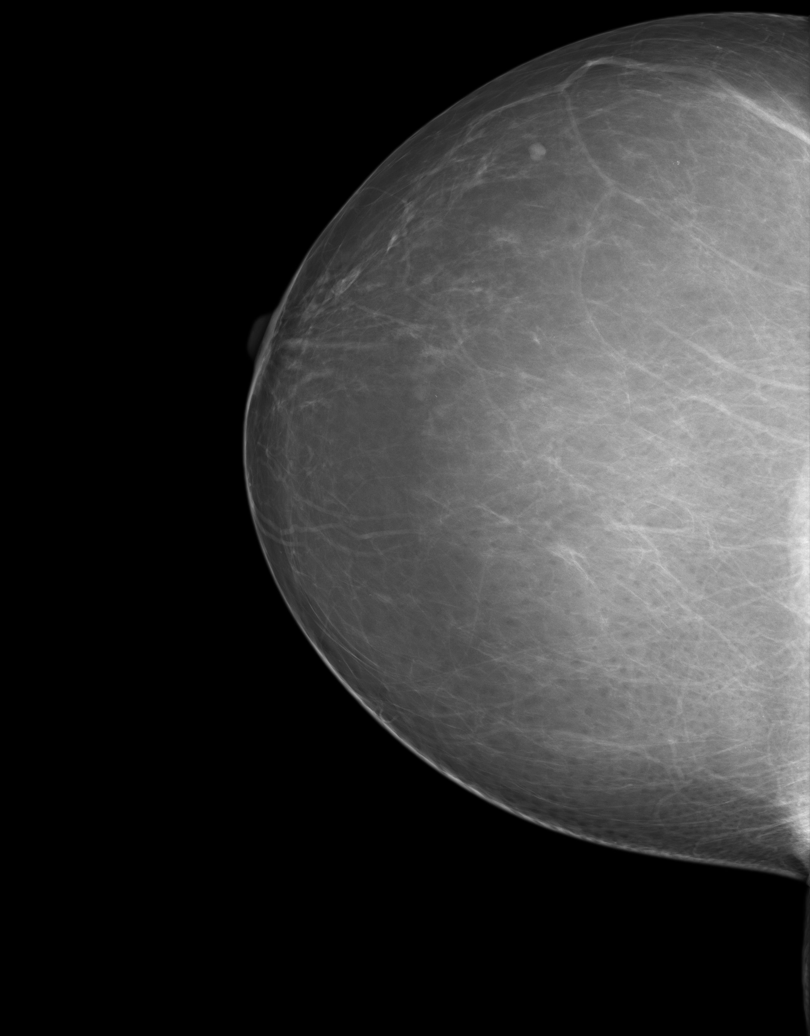
[im 6/6]
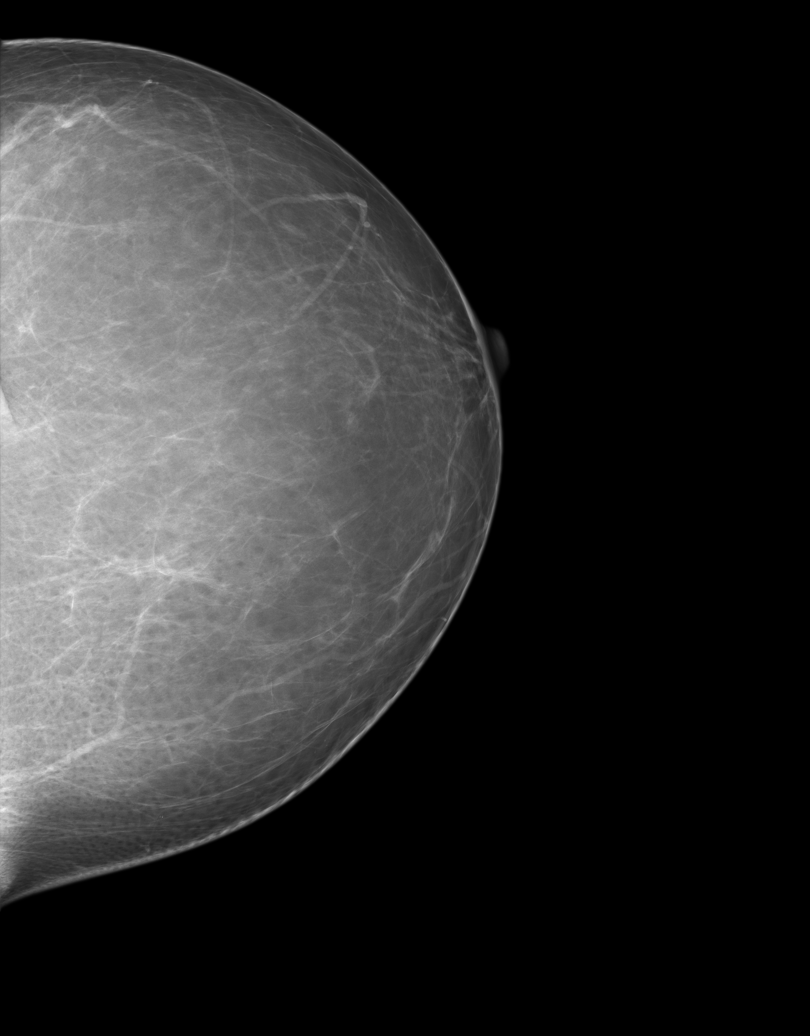

[6 of 6 positions shown; findings below may reference images not displayed]

IMPRESSION: Benign exam. Routine yearly followup mammogram suggested.
No change noted from prior exams.

BI-RADS: Category 2- Benign Finding

A NEGATIVE MAMMOGRAM REPORT DOES NOT PRECLUDE BIOPSY OR OTHER EVALUATION OF
A CLINICALLY PALPABLE OR OTHERWISE SUSPICIOUS MASS OR LESION. BREAST CANCER
MAY NOT BE DETECTED IN UP TO 10% OF CASES.

## 2013-08-05 ENCOUNTER — Ambulatory Visit: Payer: Self-pay | Admitting: Internal Medicine

## 2013-08-05 IMAGING — MG MM DIGITAL SCREENING BILAT W/ CAD
1 series · 4 of 4 positions shown · non-contrast
Comparison: Previous exam(s).

CLINICAL DATA: Screening.

EXAM:
DIGITAL SCREENING BILATERAL MAMMOGRAM WITH CAD

[R CC · right · 4 of 4 slices shown]
[im 1/4]
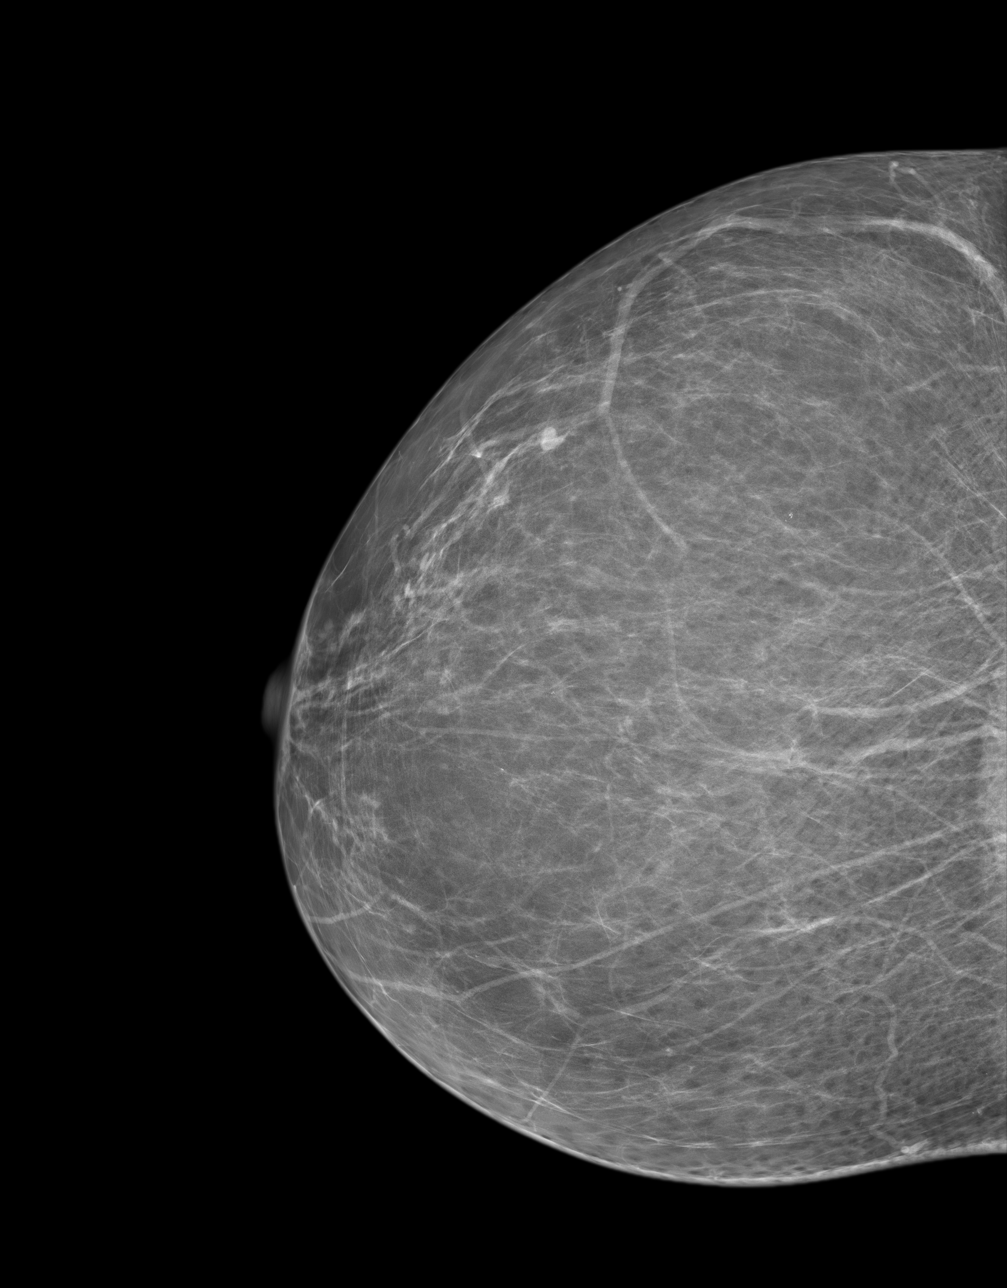
[im 2/4]
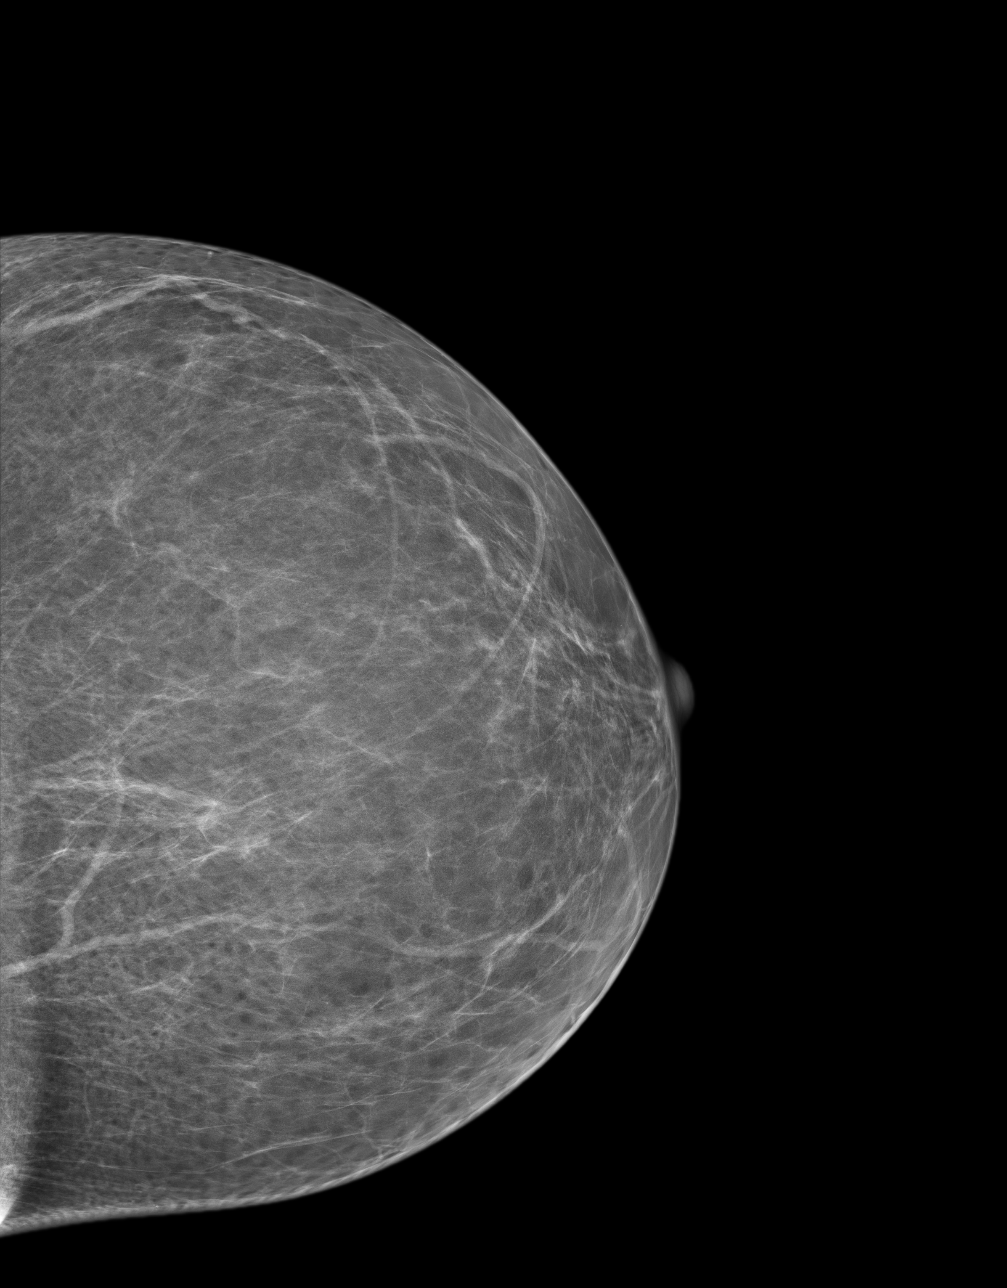
[im 3/4]
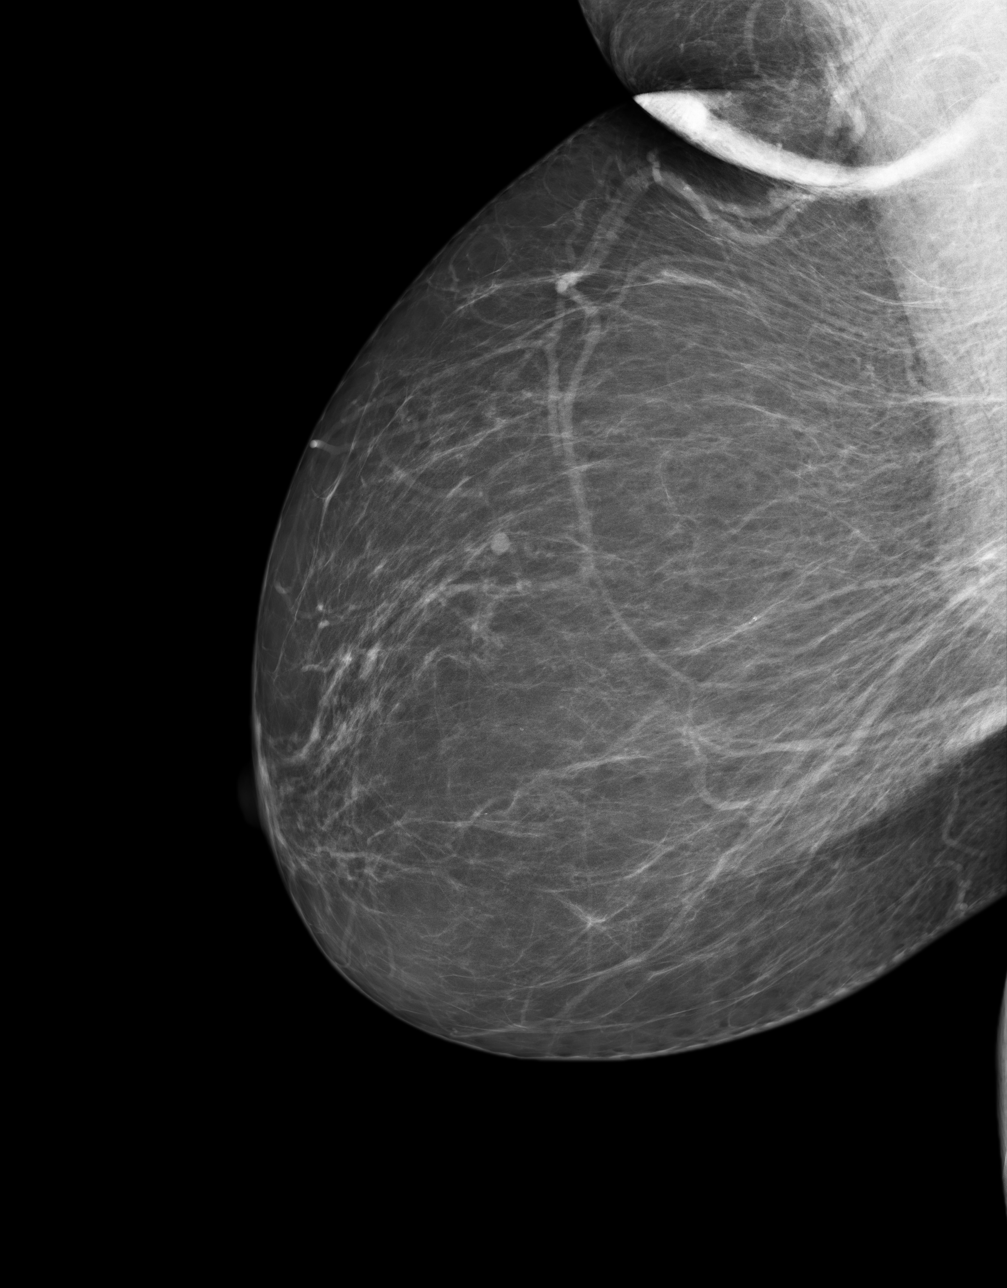
[im 4/4]
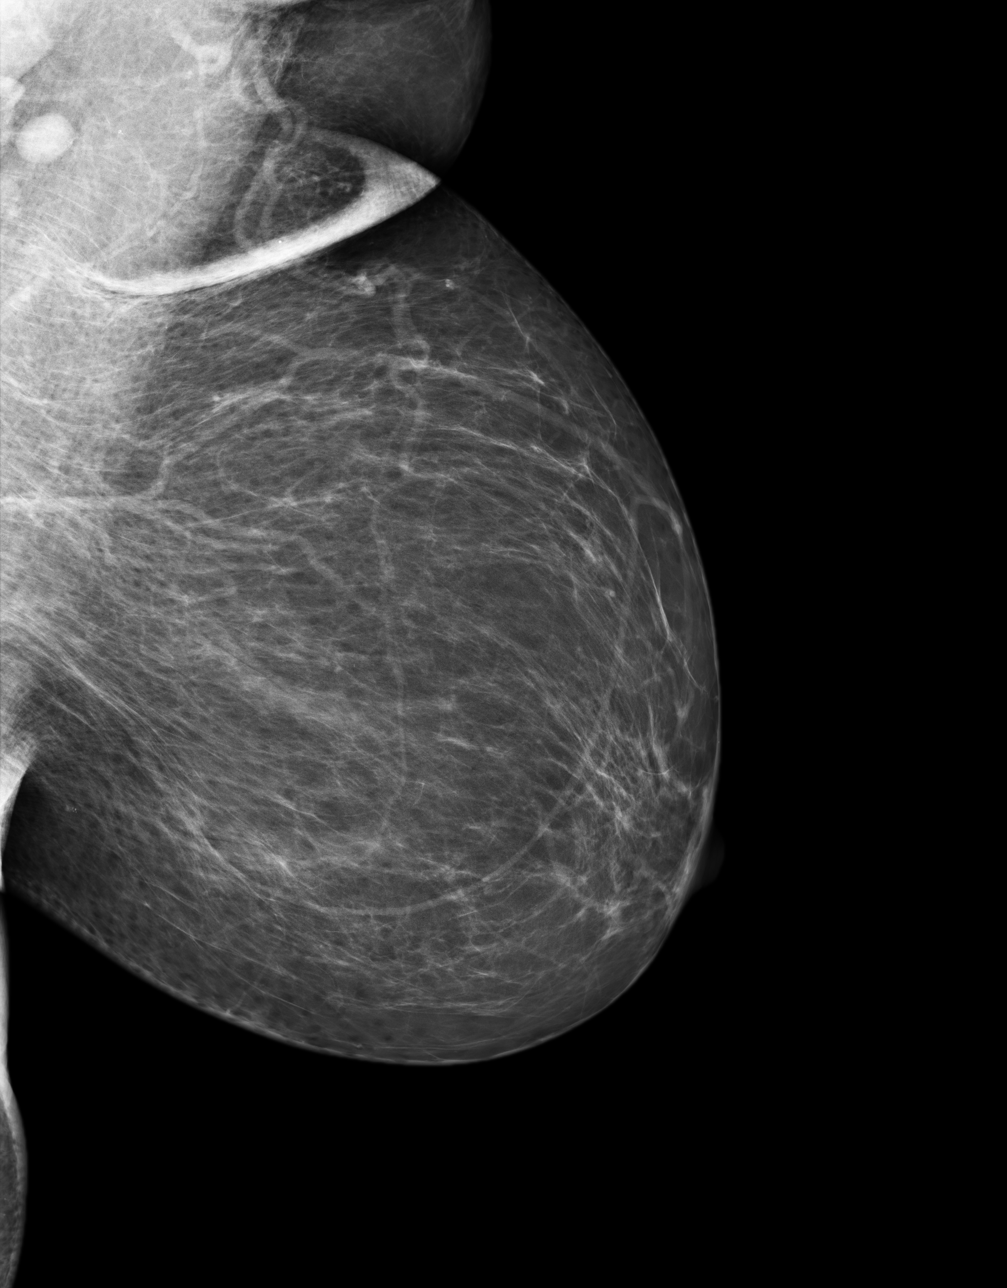

[4 of 4 positions shown; findings below may reference images not displayed]

ACR Breast Density Category b: There are scattered areas of
fibroglandular density.
FINDINGS: There are no findings suspicious for malignancy. Images were
processed with CAD.
IMPRESSION: No mammographic evidence of malignancy. A result letter of this
screening mammogram will be mailed directly to the patient.

RECOMMENDATION:
Screening mammogram in one year. (Code:[HN])

BI-RADS CATEGORY  1: Negative

## 2014-12-14 ENCOUNTER — Other Ambulatory Visit: Payer: Self-pay | Admitting: Otolaryngology

## 2014-12-14 DIAGNOSIS — R221 Localized swelling, mass and lump, neck: Secondary | ICD-10-CM

## 2014-12-18 ENCOUNTER — Ambulatory Visit
Admission: RE | Admit: 2014-12-18 | Discharge: 2014-12-18 | Disposition: A | Discharge status: Home / Self Care | Payer: BLUE CROSS/BLUE SHIELD | Source: Ambulatory Visit | Attending: Otolaryngology | Admitting: Otolaryngology

## 2014-12-18 ENCOUNTER — Other Ambulatory Visit: Payer: Self-pay | Admitting: Otolaryngology

## 2014-12-18 DIAGNOSIS — R221 Localized swelling, mass and lump, neck: Secondary | ICD-10-CM

## 2014-12-18 DIAGNOSIS — E041 Nontoxic single thyroid nodule: Secondary | ICD-10-CM | POA: Insufficient documentation

## 2014-12-18 IMAGING — CT CT NECK W/O CM
4 of 5 series · 15 of 33 positions shown, 17 images · non-contrast
Comparison: None.

CLINICAL DATA: Right parotid tail mass present for 1.5 years.

EXAM:
CT NECK WITHOUT CONTRAST
TECHNIQUE: Multidetector CT imaging of the neck was performed following the
standard protocol without intravenous contrast.

[Series 2: axial · axial · 0.51mm/px · z∈[-428,-348]mm · 3 of 100 slices shown]
[im 20/100  bone]
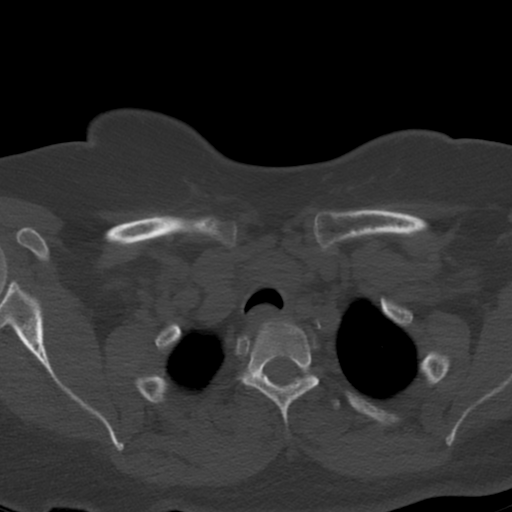
[im 40/100  bone]
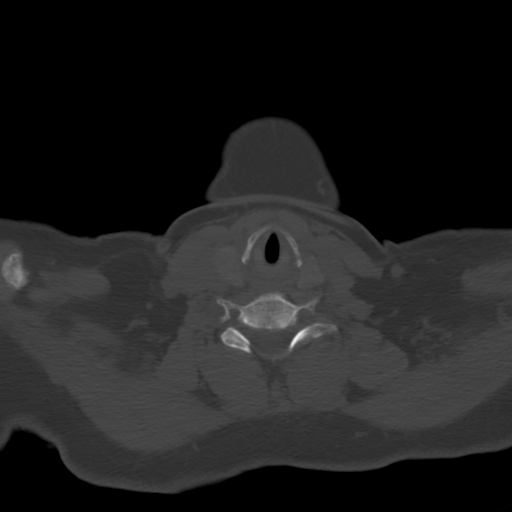
[im 60/100  bone]
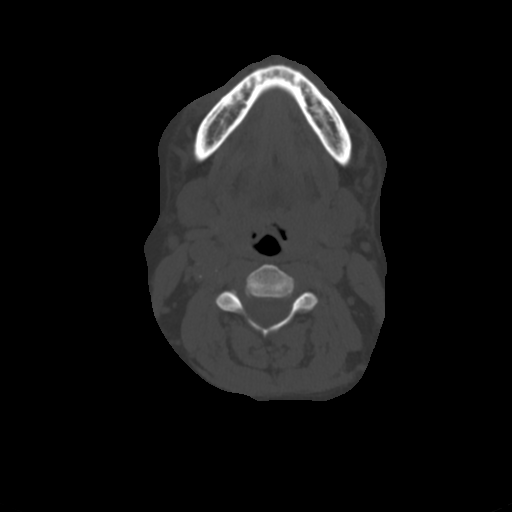

[Series 4: sag neck · sagittal · 0.40mm/px · 5 of 101 slices shown, 6 images]
[im 34/101  bone]
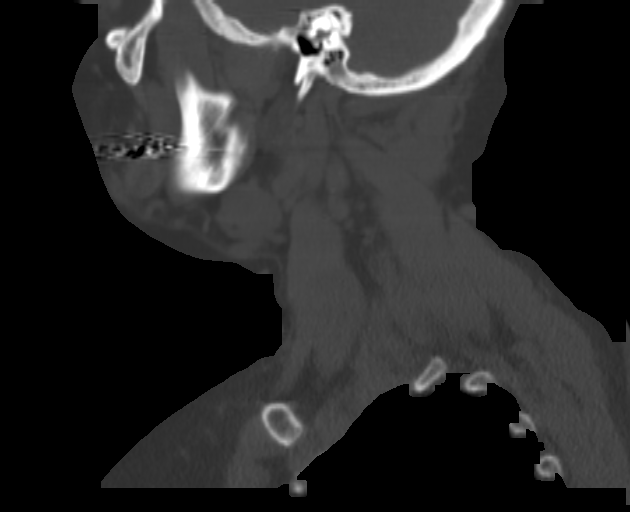
[im 42/101  bone]
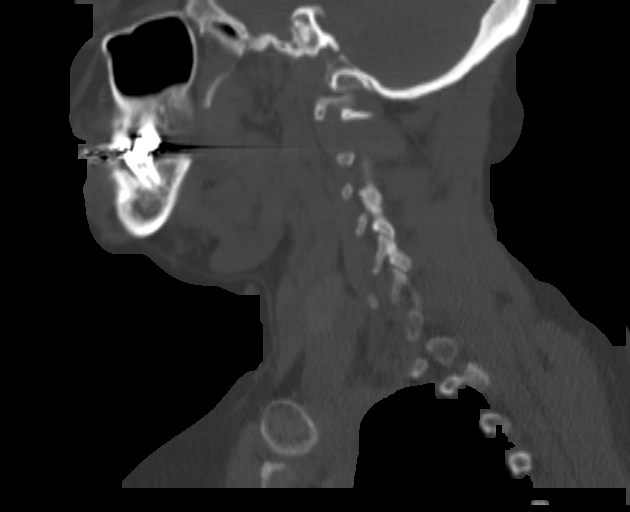
[im 51/101  soft-tissue]
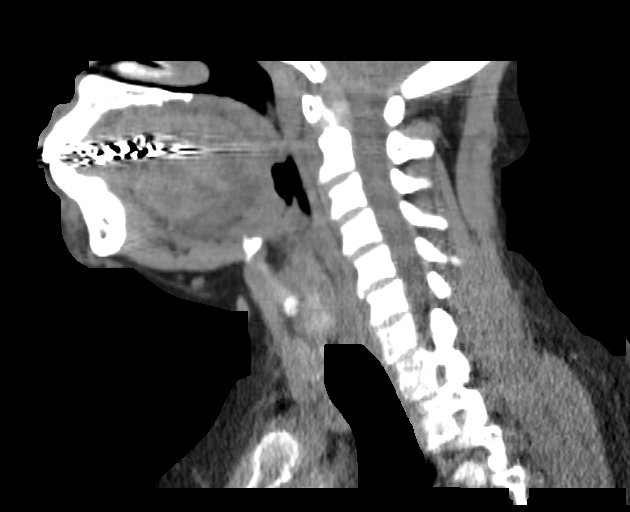
[im 51/101  bone]
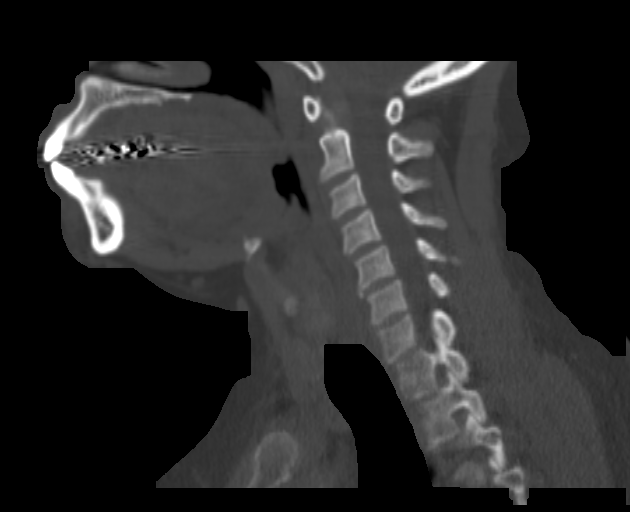
[im 59/101  bone]
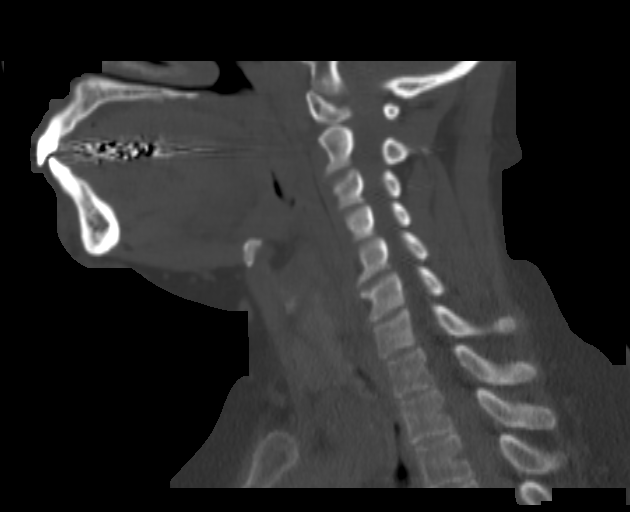
[im 67/101  bone]
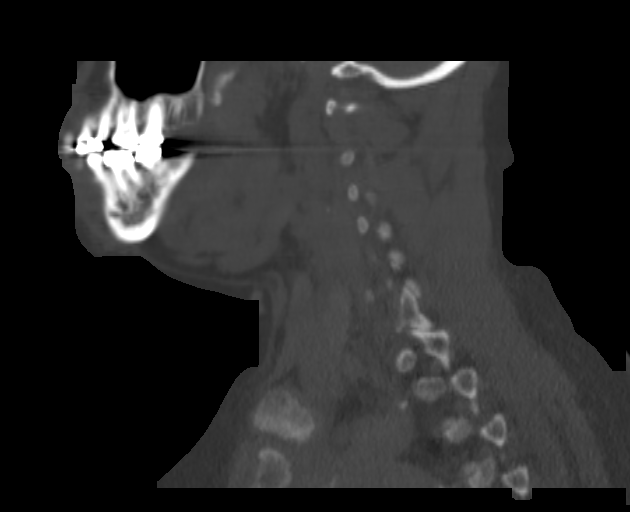

[Series 5: cor neck · coronal · 0.45mm/px · 3 of 117 slices shown]
[im 24/117  bone]
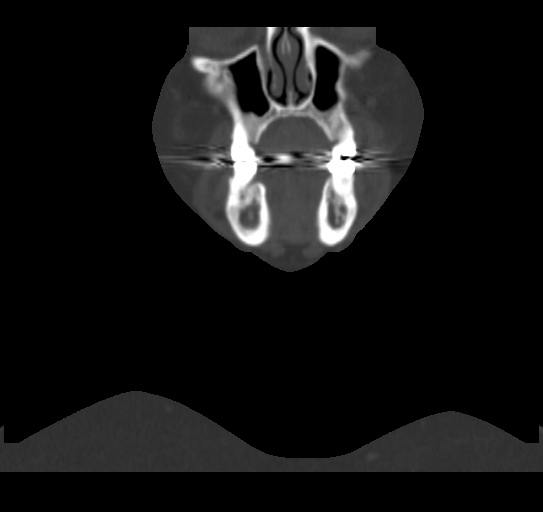
[im 47/117  bone]
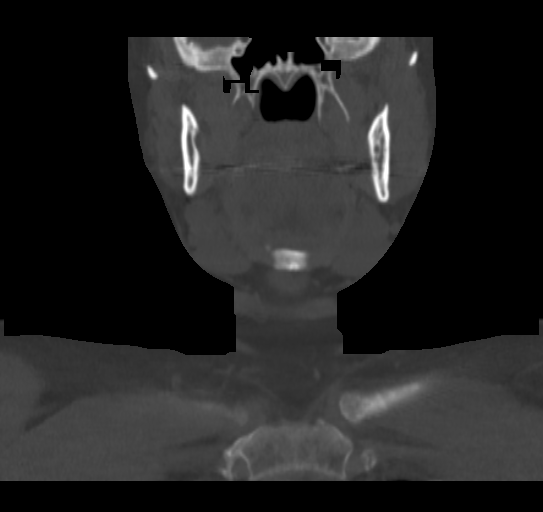
[im 70/117  bone]
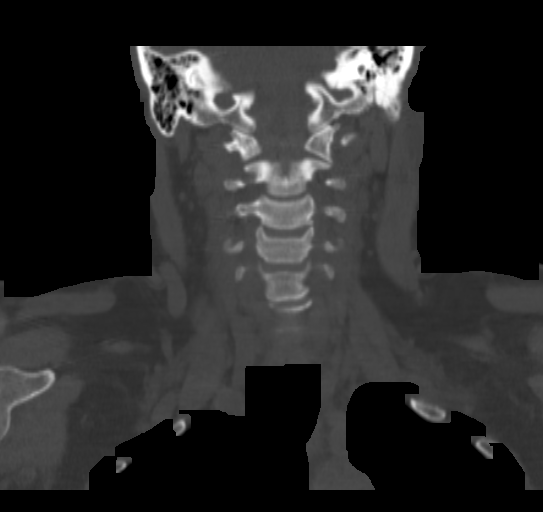

[Series 6: ax oropharynx · axial · 0.39mm/px · z∈[-471,-323]mm · 4 of 128 slices shown, 5 images]
[im 26/128  soft-tissue]
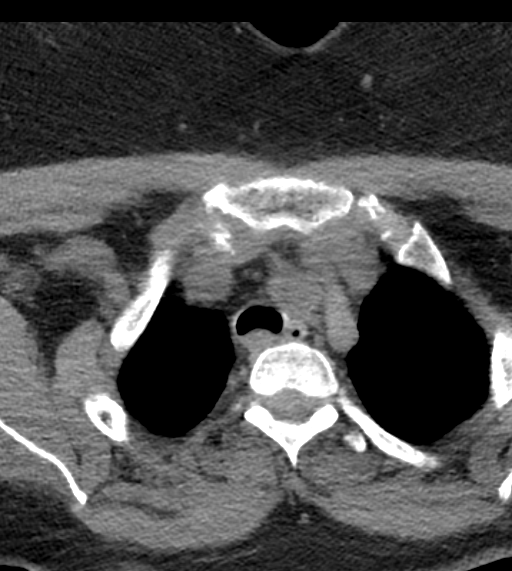
[im 26/128  bone]
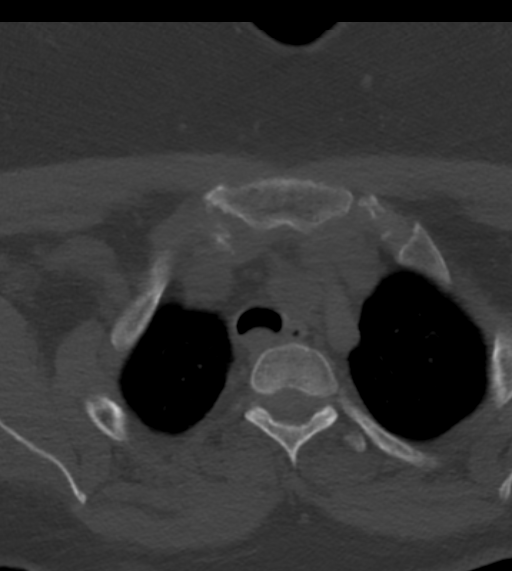
[im 51/128  bone]
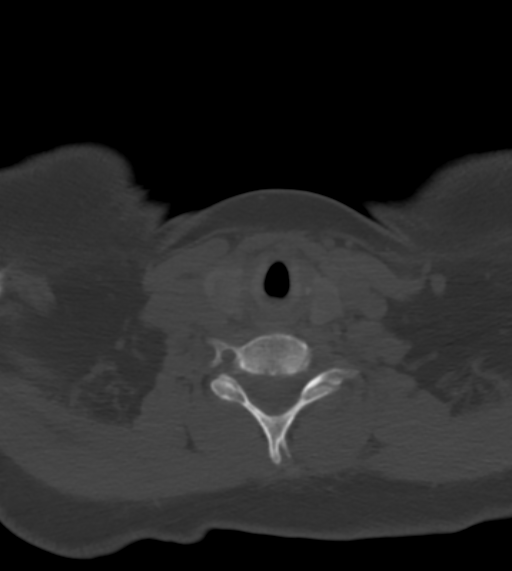
[im 77/128  bone]
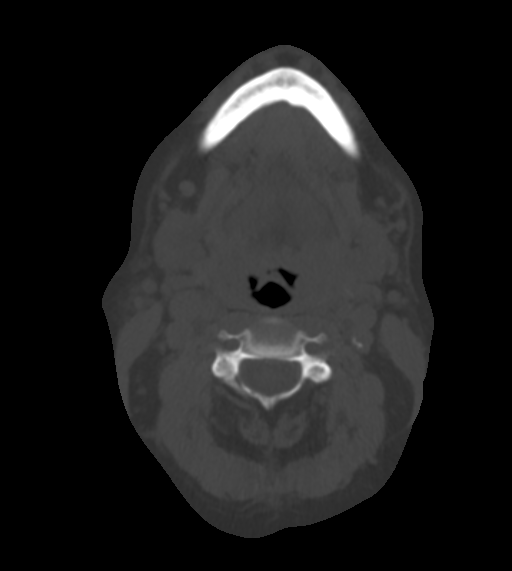
[im 102/128  bone]
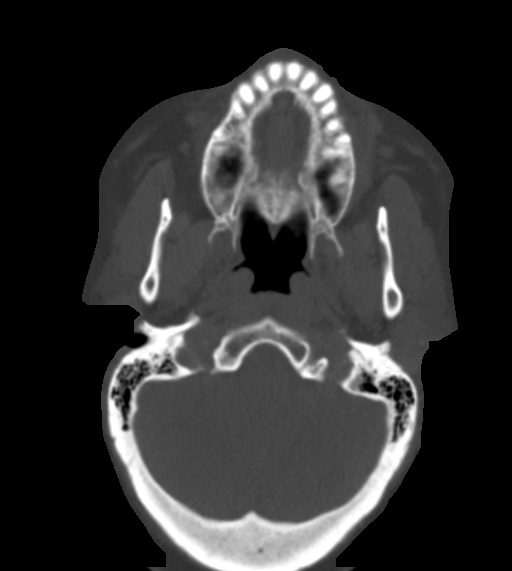

[15 of 33 positions shown; findings below may reference images not displayed]

FINDINGS: The visualized portions of the brain and orbits are unremarkable.
Visualized paranasal sinuses and mastoid air cells are clear.

Evaluation of soft tissue spaces of the neck is partially limited by
lack of IV contrast. The nasopharynx, oropharynx, oral cavity,
hypopharynx, and larynx are unremarkable without evidence of mass.
There is mild heterogeneity and nodularity of the thyroid gland
without a dominant mass identified, although there may be an 11 mm
exophytic nodule extending off the left lower pole (obscured on
[DATE] chest CT by streak artifact from dense venous contrast).
The submandibular glands and left parotid gland are unremarkable. In
the right parotid tail is a homogeneous soft tissue mass which is
well-circumscribed and measures 2.3 x 1.8 x 2.3 cm. Stylomastoid
foramen is normal in appearance.

No enlarged cervical lymph nodes are identified. Mild-to-moderate
bilateral carotid bifurcation atherosclerosis is noted. Visualized
lung apices are clear. No lytic or blastic osseous lesions are
identified.
IMPRESSION: 1. 2.3 cm right parotid tail, most likely representing a primary
parotid neoplasm.
2. No enlarged cervical lymph nodes.
3. Heterogeneous and mildly nodular thyroid gland. Consider thyroid
ultrasound for further evaluation as clinically warranted.

## 2015-10-27 IMAGING — US US EXTREM LOW VENOUS*R*
1 series · 13 of 24 positions shown · non-contrast
Comparison: None.

CLINICAL DATA: Right lower extremity redness and tenderness for 1
month. No known injury. Initial encounter.



[Series 1: us extrem low venous*right* · 0.08mm/px · 13 of 34 slices shown]
[im 1/34]
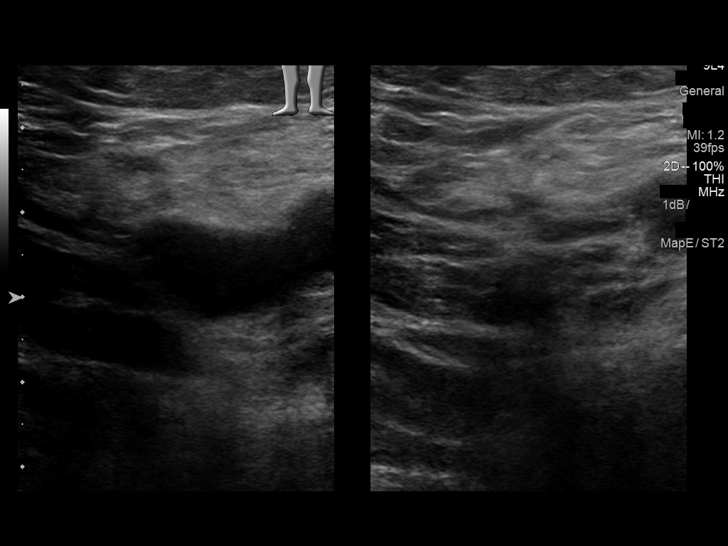
[im 3/34]
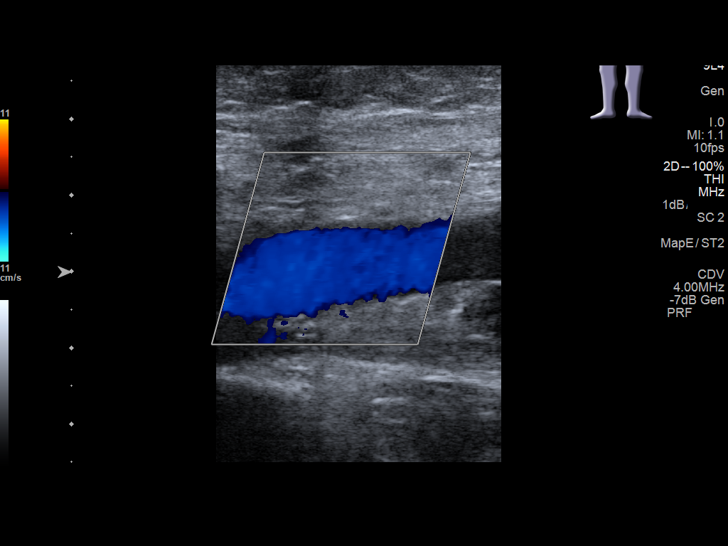
[im 6/34]
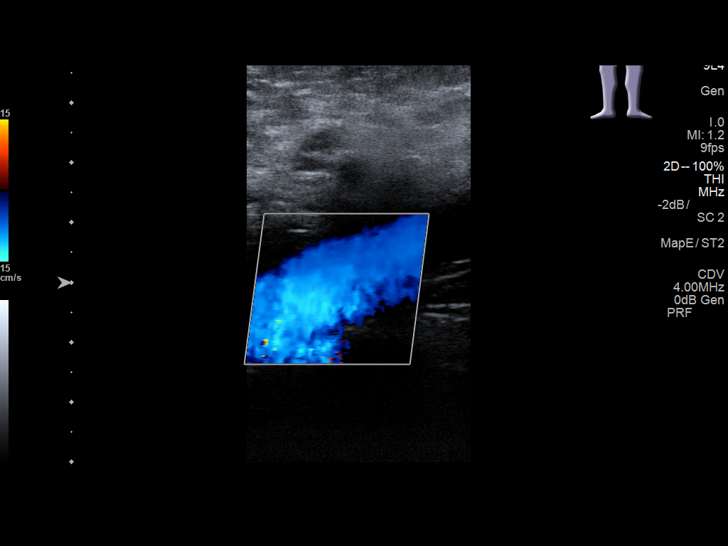
[im 9/34]
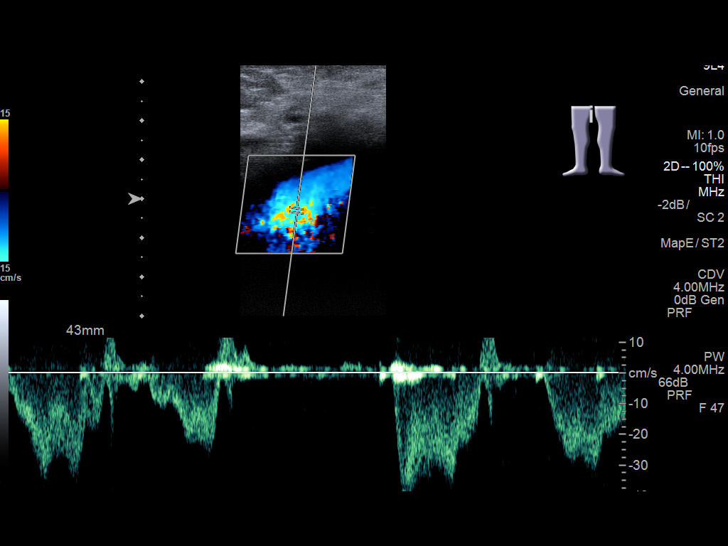
[im 12/34]
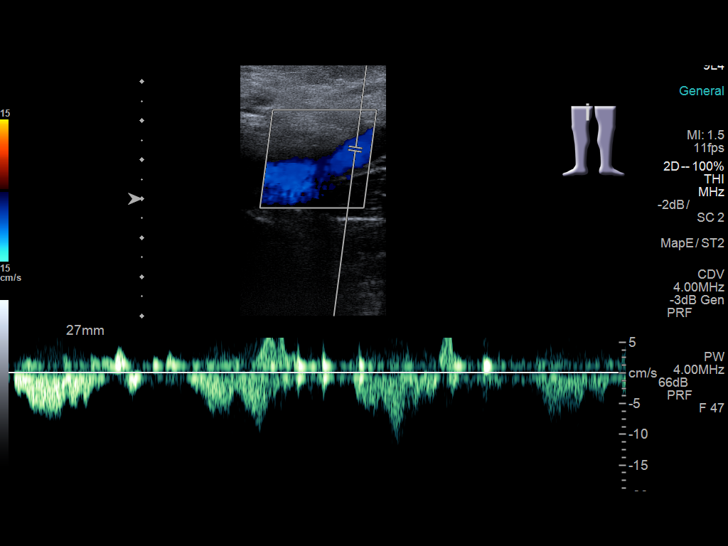
[im 15/34]
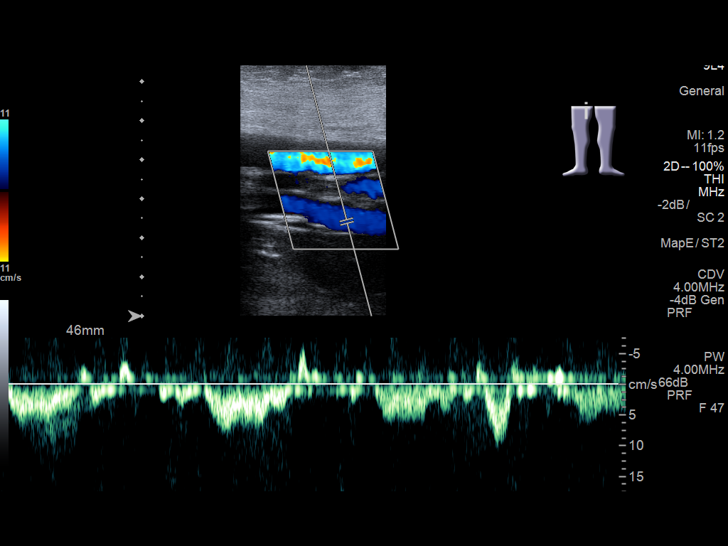
[im 18/34]
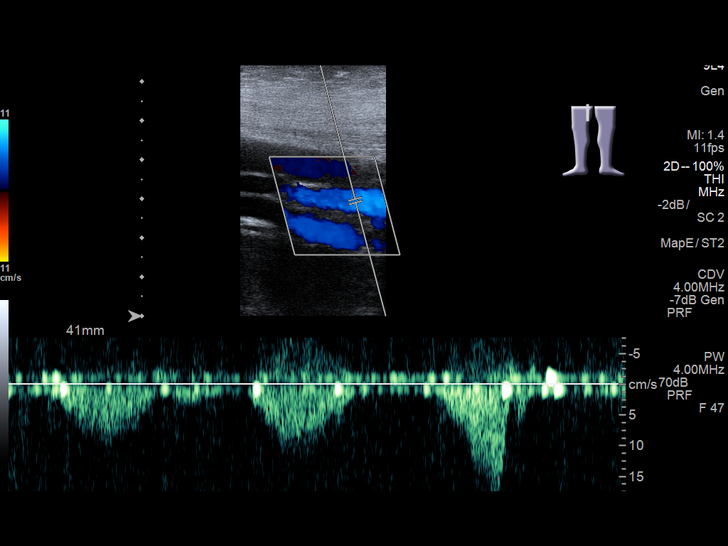
[im 19/34]
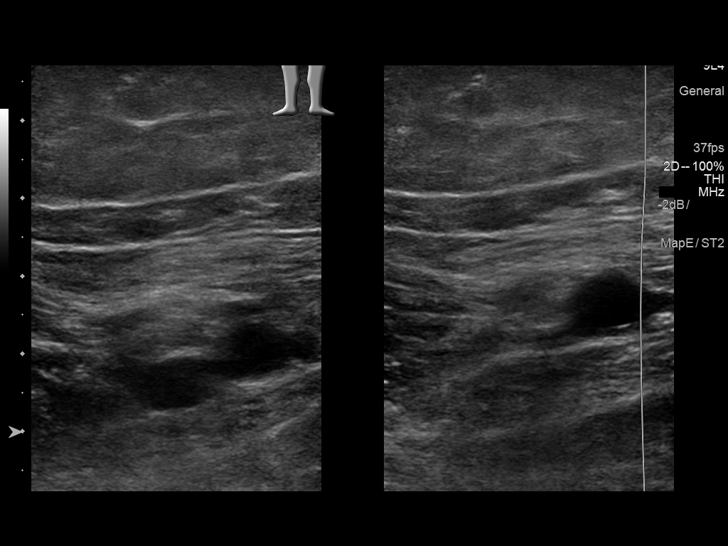
[im 22/34]
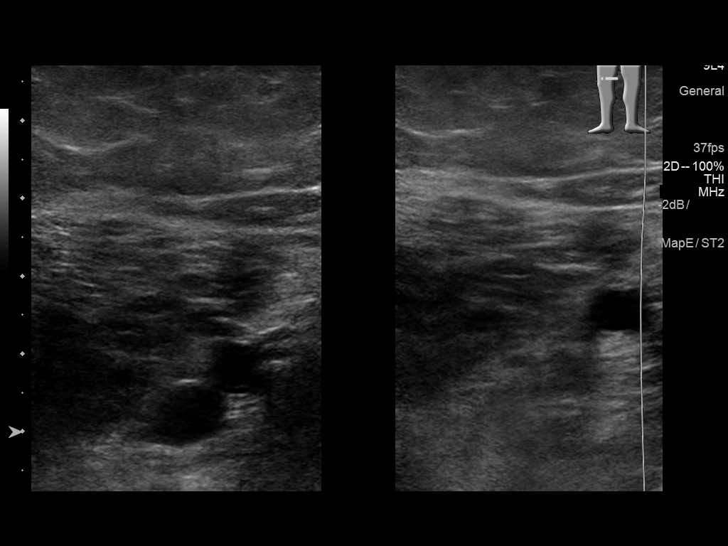
[im 25/34]
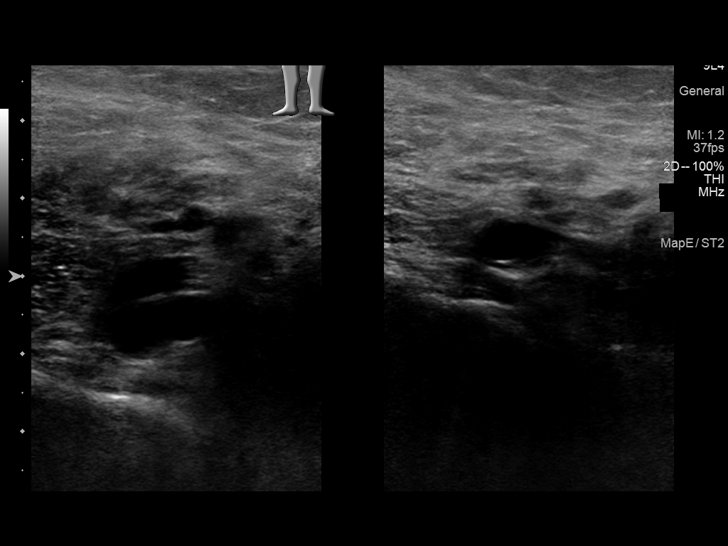
[im 28/34]
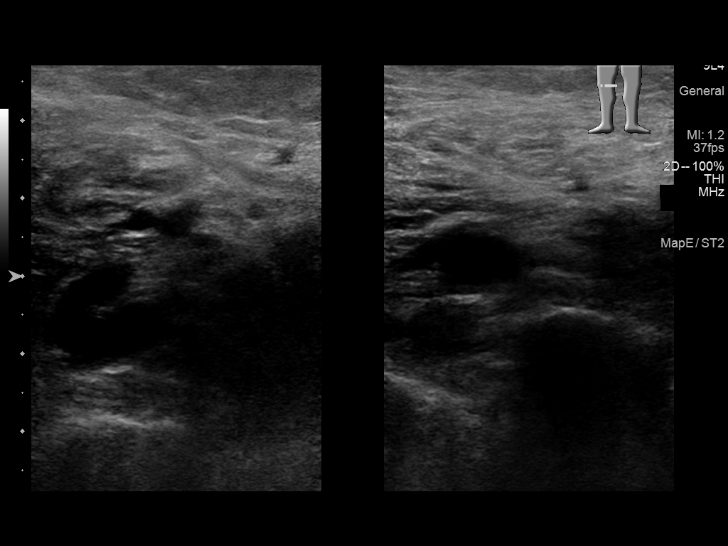
[im 31/34]
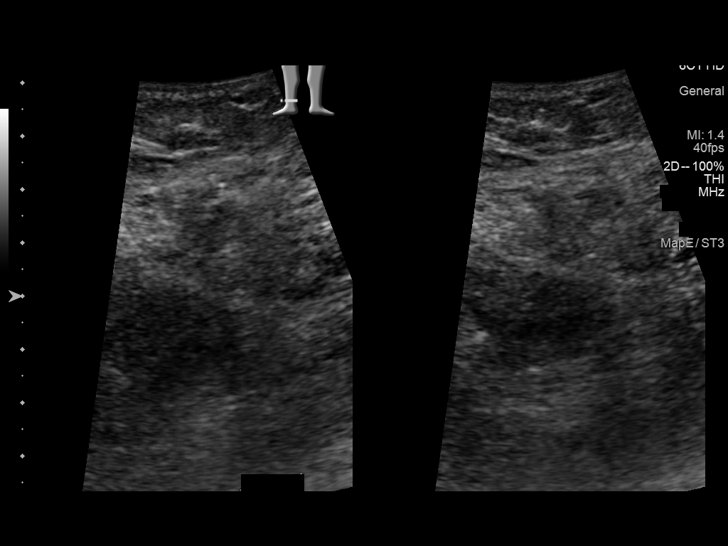
[im 34/34]
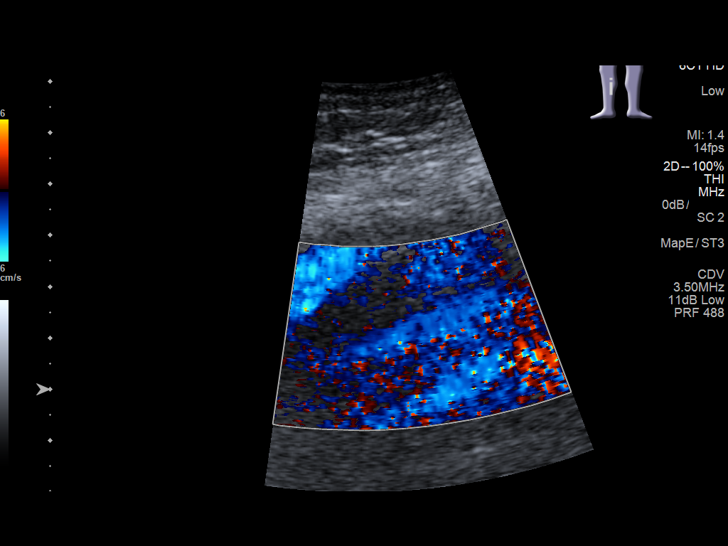

[13 of 24 positions shown; findings below may reference images not displayed]

FINDINGS: Contralateral Common Femoral Vein: Respiratory phasicity is normal
and symmetric with the symptomatic side. No evidence of thrombus.
Normal compressibility.

Common Femoral Vein: No evidence of thrombus. Normal
compressibility, respiratory phasicity and response to augmentation.

Saphenofemoral Junction: No evidence of thrombus. Normal
compressibility and flow on color Doppler imaging.

Profunda Femoral Vein: No evidence of thrombus. Normal
compressibility and flow on color Doppler imaging.

Femoral Vein: No evidence of thrombus. Normal compressibility,
respiratory phasicity and response to augmentation.

Popliteal Vein: No evidence of thrombus. Normal compressibility,
respiratory phasicity and response to augmentation.

Calf Veins: No evidence of thrombus. Normal compressibility and flow
on color Doppler imaging.

Superficial Great Saphenous Vein: No evidence of thrombus. Normal
compressibility and flow on color Doppler imaging.

Venous Reflux:  None.

Other Findings:  None.
IMPRESSION: No evidence of deep venous thrombosis.

## 2015-10-28 IMAGING — US US RENAL
1 series · 14 of 25 positions shown · non-contrast
Comparison: None.

CLINICAL DATA: Acute renal failure

EXAM:
RENAL / URINARY TRACT ULTRASOUND COMPLETE

[Series 1: us renal · 0.23mm/px · 14 of 51 slices shown]
[im 1/51]
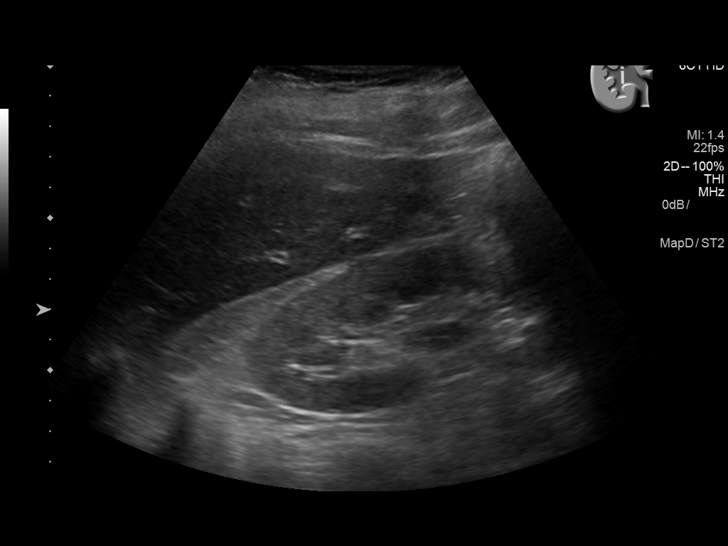
[im 5/51]
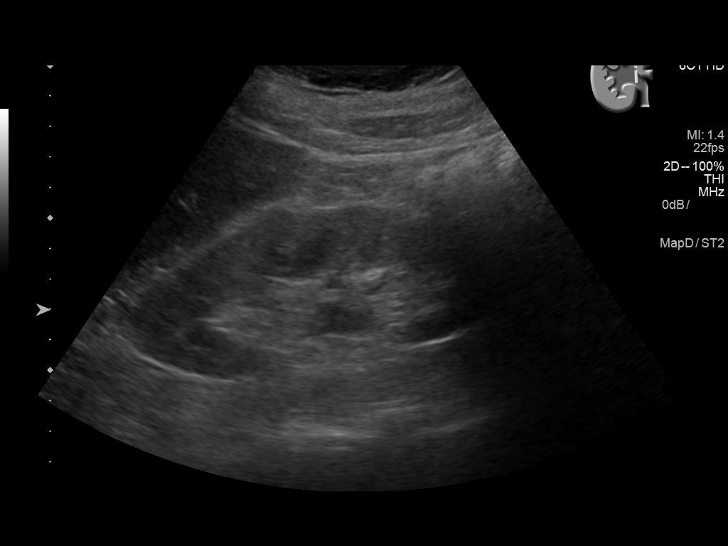
[im 9/51]
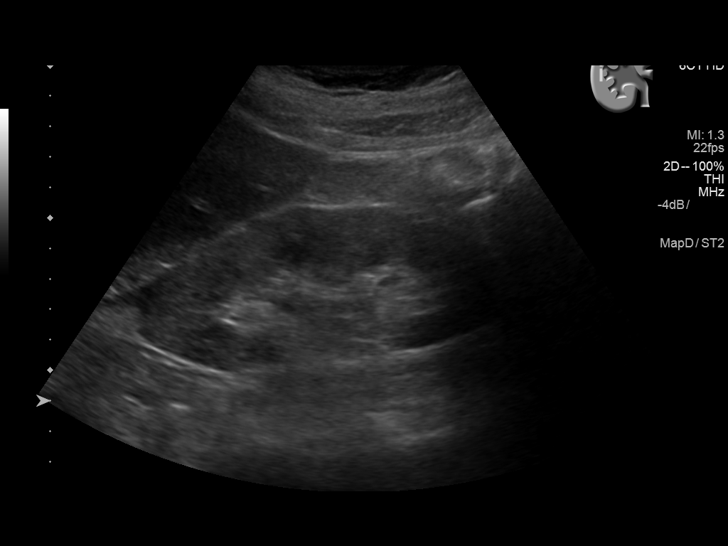
[im 13/51]
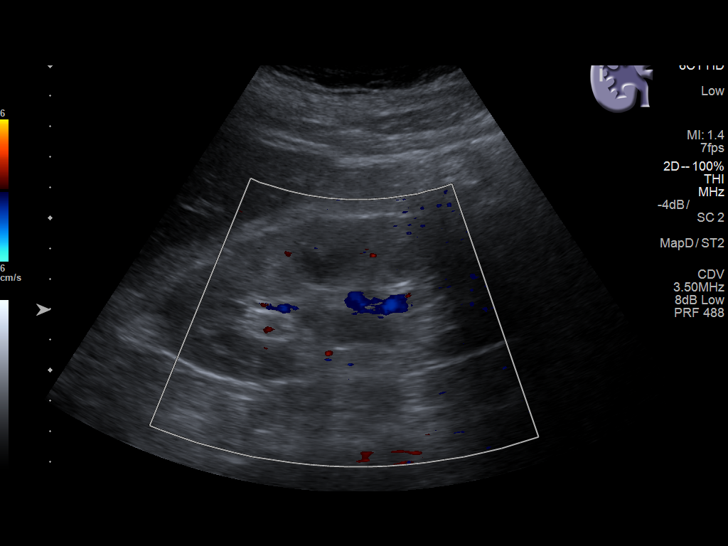
[im 17/51]
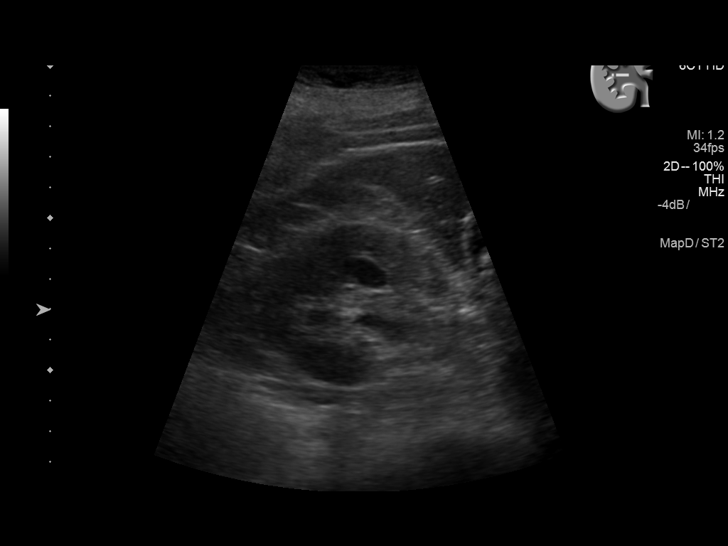
[im 19/51]
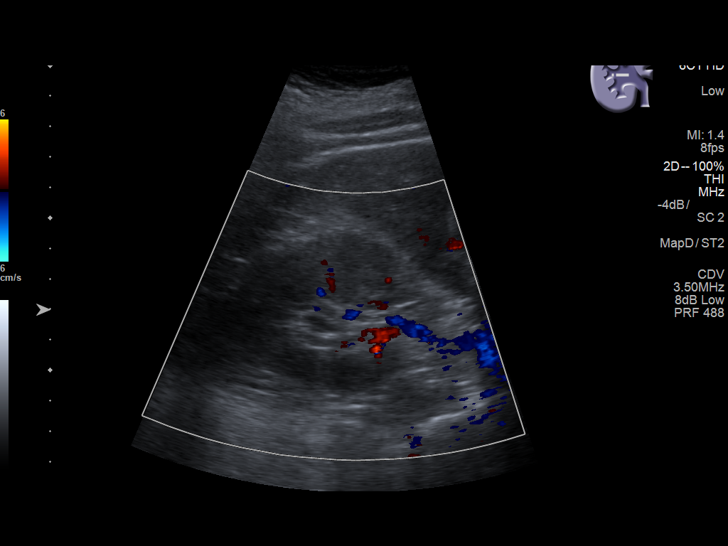
[im 23/51]
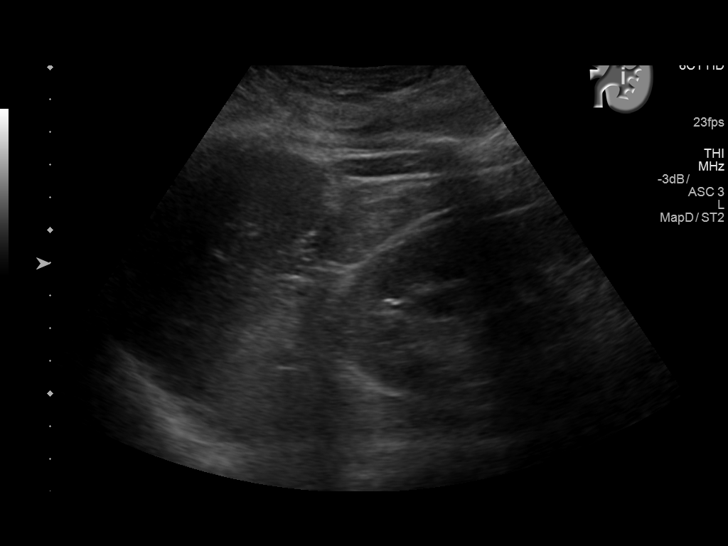
[im 28/51]
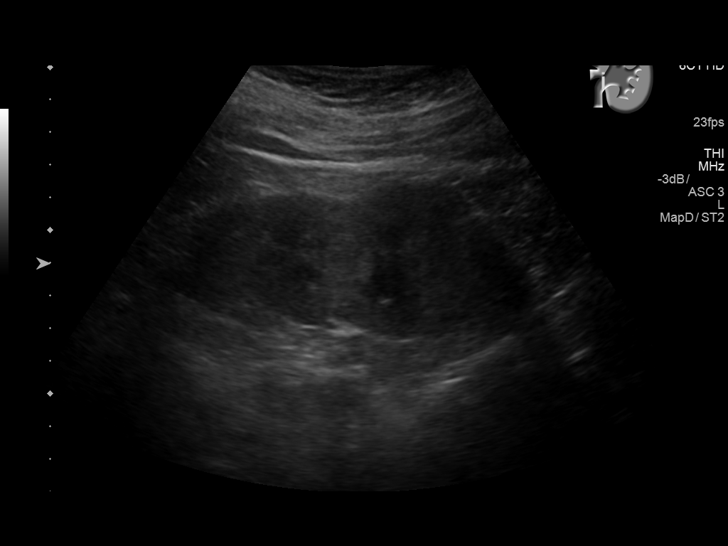
[im 32/51]
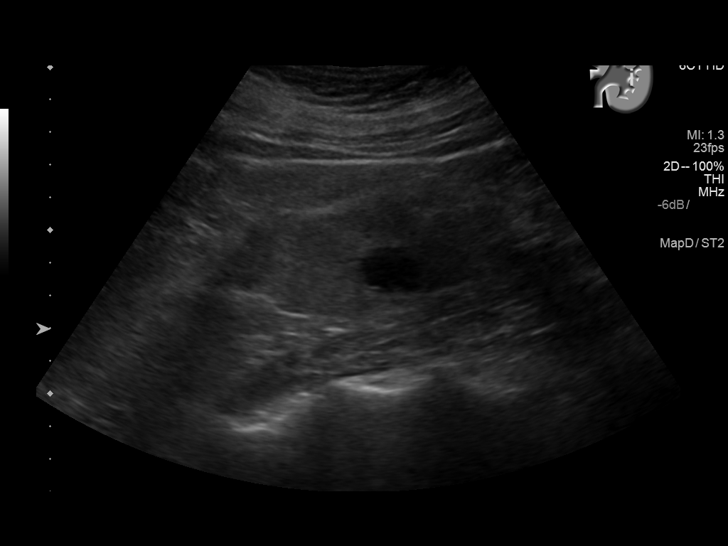
[im 34/51]
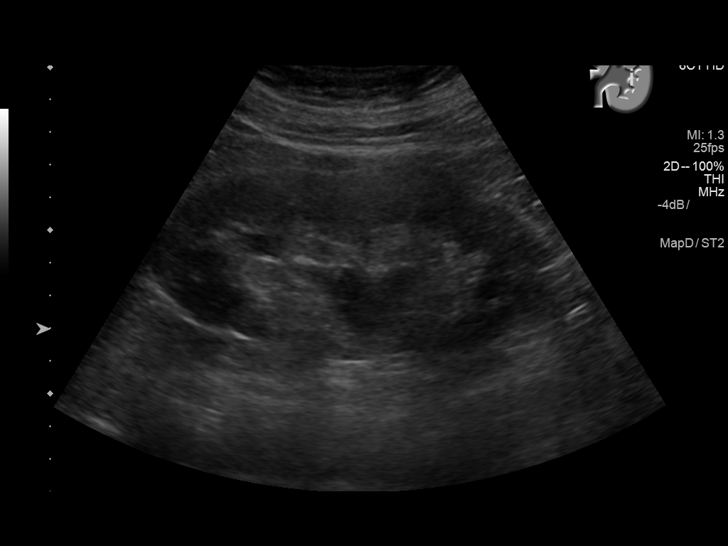
[im 38/51]
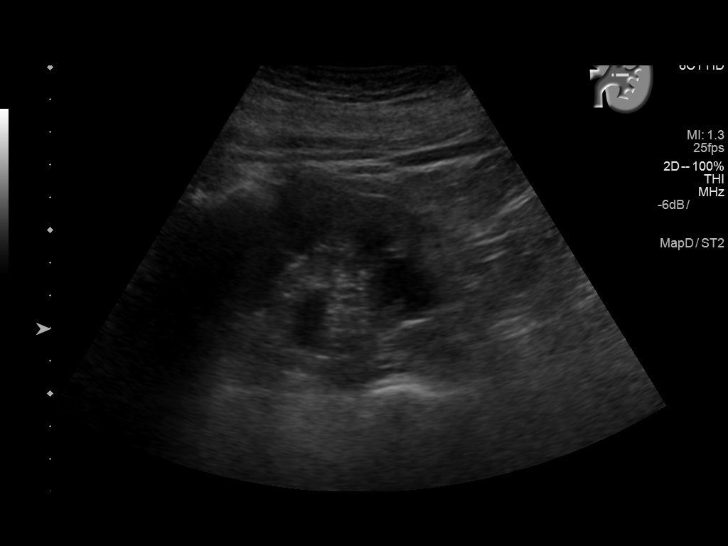
[im 42/51]
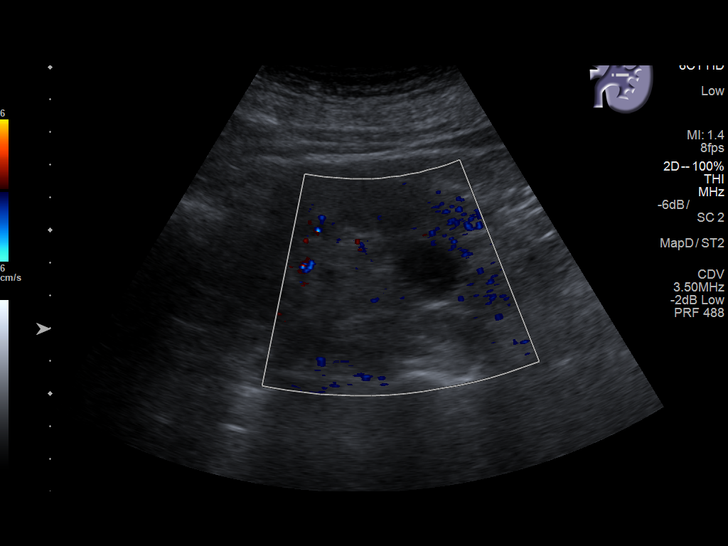
[im 46/51]
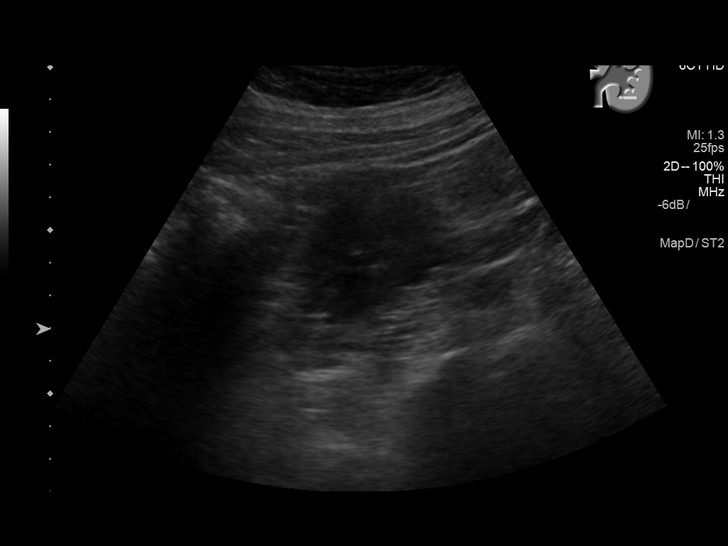
[im 51/51]
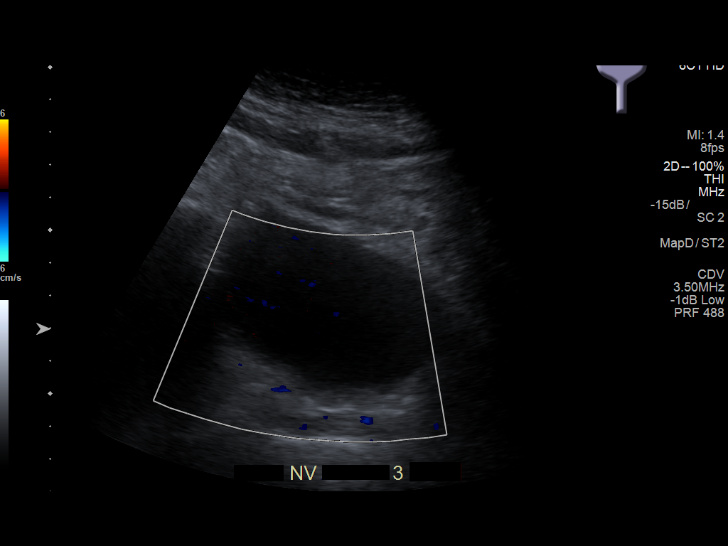

[14 of 25 positions shown; findings below may reference images not displayed]

FINDINGS: Right Kidney:

Length: 12.9 cm. Echogenic renal parenchyma. Trace perinephric
fluid. Mild hydronephrosis.

Left Kidney:

Length: 12.9 cm. Echogenic renal parenchyma. 2.3 x 1.7 x 2.0 cm
lower pole simple cyst. Mild hydronephrosis.

Bladder:

Within normal limits.
IMPRESSION: Mild hydronephrosis bilaterally.

2.3 cm left lower pole simple cyst.

Echogenic renal parenchyma, suggesting medical renal disease.

## 2015-11-29 ENCOUNTER — Inpatient Hospital Stay
Admission: EM | Admit: 2015-11-29 | Discharge: 2015-12-06 | DRG: 871 | Disposition: A | Discharge status: Skilled Nursing Facility | Payer: Medicare Other | Attending: Internal Medicine | Admitting: Internal Medicine

## 2015-11-29 ENCOUNTER — Emergency Department: Payer: Medicare Other

## 2015-11-29 ENCOUNTER — Encounter: Payer: Self-pay | Admitting: Emergency Medicine

## 2015-11-29 ENCOUNTER — Inpatient Hospital Stay: Admit: 2015-11-29 | Payer: Medicare Other

## 2015-11-29 DIAGNOSIS — E669 Obesity, unspecified: Secondary | ICD-10-CM | POA: Diagnosis present

## 2015-11-29 DIAGNOSIS — E1122 Type 2 diabetes mellitus with diabetic chronic kidney disease: Secondary | ICD-10-CM | POA: Diagnosis present

## 2015-11-29 DIAGNOSIS — R0902 Hypoxemia: Secondary | ICD-10-CM

## 2015-11-29 DIAGNOSIS — F17210 Nicotine dependence, cigarettes, uncomplicated: Secondary | ICD-10-CM | POA: Diagnosis present

## 2015-11-29 DIAGNOSIS — I129 Hypertensive chronic kidney disease with stage 1 through stage 4 chronic kidney disease, or unspecified chronic kidney disease: Secondary | ICD-10-CM | POA: Diagnosis not present

## 2015-11-29 DIAGNOSIS — Z66 Do not resuscitate: Secondary | ICD-10-CM | POA: Diagnosis not present

## 2015-11-29 DIAGNOSIS — Z7982 Long term (current) use of aspirin: Secondary | ICD-10-CM | POA: Diagnosis not present

## 2015-11-29 DIAGNOSIS — N183 Chronic kidney disease, stage 3 (moderate): Secondary | ICD-10-CM | POA: Diagnosis present

## 2015-11-29 DIAGNOSIS — L97919 Non-pressure chronic ulcer of unspecified part of right lower leg with unspecified severity: Secondary | ICD-10-CM | POA: Diagnosis present

## 2015-11-29 DIAGNOSIS — N1330 Unspecified hydronephrosis: Secondary | ICD-10-CM | POA: Diagnosis present

## 2015-11-29 DIAGNOSIS — N170 Acute kidney failure with tubular necrosis: Secondary | ICD-10-CM | POA: Diagnosis present

## 2015-11-29 DIAGNOSIS — H409 Unspecified glaucoma: Secondary | ICD-10-CM | POA: Diagnosis present

## 2015-11-29 DIAGNOSIS — N179 Acute kidney failure, unspecified: Secondary | ICD-10-CM

## 2015-11-29 DIAGNOSIS — J9601 Acute respiratory failure with hypoxia: Secondary | ICD-10-CM | POA: Diagnosis not present

## 2015-11-29 DIAGNOSIS — N289 Disorder of kidney and ureter, unspecified: Secondary | ICD-10-CM | POA: Diagnosis present

## 2015-11-29 DIAGNOSIS — E876 Hypokalemia: Secondary | ICD-10-CM | POA: Diagnosis not present

## 2015-11-29 DIAGNOSIS — A419 Sepsis, unspecified organism: Secondary | ICD-10-CM | POA: Diagnosis not present

## 2015-11-29 DIAGNOSIS — Z6835 Body mass index (BMI) 35.0-35.9, adult: Secondary | ICD-10-CM

## 2015-11-29 DIAGNOSIS — E785 Hyperlipidemia, unspecified: Secondary | ICD-10-CM | POA: Diagnosis present

## 2015-11-29 DIAGNOSIS — D631 Anemia in chronic kidney disease: Secondary | ICD-10-CM | POA: Diagnosis present

## 2015-11-29 DIAGNOSIS — N390 Urinary tract infection, site not specified: Secondary | ICD-10-CM | POA: Diagnosis not present

## 2015-11-29 DIAGNOSIS — I4891 Unspecified atrial fibrillation: Secondary | ICD-10-CM | POA: Diagnosis present

## 2015-11-29 DIAGNOSIS — Z91041 Radiographic dye allergy status: Secondary | ICD-10-CM

## 2015-11-29 DIAGNOSIS — R2240 Localized swelling, mass and lump, unspecified lower limb: Secondary | ICD-10-CM

## 2015-11-29 DIAGNOSIS — N3000 Acute cystitis without hematuria: Secondary | ICD-10-CM | POA: Diagnosis present

## 2015-11-29 DIAGNOSIS — I214 Non-ST elevation (NSTEMI) myocardial infarction: Secondary | ICD-10-CM | POA: Diagnosis present

## 2015-11-29 DIAGNOSIS — R000 Tachycardia, unspecified: Secondary | ICD-10-CM | POA: Diagnosis present

## 2015-11-29 DIAGNOSIS — L03115 Cellulitis of right lower limb: Secondary | ICD-10-CM | POA: Diagnosis present

## 2015-11-29 DIAGNOSIS — R6520 Severe sepsis without septic shock: Secondary | ICD-10-CM | POA: Diagnosis present

## 2015-11-29 DIAGNOSIS — B9620 Unspecified Escherichia coli [E. coli] as the cause of diseases classified elsewhere: Secondary | ICD-10-CM | POA: Diagnosis present

## 2015-11-29 DIAGNOSIS — J45909 Unspecified asthma, uncomplicated: Secondary | ICD-10-CM | POA: Diagnosis present

## 2015-11-29 DIAGNOSIS — I509 Heart failure, unspecified: Secondary | ICD-10-CM

## 2015-11-29 LAB — COMPREHENSIVE METABOLIC PANEL
ALBUMIN: 3.8 g/dL (ref 3.5–5.0)
ALK PHOS: 64 U/L (ref 38–126)
ALT: 24 U/L (ref 14–54)
ANION GAP: 17 — AB (ref 5–15)
AST: 80 U/L — ABNORMAL HIGH (ref 15–41)
BILIRUBIN TOTAL: 1.1 mg/dL (ref 0.3–1.2)
BUN: 40 mg/dL — AB (ref 6–20)
CALCIUM: 9.2 mg/dL (ref 8.9–10.3)
CO2: 23 mmol/L (ref 22–32)
CREATININE: 2.01 mg/dL — AB (ref 0.44–1.00)
Chloride: 104 mmol/L (ref 101–111)
GFR calc Af Amer: 29 mL/min — ABNORMAL LOW (ref >60)
GFR calc non Af Amer: 25 mL/min — ABNORMAL LOW (ref >60)
GLUCOSE: 124 mg/dL — AB (ref 65–99)
Potassium: 3.2 mmol/L — ABNORMAL LOW (ref 3.5–5.1)
Sodium: 144 mmol/L (ref 135–145)
TOTAL PROTEIN: 8.1 g/dL (ref 6.5–8.1)

## 2015-11-29 LAB — URINALYSIS COMPLETE WITH MICROSCOPIC (ARMC ONLY)
Bilirubin Urine: NEGATIVE
GLUCOSE, UA: NEGATIVE mg/dL
NITRITE: POSITIVE — AB
PROTEIN: 100 mg/dL — AB
SPECIFIC GRAVITY, URINE: 1.014 (ref 1.005–1.030)
Squamous Epithelial / LPF: NONE SEEN
pH: 5 (ref 5.0–8.0)

## 2015-11-29 LAB — CBC WITH DIFFERENTIAL/PLATELET
BASOS PCT: 0 %
Basophils Absolute: 0 10*3/uL (ref 0–0.1)
EOS ABS: 0 10*3/uL (ref 0–0.7)
EOS PCT: 0 %
HCT: 41 % (ref 35.0–47.0)
Hemoglobin: 13.5 g/dL (ref 12.0–16.0)
LYMPHS ABS: 0.2 10*3/uL — AB (ref 1.0–3.6)
Lymphocytes Relative: 1 %
MCH: 29.7 pg (ref 26.0–34.0)
MCHC: 33 g/dL (ref 32.0–36.0)
MCV: 90 fL (ref 80.0–100.0)
MONO ABS: 0.3 10*3/uL (ref 0.2–0.9)
Monocytes Relative: 2 %
NEUTROS ABS: 16.3 10*3/uL — AB (ref 1.4–6.5)
Neutrophils Relative %: 97 %
Platelets: 140 10*3/uL — ABNORMAL LOW (ref 150–440)
RBC: 4.55 MIL/uL (ref 3.80–5.20)
RDW: 15.2 % — AB (ref 11.5–14.5)
WBC: 16.8 10*3/uL — ABNORMAL HIGH (ref 3.6–11.0)

## 2015-11-29 LAB — TSH: TSH: 0.284 u[IU]/mL — ABNORMAL LOW (ref 0.350–4.500)

## 2015-11-29 LAB — LACTIC ACID, PLASMA
LACTIC ACID, VENOUS: 1.5 mmol/L (ref 0.5–2.0)
Lactic Acid, Venous: 2.6 mmol/L (ref 0.5–2.0)
Lactic Acid, Venous: 2.2 mmol/L (ref 0.5–2.0)

## 2015-11-29 LAB — MAGNESIUM: MAGNESIUM: 2 mg/dL (ref 1.7–2.4)

## 2015-11-29 LAB — TROPONIN I
Troponin I: 5.24 ng/mL — ABNORMAL HIGH (ref <0.031)
Troponin I: 6.47 ng/mL — ABNORMAL HIGH (ref <0.031)

## 2015-11-29 LAB — APTT: aPTT: 33 seconds (ref 24–36)

## 2015-11-29 LAB — PROTIME-INR
INR: 1.47
PROTHROMBIN TIME: 17.9 s — AB (ref 11.4–15.0)

## 2015-11-29 LAB — HEPARIN LEVEL (UNFRACTIONATED): HEPARIN UNFRACTIONATED: 0.43 [IU]/mL (ref 0.30–0.70)

## 2015-11-29 LAB — LIPASE, BLOOD: Lipase: 12 U/L (ref 11–51)

## 2015-11-29 IMAGING — DX DG CHEST 1V PORT
1 series · 1 of 1 positions shown · non-contrast
Comparison: [DATE]

CLINICAL DATA: Dizziness, tachycardia for 2 days, right leg
swelling

EXAM:
PORTABLE CHEST 1 VIEW

[chest ap]
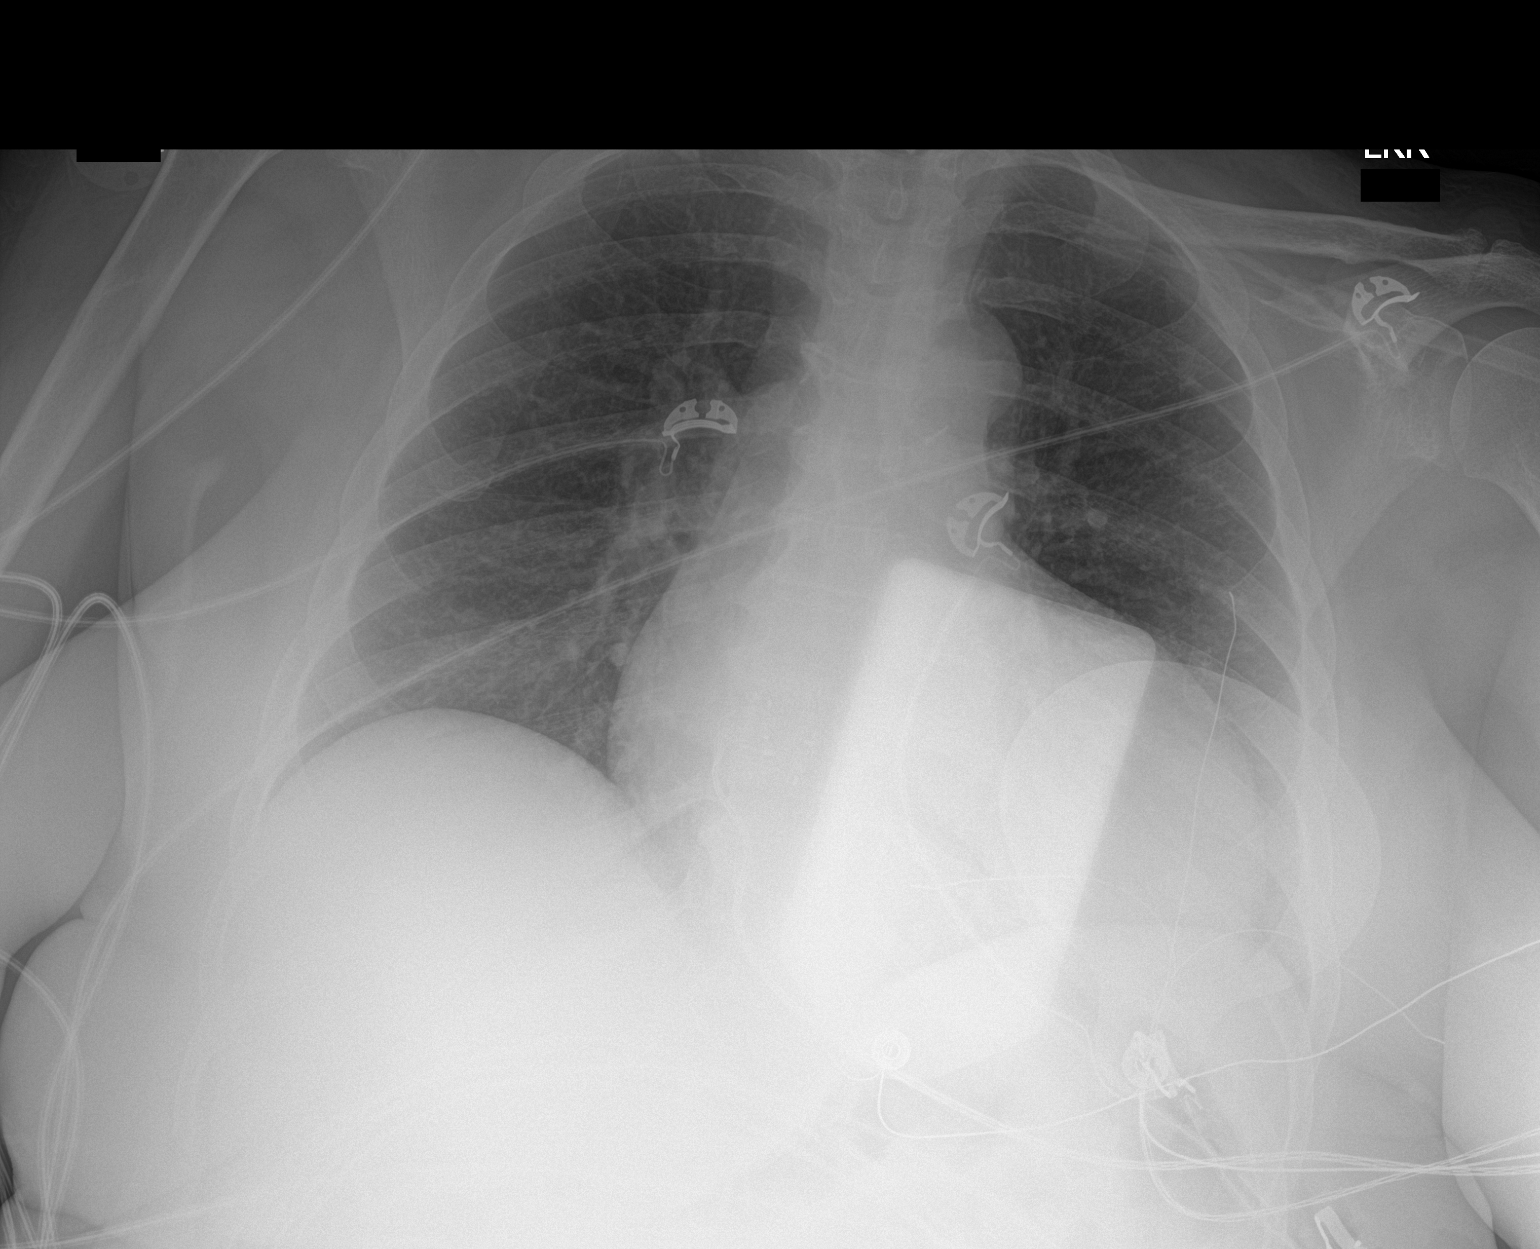

[1 of 1 positions shown; findings below may reference images not displayed]

FINDINGS: Borderline cardiomegaly. No acute infiltrate or pleural effusion. No
pulmonary edema. Mild elevation of the right hemidiaphragm. Mild
degenerative changes thoracic spine.
IMPRESSION: No active disease.  Borderline cardiomegaly.

## 2015-11-29 MED ORDER — LATANOPROST 0.005 % OP SOLN
1.0000 [drp] | Freq: Every day | OPHTHALMIC | Status: DC
  Administered 2015-11-30 – 2015-12-05 (×6): 1 [drp] via OPHTHALMIC
  Filled 2015-11-29: qty 2.5

## 2015-11-29 MED ORDER — AMIODARONE HCL IN DEXTROSE 360-4.14 MG/200ML-% IV SOLN
30.0000 mg/h | INTRAVENOUS | Status: DC
Start: 2015-11-30 — End: 2015-11-29

## 2015-11-29 MED ORDER — AMIODARONE HCL IN DEXTROSE 360-4.14 MG/200ML-% IV SOLN: 60.0000 mg/h | INTRAVENOUS | Status: DC

## 2015-11-29 MED ORDER — AMIODARONE HCL IN DEXTROSE 360-4.14 MG/200ML-% IV SOLN
INTRAVENOUS | Status: AC
  Filled 2015-11-29: qty 200

## 2015-11-29 MED ORDER — ASPIRIN 81 MG PO TABS: 81.0000 mg | ORAL_TABLET | Freq: Every day | ORAL | Status: DC

## 2015-11-29 MED ORDER — DILTIAZEM HCL 25 MG/5ML IV SOLN
20.0000 mg | Freq: Once | INTRAVENOUS | Status: AC
  Administered 2015-11-29: 20 mg via INTRAVENOUS

## 2015-11-29 MED ORDER — SODIUM CHLORIDE 0.9 % IV SOLN
INTRAVENOUS | Status: DC
  Administered 2015-11-30 (×2): via INTRAVENOUS

## 2015-11-29 MED ORDER — DILTIAZEM HCL 25 MG/5ML IV SOLN
INTRAVENOUS | Status: AC
  Administered 2015-11-29: 20 mg via INTRAVENOUS
  Filled 2015-11-29: qty 5

## 2015-11-29 MED ORDER — ALBUTEROL SULFATE (2.5 MG/3ML) 0.083% IN NEBU: 2.5000 mg | INHALATION_SOLUTION | Freq: Four times a day (QID) | RESPIRATORY_TRACT | Status: DC | PRN

## 2015-11-29 MED ORDER — PIPERACILLIN-TAZOBACTAM 3.375 G IVPB
3.3750 g | Freq: Three times a day (TID) | INTRAVENOUS | Status: DC
  Administered 2015-11-29 – 2015-12-01 (×6): 3.375 g via INTRAVENOUS
  Filled 2015-11-29 (×9): qty 50

## 2015-11-29 MED ORDER — SODIUM CHLORIDE 0.9 % IV SOLN
1.0000 g | Freq: Once | INTRAVENOUS | Status: AC
  Administered 2015-11-29: 1 g via INTRAVENOUS

## 2015-11-29 MED ORDER — SODIUM CHLORIDE 0.9 % IV BOLUS (SEPSIS)
1000.0000 mL | INTRAVENOUS | Status: AC
  Administered 2015-11-29 (×3): 1000 mL via INTRAVENOUS

## 2015-11-29 MED ORDER — ONDANSETRON HCL 4 MG PO TABS: 4.0000 mg | ORAL_TABLET | Freq: Four times a day (QID) | ORAL | Status: DC | PRN

## 2015-11-29 MED ORDER — ASPIRIN 81 MG PO CHEW
324.0000 mg | CHEWABLE_TABLET | Freq: Once | ORAL | Status: AC
  Administered 2015-11-29: 324 mg via ORAL
  Filled 2015-11-29: qty 4

## 2015-11-29 MED ORDER — ASPIRIN 81 MG PO CHEW
81.0000 mg | CHEWABLE_TABLET | Freq: Every day | ORAL | Status: DC
  Administered 2015-11-30 – 2015-12-06 (×7): 81 mg via ORAL
  Filled 2015-11-29 (×7): qty 1

## 2015-11-29 MED ORDER — DILTIAZEM HCL 25 MG/5ML IV SOLN: 25.0000 mg | Freq: Once | INTRAVENOUS | Status: DC

## 2015-11-29 MED ORDER — ALBUTEROL SULFATE HFA 108 (90 BASE) MCG/ACT IN AERS: 1.0000 | INHALATION_SPRAY | Freq: Four times a day (QID) | RESPIRATORY_TRACT | Status: DC | PRN

## 2015-11-29 MED ORDER — ASPIRIN 81 MG PO CHEW: 81.0000 mg | CHEWABLE_TABLET | Freq: Every day | ORAL | Status: DC

## 2015-11-29 MED ORDER — AMIODARONE HCL IN DEXTROSE 360-4.14 MG/200ML-% IV SOLN
30.0000 mg/h | INTRAVENOUS | Status: DC
  Administered 2015-11-29 – 2015-11-30 (×3): 30 mg/h via INTRAVENOUS
  Filled 2015-11-29 (×3): qty 200

## 2015-11-29 MED ORDER — ACETAMINOPHEN 325 MG PO TABS
650.0000 mg | ORAL_TABLET | Freq: Four times a day (QID) | ORAL | Status: DC | PRN
Start: 2015-11-29 — End: 2015-12-06
  Administered 2015-11-30 (×2): 650 mg via ORAL
  Filled 2015-11-29 (×2): qty 2

## 2015-11-29 MED ORDER — CALCIUM GLUCONATE 10 % IV SOLN
INTRAVENOUS | Status: AC
  Filled 2015-11-29: qty 10

## 2015-11-29 MED ORDER — LORATADINE 10 MG PO TABS: 10.0000 mg | ORAL_TABLET | Freq: Every day | ORAL | Status: DC

## 2015-11-29 MED ORDER — AMIODARONE HCL 150 MG/3ML IV SOLN: 60.0000 mg/h | Freq: Once | INTRAVENOUS | Status: DC

## 2015-11-29 MED ORDER — VANCOMYCIN HCL IN DEXTROSE 1-5 GM/200ML-% IV SOLN
1000.0000 mg | Freq: Once | INTRAVENOUS | Status: AC
  Administered 2015-11-29: 1000 mg via INTRAVENOUS
  Filled 2015-11-29: qty 200

## 2015-11-29 MED ORDER — AMIODARONE LOAD VIA INFUSION
150.0000 mg | Freq: Once | INTRAVENOUS | Status: AC
  Administered 2015-11-29: 150 mg via INTRAVENOUS
  Filled 2015-11-29: qty 83.34

## 2015-11-29 MED ORDER — ACETAMINOPHEN 500 MG PO TABS
ORAL_TABLET | ORAL | Status: AC
  Filled 2015-11-29: qty 2

## 2015-11-29 MED ORDER — AMIODARONE HCL IN DEXTROSE 360-4.14 MG/200ML-% IV SOLN
60.0000 mg/h | Freq: Once | INTRAVENOUS | Status: AC
  Administered 2015-11-29: 60 mg/h via INTRAVENOUS
  Filled 2015-11-29: qty 200

## 2015-11-29 MED ORDER — ACETAMINOPHEN 500 MG PO TABS
1000.0000 mg | ORAL_TABLET | Freq: Once | ORAL | Status: AC
  Administered 2015-11-29: 1000 mg via ORAL

## 2015-11-29 MED ORDER — DILTIAZEM HCL 100 MG IV SOLR
5.0000 mg/h | Freq: Once | INTRAVENOUS | Status: DC
  Filled 2015-11-29 (×2): qty 100

## 2015-11-29 MED ORDER — BRIMONIDINE TARTRATE 0.2 % OP SOLN
1.0000 [drp] | Freq: Two times a day (BID) | OPHTHALMIC | Status: DC
  Administered 2015-11-30 – 2015-12-06 (×13): 1 [drp] via OPHTHALMIC
  Filled 2015-11-29: qty 5

## 2015-11-29 MED ORDER — POTASSIUM CHLORIDE CRYS ER 20 MEQ PO TBCR
20.0000 meq | EXTENDED_RELEASE_TABLET | ORAL | Status: AC
  Administered 2015-11-29: 20 meq via ORAL
  Filled 2015-11-29: qty 1

## 2015-11-29 MED ORDER — NITROGLYCERIN 0.4 MG SL SUBL: 0.4000 mg | SUBLINGUAL_TABLET | SUBLINGUAL | Status: DC | PRN

## 2015-11-29 MED ORDER — ONDANSETRON HCL 4 MG/2ML IJ SOLN: 4.0000 mg | Freq: Four times a day (QID) | INTRAMUSCULAR | Status: DC | PRN

## 2015-11-29 MED ORDER — ATORVASTATIN CALCIUM 20 MG PO TABS
40.0000 mg | ORAL_TABLET | Freq: Every day | ORAL | Status: DC
  Administered 2015-11-29 – 2015-12-05 (×7): 40 mg via ORAL
  Filled 2015-11-29 (×7): qty 2

## 2015-11-29 MED ORDER — OXYCODONE HCL 5 MG PO TABS
5.0000 mg | ORAL_TABLET | ORAL | Status: DC | PRN
  Administered 2015-12-01 – 2015-12-06 (×13): 5 mg via ORAL
  Filled 2015-11-29 (×15): qty 1

## 2015-11-29 MED ORDER — VANCOMYCIN HCL IN DEXTROSE 1-5 GM/200ML-% IV SOLN
1000.0000 mg | INTRAVENOUS | Status: DC
  Filled 2015-11-29: qty 200

## 2015-11-29 MED ORDER — METOPROLOL TARTRATE 25 MG PO TABS
25.0000 mg | ORAL_TABLET | Freq: Two times a day (BID) | ORAL | Status: DC
  Administered 2015-11-29 – 2015-12-01 (×4): 25 mg via ORAL
  Filled 2015-11-29 (×4): qty 1

## 2015-11-29 MED ORDER — POTASSIUM CHLORIDE CRYS ER 20 MEQ PO TBCR: 20.0000 meq | EXTENDED_RELEASE_TABLET | Freq: Every day | ORAL | Status: DC

## 2015-11-29 MED ORDER — HEPARIN (PORCINE) IN NACL 100-0.45 UNIT/ML-% IJ SOLN
1100.0000 [IU]/h | INTRAMUSCULAR | Status: AC
  Administered 2015-11-29 – 2015-12-02 (×5): 1100 [IU]/h via INTRAVENOUS
  Filled 2015-11-29 (×11): qty 250

## 2015-11-29 MED ORDER — HEPARIN BOLUS VIA INFUSION
4050.0000 [IU] | Freq: Once | INTRAVENOUS | Status: AC
  Administered 2015-11-29: 4050 [IU] via INTRAVENOUS
  Filled 2015-11-29: qty 4050

## 2015-11-29 MED ORDER — ACETAMINOPHEN 650 MG RE SUPP: 650.0000 mg | Freq: Four times a day (QID) | RECTAL | Status: DC | PRN

## 2015-11-29 MED ORDER — PIPERACILLIN-TAZOBACTAM 3.375 G IVPB 30 MIN
3.3750 g | Freq: Once | INTRAVENOUS | Status: AC
  Administered 2015-11-29: 3.375 g via INTRAVENOUS
  Filled 2015-11-29: qty 50

## 2015-11-29 MED ORDER — METOPROLOL TARTRATE 25 MG PO TABS: 25.0000 mg | ORAL_TABLET | Freq: Two times a day (BID) | ORAL | Status: DC

## 2015-11-29 MED ORDER — HEPARIN SODIUM (PORCINE) 5000 UNIT/ML IJ SOLN
INTRAMUSCULAR | Status: AC
  Filled 2015-11-29: qty 1

## 2015-11-29 NOTE — Progress Notes (Signed)
Per documentation, amio gtt rate was 16.7 mL/hr, however the pump was set at 33.3 mL/hr. Clarified with pharmacist, Matt, that drip was suppose to be going at 16.7 mL/hr. Amio gtt rate was changed to 16.7 mL/hr, Baldo Ash, RN was second Psychologist, counselling.   Patricia Ewing

## 2015-11-29 NOTE — Progress Notes (Signed)
Patricia Ewing is a 65 y.o. female patient admitted from ED awake, alert - oriented  X 4 - no acute distress noted.  VSS - Blood pressure 138/72, pulse 101, temperature 98.5 F (36.9 C), temperature source Oral, resp. rate 18, height 5\' 6"  (1.676 m), weight 97.523 kg (215 lb), SpO2 100 %.    IV in place, occlusive dsg intact without redness.  Orientation to room, and floor completed with information packet given to patient/family. Admission INP armband ID verified with patient/family, and in place.   SR up x 2, fall assessment complete, with patient and family able to verbalize understanding of risk associated with falls, and verbalized understanding to call nsg before up out of bed.  Call light within reach, patient able to voice, and demonstrate understanding.  Skin, clean-dry- intact without evidence of bruising, or skin tears.   No evidence of skin break down noted on exam. Skin assessed with Juliann Pulse, RN. Tele box verified.      Will cont to eval and treat per MD orders.  Horton Finer, RN 11/29/2015 6:54 PM

## 2015-11-29 NOTE — Progress Notes (Addendum)
ANTICOAGULATION CONSULT NOTE - Initial Consult  Pharmacy Consult for heparin drip Indication: atrial fibrillation  Allergies  Allergen Reactions  . Contrast Media [Iodinated Diagnostic Agents] Other (See Comments)    Flushed, cough, anxiety, mild less responsiveness.   Patient Measurements: Height: 5\' 6"  (167.6 cm) Weight: 215 lb (97.523 kg) IBW/kg (Calculated) : 59.3 Heparin Dosing Weight: 81.1 kg  Vital Signs: Temp: 101.6 F (38.7 C) (05/01 1122) Temp Source: Oral (05/01 0935) BP: 149/113 mmHg (05/01 1149) Pulse Rate: 184 (05/01 1149)  Labs:  Recent Labs  11/29/15 0937  HGB 13.5  HCT 41.0  PLT 140*  APTT 33  LABPROT 17.9*  INR 1.47  CREATININE 2.01*  TROPONINI 5.24*   Estimated Creatinine Clearance: 32.9 mL/min (by C-G formula based on Cr of 2.01).  Medical History: Past Medical History  Diagnosis Date  . Hypertension   . Diabetes mellitus     diagnosed at age 32, on a "pill" (?metformin), but now diet-controlled  . Glaucoma   . Hyperlipidemia     managed with lifestyle modification  . Tobacco abuse     No PFT's or diagnosis of COPD   Assessment: Pharmacy consulted to dose and monitor heparin drip in this 65 year old female for new onset atrial fibrillation. Patient was not taking any anticoagulants prior to admission per med rec. Baseline labs were obtained in the ED.  Hgb 13.5 Plt 140  INR 1.47 Troponin 5.24  APTT 33  Goal of Therapy:  Heparin level 0.3-0.7 units/ml Monitor platelets by anticoagulation protocol: Yes   Plan:  Give 4050 units bolus x 1 (~50 units/kg) Start heparin infusion at 1100 units/hr (~14 units/kg/hr) Check anti-Xa level in 6 hours and daily while on heparin Continue to monitor H&H and platelets  Thank you for the consult.  Lenis Noon, PharmD Clinical Pharmacist 11/29/2015,12:38 PM

## 2015-11-29 NOTE — Progress Notes (Signed)
MEDICATION RELATED CONSULT NOTE - INITIAL   Pharmacy Consult for monitoring/recommending change in therapy secondary to drug interactions with amiodarone Indication: New start amiodarone  Allergies  Allergen Reactions  . Contrast Media [Iodinated Diagnostic Agents] Other (See Comments)    Flushed, cough, anxiety, mild less responsiveness.   Patient Measurements: Height: 5\' 6"  (167.6 cm) Weight: 219 lb 4.8 oz (99.474 kg) IBW/kg (Calculated) : 59.3  Vital Signs: Temp: 98.5 F (36.9 C) (05/01 1725) Temp Source: Oral (05/01 1725) BP: 138/72 mmHg (05/01 1725) Pulse Rate: 101 (05/01 1725) Intake/Output from previous day:   Intake/Output from this shift:    Labs:  Recent Labs  11/29/15 0937  WBC 16.8*  HGB 13.5  HCT 41.0  PLT 140*  APTT 33  CREATININE 2.01*  ALBUMIN 3.8  PROT 8.1  AST 80*  ALT 24  ALKPHOS 64  BILITOT 1.1   Estimated Creatinine Clearance: 33.2 mL/min (by C-G formula based on Cr of 2.01).   Microbiology: No results found for this or any previous visit (from the past 720 hour(s)).  Medications:  Scheduled:  . acetaminophen      . [START ON 11/30/2015] aspirin  81 mg Oral Daily  . atorvastatin  40 mg Oral q1800  . brimonidine  1 drop Both Eyes BID  . calcium gluconate      . heparin      . latanoprost  1 drop Both Eyes QHS  . [START ON 11/30/2015] loratadine  10 mg Oral Daily  . metoprolol tartrate  25 mg Oral BID  . piperacillin-tazobactam (ZOSYN)  IV  3.375 g Intravenous Q8H  . [START ON 11/30/2015] potassium chloride SA  20 mEq Oral Daily  . potassium chloride  20 mEq Oral Q2H  . vancomycin  1,000 mg Intravenous Q24H   Infusions:  . sodium chloride 125 mL/hr at 11/29/15 1818  . amiodarone 30 mg/hr (11/29/15 1838)  . heparin 1,100 Units/hr (11/29/15 1317)   PRN: acetaminophen **OR** acetaminophen, albuterol, nitroGLYCERIN, ondansetron **OR** ondansetron (ZOFRAN) IV, oxyCODONE  Assessment: Pharmacy consulted to monitor medication list and make  recommendations regarding dose adjustments secondary to drug-drug interactions with amiodarone.  1. Amiodarone and ondansetron - risk of prolonged QTc 2. Amiodarone and loratadine - risk of prolonged QTc 3. Amiodarone and atorvastatin - risk of increased atorvastatin concentration  Plan:  Patient admitted to telemetry unit and is being monitored  Recommend close monitoring of QTc. Ondansetron is ordered PRN for nausea and no doses have been charted at this time. Recommend discontinue loratadine as she was taking PRN for allergies prior to admission.  Monitor LFTs and for myalgia with atorvastatin  Thank you for the consult.   Darylene Price Umer Harig 11/29/2015,7:13 PM

## 2015-11-29 NOTE — Progress Notes (Signed)
ELECTROLYTE CONSULT NOTE - INITIAL   Pharmacy Consult for electrolyte monitoring and replacement Indication: hypokalemia  Allergies  Allergen Reactions  . Contrast Media [Iodinated Diagnostic Agents] Other (See Comments)    Flushed, cough, anxiety, mild less responsiveness.   Patient Measurements: Height: 5\' 6"  (167.6 cm) Weight: 215 lb (97.523 kg) IBW/kg (Calculated) : 59.3  Vital Signs: Temp: 99.2 F (37.3 C) (05/01 1542) Temp Source: Oral (05/01 1542) BP: 164/79 mmHg (05/01 1500) Pulse Rate: 110 (05/01 1500) Intake/Output from previous day:   Intake/Output from this shift: Total I/O In: 3240 [P.O.:240; I.V.:3000] Out: 600 [Urine:600]  Estimated Creatinine Clearance: 32.9 mL/min (by C-G formula based on Cr of 2.01).  Medical History: Past Medical History  Diagnosis Date  . Hypertension   . Diabetes mellitus     diagnosed at age 69, on a "pill" (?metformin), but now diet-controlled  . Glaucoma   . Hyperlipidemia     managed with lifestyle modification  . Tobacco abuse     No PFT's or diagnosis of COPD   Assessment: Pharmacy consulted to monitor and replace electrolytes if needed in this 65 year old female admitted with sepsis and new onset atrial fibrillation. Potassium of 3.2 is low on admission. Have ordered an add-on Mg. Patient was taking potassium chloride 20 mEq daily prior to admission which will be resumed.   Goal of Therapy:  Electrolytes within normal limits  Plan:  Order KCl 20 mEq PO every 2 hours for a total of 2 doses.  Will check Mg/K/Phos with AM labs tomorrow. Thank you for allowing pharmacy to be part of this patient's care.   Lenis Noon, PharmD Clinical Pharmacist 11/29/2015,4:09 PM

## 2015-11-29 NOTE — ED Provider Notes (Signed)
[facility] Emergency Department Provider Note  ____________________________________________  Time seen: 9:25 AM on arrival by EMS  I have reviewed the triage vital signs and the nursing notes.   HISTORY  Chief Complaint Weakness    HPI Patricia Ewing is a 65 y.o. female complaining of dizziness and fatigue for the past 2 days. She also had an episode of passing out at home. Also having right leg swelling and redness which she states has been there for at least a week. Denies any trauma or recent surgeries or hospitalizations. Denies chest pain or shortness of breath. Denies a history of A. Fib.  Patient was found to have A. fib with rapid ventricular response by EMS in the prehospital setting. They gave 10 mg IV diltiazem without effect.     Past Medical History  Diagnosis Date  . Hypertension   . Diabetes mellitus     diagnosed at age 58, on a "pill" (?metformin), but now diet-controlled  . Glaucoma   . Hyperlipidemia     managed with lifestyle modification  . Tobacco abuse     No PFT's or diagnosis of COPD     Patient Active Problem List   Diagnosis Date Noted  . Sepsis ([place]) 11/29/2015  . Acute chest pain 11/30/2011  . HTN (hypertension) 11/30/2011  . Bilateral leg edema 11/30/2011     Past Surgical History  Procedure Laterality Date  . Abdominal hysterectomy  1984     Current Outpatient Rx  Name  Route  Sig  Dispense  Refill  . albuterol (PROVENTIL HFA;VENTOLIN HFA) 108 (90 BASE) MCG/ACT inhaler   Inhalation   Inhale 1 puff into the lungs every 6 (six) hours as needed. Shortness of breath         . aspirin 81 MG tablet   Oral   Take 81 mg by mouth daily.         . brimonidine (ALPHAGAN) 0.2 % ophthalmic solution   Both Eyes   Place 1 drop into both eyes 2 (two) times daily.         . cetirizine (ZYRTEC) 10 MG tablet   Oral   Take 10 mg by mouth daily as needed. allergies         . cloNIDine (CATAPRES) 0.2 MG  tablet   Oral   Take 0.2 mg by mouth daily.         . furosemide (LASIX) 40 MG tablet   Oral   Take 40 mg by mouth 2 (two) times daily.      3   . latanoprost (XALATAN) 0.005 % ophthalmic solution   Both Eyes   Place 1 drop into both eyes.         . potassium chloride SA (K-DUR,KLOR-CON) 20 MEQ tablet   Oral   Take 20 mEq by mouth daily.            Allergies Contrast media   Family History  Problem Relation Age of Onset  . Heart attack Father 53  . Lymphoma Son   . Colon cancer Other     Social History Social History  Substance Use Topics  . Smoking status: Current Every Day Smoker -- 0.50 packs/day for 40 years    Types: Cigarettes  . Smokeless tobacco: None  . Alcohol Use: 0.5 - 1.0 oz/week    1-2 drink(s) per week    Review of Systems  Constitutional:   No fever or chills.  Eyes:   No vision changes.  ENT:   No sore throat. No rhinorrhea. Cardiovascular:   No chest pain. Respiratory:   No dyspnea or cough. Gastrointestinal:   Negative for abdominal pain, vomiting and diarrhea.  Genitourinary:   Negative for dysuria or difficulty urinating. Musculoskeletal:   Right leg pain and swelling Neurological:   Negative for headaches. Positive dizziness 10-point ROS otherwise negative.  ____________________________________________   PHYSICAL EXAM:  VITAL SIGNS: ED Triage Vitals  Enc Vitals Group     BP --      Pulse --      Resp --      Temp --      Temp src --      SpO2 --      Weight --      Height --      Head Cir --      Peak Flow --      Pain Score --      Pain Loc --      Pain Edu? --      Excl. in [place]? --     Vital signs reviewed, nursing assessments reviewed.   Constitutional:   Alert and oriented. Ill-appearing. Eyes:   No scleral icterus. No conjunctival pallor. PERRL. EOMI.  No nystagmus. ENT   Head:   Normocephalic and atraumatic.   Nose:   No congestion/rhinnorhea. No septal hematoma   Mouth/Throat:   Dry  mucous membranes, no pharyngeal erythema. No peritonsillar mass.    Neck:   No stridor. No SubQ emphysema. No meningismus. Hematological/Lymphatic/Immunilogical:   No cervical lymphadenopathy. Cardiovascular:   Irregularly irregular rhythm, tachycardia heart rate about 200. Symmetric bilateral radial and DP pulses.  No murmurs.  Respiratory:   Normal respiratory effort without tachypnea nor retractions. Breath sounds are clear and equal bilaterally. No wheezes/rales/rhonchi. Gastrointestinal:   Soft and nontender. Non distended. There is no CVA tenderness.  No rebound, rigidity, or guarding. Genitourinary:   deferred Musculoskeletal:   Erythema with hyperpigmentation of the right lower extremity. No induration. There is calf tenderness. No palpable cord. Skin is not warm to the touch. No drainage. Other extremity is unremarkable, no significant edema. Neurologic:   Normal speech and language.  CN 2-10 normal. Motor grossly intact. No gross focal neurologic deficits are appreciated.  Skin:    Skin is warm, dry and intact. No rash noted.  No petechiae, purpura, or bullae.  ____________________________________________    LABS (pertinent positives/negatives) (all labs ordered are listed, but only abnormal results are displayed) Labs Reviewed  COMPREHENSIVE METABOLIC PANEL - Abnormal; Notable for the following:    Potassium 3.2 (*)    Glucose, Bld 124 (*)    BUN 40 (*)    Creatinine, Ser 2.01 (*)    AST 80 (*)    GFR calc non Af Amer 25 (*)    GFR calc Af Amer 29 (*)    Anion gap 17 (*)    All other components within normal limits  TROPONIN I - Abnormal; Notable for the following:    Troponin I 5.24 (*)    All other components within normal limits  CBC WITH DIFFERENTIAL/PLATELET - Abnormal; Notable for the following:    WBC 16.8 (*)    RDW 15.2 (*)    Platelets 140 (*)    Neutro Abs 16.3 (*)    Lymphs Abs 0.2 (*)    All other components within normal limits  URINALYSIS  COMPLETEWITH MICROSCOPIC ([facility] ONLY) - Abnormal; Notable for the following:    Color, Urine  YELLOW (*)    APPearance HAZY (*)    Ketones, ur TRACE (*)    Hgb urine dipstick 3+ (*)    Protein, ur 100 (*)    Nitrite POSITIVE (*)    Leukocytes, UA TRACE (*)    Bacteria, UA MANY (*)    All other components within normal limits  LACTIC ACID, PLASMA - Abnormal; Notable for the following:    Lactic Acid, Venous 2.6 (*)    All other components within normal limits  PROTIME-INR - Abnormal; Notable for the following:    Prothrombin Time 17.9 (*)    All other components within normal limits  TSH - Abnormal; Notable for the following:    TSH 0.284 (*)    All other components within normal limits  LACTIC ACID, PLASMA - Abnormal; Notable for the following:    Lactic Acid, Venous 2.2 (*)    All other components within normal limits  CULTURE, BLOOD (ROUTINE X 2)  CULTURE, BLOOD (ROUTINE X 2)  CULTURE, EXPECTORATED SPUTUM-ASSESSMENT  URINE CULTURE  LIPASE, BLOOD  APTT  LACTIC ACID, PLASMA  HEPARIN LEVEL (UNFRACTIONATED)   ____________________________________________   EKG  Interpreted by me Atrial fibrillation rate of 202, indeterminate access due to likely limb lead reversal, likely left axis.. Normal intervals, narrow complex. Unremarkable ST segments and T waves. No acute ischemic changes.  EMS EKGs reviewed by me as well. No complex, irregularly irregular consistent with atrial fibrillation, heart rate of 196. Left axis.  ____________________________________________    RADIOLOGY  Chest x-ray unremarkable. Ultrasound right lower extremity unremarkable. ____________________________________________   PROCEDURES  CRITICAL CARE Performed by: Joni Fears, Rajinder Mesick   Total critical care time: 35 minutes  Critical care time was exclusive of separately billable procedures and treating other patients.  Critical care was necessary to treat or prevent imminent or life-threatening  deterioration.  Critical care was time spent personally by me on the following activities: development of treatment plan with patient and/or surrogate as well as nursing, discussions with consultants, evaluation of patient's response to treatment, examination of patient, obtaining history from patient or surrogate, ordering and performing treatments and interventions, ordering and review of laboratory studies, ordering and review of radiographic studies, pulse oximetry and re-evaluation of patient's condition.  ____________________________________________   INITIAL IMPRESSION / ASSESSMENT AND PLAN / ED COURSE  Pertinent labs & imaging results that were available during my care of the patient were reviewed by me and considered in my medical decision making (see chart for details).  Patient presents with fever to 103, atrial fibrillation with tachycardia with a rate of about 200. Blood pressure is stable. There is a high degree of clinical uncertainty at this time. Biggest concerns right now our sepsis possibly due to cellulitis of right lower extremity versus PE with right lower extremity DVT versus thyroid storm versus new onset atrial fibrillation with rapid ventricular response. We'll begin with IV vancomycin and Zosyn and fluids while getting chest x-ray and laboratory workup. Code sepsis initiated.    ----------------------------------------- 12:19 PM on 11/29/2015 ----------------------------------------- After initial assessment, code sepsis initiated due to fever of 103, tachycardia. Chest x-ray confirmed clear lungs without any pulmonary compromise, no evidence of pneumonia. Heart rate did not improve with IV fluids, so she was given IV diltiazem 20 mg. This produced no effect. I then received the troponin result at about 11:15 of 5.2. This may be related to demand ischemia because of the severe tachycardia. I discussed the case with cardiology Dr. Chancy Milroy who recommended an amiodarone  bolus  and drip.    ----------------------------------------- 2:31 PM on 11/29/2015 -----------------------------------------  After amiodarone 150 mg bolus, patient cardioverted pharmacologically to sinus tachycardia. Continue fluids, continue amiodarone loading infusion. Case was discussed with hospitalist for admission. Because she cardioverted, she does not require ICU admission at this time. Workup also significant for an elevated lactate at 2.6 which has improved to 2.2 on serial trending.    ____________________________________________   FINAL CLINICAL IMPRESSION(S) / ED DIAGNOSES  Final diagnoses:  NSTEMI (non-ST elevated myocardial infarction) ([place])  New onset atrial fibrillation (H[facility])  Atrial fibrillation with rapid ventricular response (HCC)  Sepsis, due to unspecified organism [facility])  Acute cystitis without hematuria  Acute renal insufficiency       Portions of this note were generated with dragon dictation software. Dictation errors may occur despite best attempts at proofreading.   Carrie Mew, MD 11/29/15 [phone]

## 2015-11-29 NOTE — ED Notes (Signed)
Unable to produce sputum, pt oriented, fatigued, restless in bed, denies pain. States does not feel better or worse than arrival, Husband at bedside.

## 2015-11-29 NOTE — ED Notes (Signed)
Clarification with Dr. Humphrey Rolls, continue Amiodorone protocol.

## 2015-11-29 NOTE — Progress Notes (Signed)
Pharmacy Antibiotic Note  Patricia Ewing is a 65 y.o. female admitted on 11/29/2015 with sepsis.  Pharmacy has been consulted for vancomycin & piperacillin/tazobactam dosing.  Plan: Piperacillin/tazobactam 3.375 g IV q8h EI  Patient received vancomycin 1000 mg IV x 1 dose in ED at ~1030 this morning. Will order vancomycin 1000 mg IV q24h (with stacked dose to begin 6 hours after initial dose @ 1700 this evening)  Goal vancomycin trough 15-20 mcg/mL Vancomycin trough ordered for 5/4 @ 1630 which is prior to the 5th dose and should represent steady state.  Kinetics: Using adjusted body weight of 74.5 kg Ke: 0.032 Half-life: 21.7 hrs Vd: 52 L Cmin (estimated) ~17.5 mcg/mL  Height: 5\' 6"  (167.6 cm) Weight: 215 lb (97.523 kg) IBW/kg (Calculated) : 59.3  Temp (24hrs), Avg:102.3 F (39.1 C), Min:101.6 F (38.7 C), Max:103 F (39.4 C)   Recent Labs Lab 11/29/15 0937 11/29/15 0944  WBC 16.8*  --   CREATININE 2.01*  --   LATICACIDVEN  --  2.6*    Estimated Creatinine Clearance: 32.9 mL/min (by C-G formula based on Cr of 2.01).    Allergies  Allergen Reactions  . Contrast Media [Iodinated Diagnostic Agents] Other (See Comments)    Flushed, cough, anxiety, mild less responsiveness.   Antimicrobials this admission: vancomycin 5/1 >>  Piperacillin/tazobactam 5/1 >>   Dose adjustments this admission:  Microbiology results: 5/1 BCx: Sent 5/1 UCx: Sent  5/1 Sputum: Sent   Thank you for allowing pharmacy to be a part of this patient's care.  Lenis Noon, PharmD Clinical Pharmacist 11/29/2015 12:52 PM

## 2015-11-29 NOTE — Progress Notes (Signed)
ANTICOAGULATION CONSULT NOTE - Initial Consult  Pharmacy Consult for heparin drip Indication: atrial fibrillation  Allergies  Allergen Reactions  . Contrast Media [Iodinated Diagnostic Agents] Other (See Comments)    Flushed, cough, anxiety, mild less responsiveness.   Patient Measurements: Height: 5\' 6"  (167.6 cm) Weight: 219 lb 4.8 oz (99.474 kg) IBW/kg (Calculated) : 59.3 Heparin Dosing Weight: 81.1 kg  Vital Signs: Temp: 99.5 F (37.5 C) (05/01 2006) Temp Source: Oral (05/01 2006) BP: 148/89 mmHg (05/01 2006) Pulse Rate: 101 (05/01 2006)  Labs:  Recent Labs  11/29/15 0937 11/29/15 1935 11/29/15 1946  HGB 13.5  --   --   HCT 41.0  --   --   PLT 140*  --   --   APTT 33  --   --   LABPROT 17.9*  --   --   INR 1.47  --   --   HEPARINUNFRC  --   --  0.43  CREATININE 2.01*  --   --   TROPONINI 5.24* 6.47*  --    Estimated Creatinine Clearance: 33.2 mL/min (by C-G formula based on Cr of 2.01).  Medical History: Past Medical History  Diagnosis Date  . Hypertension   . Diabetes mellitus     diagnosed at age 45, on a "pill" (?metformin), but now diet-controlled  . Glaucoma   . Hyperlipidemia     managed with lifestyle modification  . Tobacco abuse     No PFT's or diagnosis of COPD   Assessment: Pharmacy consulted to dose and monitor heparin drip in this 65 year old female for new onset atrial fibrillation. Patient was not taking any anticoagulants prior to admission per med rec. Baseline labs were obtained in the ED.  Hgb 13.5 Plt 140  INR 1.47 Troponin 5.24  APTT 33  Goal of Therapy:  Heparin level 0.3-0.7 units/ml Monitor platelets by anticoagulation protocol: Yes   Plan:  Give 4050 units bolus x 1 (~50 units/kg) Start heparin infusion at 1100 units/hr (~14 units/kg/hr) Check anti-Xa level in 6 hours and daily while on heparin Continue to monitor H&H and platelets  5/01:  HL @ 19:46 = 0.43.  Will continue this pt on current rate of 1100 units/hr and  recheck HL on 5/2 @ 2:00.   Thank you for the consult.  Orene Desanctis, PharmD Clinical Pharmacist 11/29/2015,8:31 PM

## 2015-11-29 NOTE — ED Notes (Signed)
Syncopal episode home, weakness, states has felt bad x 2 days, R leg swollen and redness, states has been x 1 week.

## 2015-11-29 NOTE — H&P (Signed)
[facility] NAME: Patricia Ewing    MR#:  [id number]  DATE OF BIRTH:  19-Sep-1950  DATE OF ADMISSION:  11/29/2015  PRIMARY CARE PHYSICIAN: Madelyn Brunner, MD   REQUESTING/REFERRING PHYSICIAN: Dr. Joni Fears  CHIEF COMPLAINT:   Feeling the Z week for last several days  HISTORY OF PRESENT ILLNESS:  Patricia Ewing  is a 65 y.o. female with a known history of hypertension, obesity, tobacco abuse, asthma comes to the emergency room with complaints of feeling dizziness and weak. Patient also had fever and leg swelling. She has right lower extremity redness of the leg for last 1 month. She was found to be in A. fib with RVR heart rate was in the 180s. She was started on  amiodarone drip after bolus was given. She is now converted to sinus tachycardia. Patient received IV vancomycin and Zosyn for sepsis secondary to UTI. Her lactic acid was elevated to 2.6. She also has acute renal failure creatinine is 2.09. Baseline creatinine is 1.09. Patient also was found to have elevated troponin of 5.2. She is being admitted with sepsis, acute new onset A. fib, acute non-Q-wave MI and recent acute renal failure.  PAST MEDICAL HISTORY:   Past Medical History  Diagnosis Date  . Hypertension   . Diabetes mellitus     diagnosed at age 62, on a "pill" (?metformin), but now diet-controlled  . Glaucoma   . Hyperlipidemia     managed with lifestyle modification  . Tobacco abuse     No PFT's or diagnosis of COPD    PAST SURGICAL HISTOIRY:   Past Surgical History  Procedure Laterality Date  . Abdominal hysterectomy  1984    SOCIAL HISTORY:   Social History  Substance Use Topics  . Smoking status: Current Every Day Smoker -- 0.50 packs/day for 40 years    Types: Cigarettes  . Smokeless tobacco: Not on file  . Alcohol Use: 0.5 - 1.0 oz/week    1-2 drink(s) per week    FAMILY HISTORY:   Family History  Problem Relation Age of Onset   . Heart attack Father 69  . Lymphoma Son   . Colon cancer Other     DRUG ALLERGIES:   Allergies  Allergen Reactions  . Contrast Media [Iodinated Diagnostic Agents] Other (See Comments)    Flushed, cough, anxiety, mild less responsiveness.    REVIEW OF SYSTEMS:  Review of Systems  Constitutional: Positive for fever. Negative for chills and weight loss.  HENT: Negative for ear discharge, ear pain and nosebleeds.   Eyes: Negative for blurred vision, pain and discharge.  Respiratory: Positive for shortness of breath. Negative for sputum production, wheezing and stridor.   Cardiovascular: Positive for palpitations and leg swelling. Negative for chest pain, orthopnea and PND.  Gastrointestinal: Negative for nausea, vomiting, abdominal pain and diarrhea.  Genitourinary: Positive for dysuria. Negative for urgency and frequency.  Musculoskeletal: Negative for back pain and joint pain.  Neurological: Positive for weakness. Negative for sensory change, speech change and focal weakness.  Psychiatric/Behavioral: Negative for depression and hallucinations. The patient is not nervous/anxious.   All other systems reviewed and are negative.    MEDICATIONS AT HOME:   Prior to Admission medications   Medication Sig Start Date End Date Taking? Authorizing Provider  albuterol (PROVENTIL HFA;VENTOLIN HFA) 108 (90 BASE) MCG/ACT inhaler Inhale 1 puff into the lungs every 6 (six) hours as needed. Shortness of breath   Yes Historical  Provider, MD  aspirin 81 MG tablet Take 81 mg by mouth daily.   Yes Historical Provider, MD  brimonidine (ALPHAGAN) 0.2 % ophthalmic solution Place 1 drop into both eyes 2 (two) times daily.   Yes Historical Provider, MD  cetirizine (ZYRTEC) 10 MG tablet Take 10 mg by mouth daily as needed. allergies   Yes Historical Provider, MD  cloNIDine (CATAPRES) 0.2 MG tablet Take 0.2 mg by mouth daily.   Yes Historical Provider, MD  furosemide (LASIX) 40 MG tablet Take 40 mg by mouth  2 (two) times daily.   Yes Historical Provider, MD  latanoprost (XALATAN) 0.005 % ophthalmic solution Place 1 drop into both eyes.   Yes Historical Provider, MD  potassium chloride SA (K-DUR,KLOR-CON) 20 MEQ tablet Take 20 mEq by mouth daily.   Yes Historical Provider, MD      VITAL SIGNS:  Blood pressure 154/110, pulse 118, temperature 102.2 F (39 C), temperature source Oral, resp. rate 24, height 5\' 6"  (1.676 m), weight 97.523 kg (215 lb), SpO2 99 %.  PHYSICAL EXAMINATION:  GENERAL:  65 y.o.-year-old patient lying in the bed with mild acute distress.  EYES: Pupils equal, round, reactive to light and accommodation. No scleral icterus. Extraocular muscles intact.  HEENT: Head atraumatic, normocephalic. Oropharynx and nasopharynx clear.  NECK:  Supple, no jugular venous distention. No thyroid enlargement, no tenderness.  LUNGS: Normal breath sounds bilaterally, no wheezing, rales,rhonchi or crepitation. No use of accessory muscles of respiration.  CARDIOVASCULAR: S1, S2 normal. No murmurs, rubs, or gallops. Tachycardia  ABDOMEN: Soft, nontender, nondistended. Bowel sounds present. No organomegaly or mass.  EXTREMITIES:3+  pedal edema,  no cyanosis, or clubbing. Bilateral lower extremity venous stasis changes. There is purplish hue in the right lower extremity. No elevated temperature no tenderness.  NEUROLOGIC: Cranial nerves II through XII are intact. Muscle strength 5/5 in all extremities. Sensation intact. Gait not checked.  PSYCHIATRIC: The patient is alert and oriented x 3.  SKIN: No obvious rash, lesion, or ulcer.   LABORATORY PANEL:   CBC  Recent Labs Lab 11/29/15 0937  WBC 16.8*  HGB 13.5  HCT 41.0  PLT 140*   ------------------------------------------------------------------------------------------------------------------  Chemistries   Recent Labs Lab 11/29/15 0937  NA 144  K 3.2*  CL 104  CO2 23  GLUCOSE 124*  BUN 40*  CREATININE 2.01*  CALCIUM 9.2  AST  80*  ALT 24  ALKPHOS 64  BILITOT 1.1   ------------------------------------------------------------------------------------------------------------------  Cardiac Enzymes  Recent Labs Lab 11/29/15 0937  TROPONINI 5.24*   ------------------------------------------------------------------------------------------------------------------  RADIOLOGY:  US Venous Img Lower Unilateral Right  11/29/2015  CLINICAL DATA:  Right lower extremity redness and tenderness for 1 month. No known injury. Initial encounter. EXAM: RIGHT LOWER EXTREMITY VENOUS DOPPLER ULTRASOUND TECHNIQUE: Gray-scale sonography with graded compression, as well as color Doppler and duplex ultrasound were performed to evaluate the lower extremity deep venous systems from the level of the common femoral vein and including the common femoral, femoral, profunda femoral, popliteal and calf veins including the posterior tibial, peroneal and gastrocnemius veins when visible. The superficial great saphenous vein was also interrogated. Spectral Doppler was utilized to evaluate flow at rest and with distal augmentation maneuvers in the common femoral, femoral and popliteal veins. COMPARISON:  None. FINDINGS: Contralateral Common Femoral Vein: Respiratory phasicity is normal and symmetric with the symptomatic side. No evidence of thrombus. Normal compressibility. Common Femoral Vein: No evidence of thrombus. Normal compressibility, respiratory phasicity and response to augmentation. Saphenofemoral Junction: No evidence of thrombus.  Normal compressibility and flow on color Doppler imaging. Profunda Femoral Vein: No evidence of thrombus. Normal compressibility and flow on color Doppler imaging. Femoral Vein: No evidence of thrombus. Normal compressibility, respiratory phasicity and response to augmentation. Popliteal Vein: No evidence of thrombus. Normal compressibility, respiratory phasicity and response to augmentation. Calf Veins: No evidence of  thrombus. Normal compressibility and flow on color Doppler imaging. Superficial Great Saphenous Vein: No evidence of thrombus. Normal compressibility and flow on color Doppler imaging. Venous Reflux:  None. Other Findings:  None. IMPRESSION: No evidence of deep venous thrombosis. Electronically Signed   By: Inge Rise M.D.   On: 11/29/2015 10:44   Dg Chest Port 1 View  11/29/2015  CLINICAL DATA:  Dizziness, tachycardia for 2 days, right leg swelling EXAM: PORTABLE CHEST 1 VIEW COMPARISON:  08/24/2014 FINDINGS: Borderline cardiomegaly. No acute infiltrate or pleural effusion. No pulmonary edema. Mild elevation of the right hemidiaphragm. Mild degenerative changes thoracic spine. IMPRESSION: No active disease.  Borderline cardiomegaly. Electronically Signed   By: Lahoma Crocker M.D.   On: 11/29/2015 10:50    EKG:   Sinus tachycardia IMPRESSION AND PLAN:   Patricia Ewing  is a 65 y.o. female with a known history of hypertension, obesity, tobacco abuse, asthma comes to the emergency room with complaints of feeling dizziness and weak. Patient also had fever and leg swelling. She has right lower extremity redness of the leg for last 1 month. She was found to be in A. fib with RVR heart rate was in the 180s. She was started on  amiodarone drip after bolus was given. She is now converted to sinus tachycardia.  1. Sepsis secondary to UTI -Came in with elevated white count, fever of 103, tachycardia, abnormal UA and elevated lactic acid -IV vancomycin and Zosyn. De-escalate antibiotics once cultures obtained. -Follow blood culture urine culture -IV fluids for hydration -Lactic acid trending down  2. Rapid A. fib with RVR, new onset -Seen by Dr. Humphrey Rolls -Recommends IV amiodarone drip -IV heparin drip -By mouth diltiazem if needed  3. Acute  NSTEMI -Troponin 5.2 -Cycle cardiac enzymes 3 -Aspirin, nitroglycerin, IV heparin drip, consider low-dose beta blockers, statins -Further recommendations per  cardiology  4. Acute renal failure secondary to ATN from sepsis and A. Fib -Nephrology consultation -IV fluids, monitor I's and O's, avoid nephrotoxins -Baseline creatinine 1.09  5. Glaucoma continue eyedrops  6. History of asthma appears stable continue oxygen, breathing treatments as needed  7. Tobacco abuse Smoking cessation advised. About 40 minutes spent.  8. DVT prophylaxis already on IV heparin drip   All the records are reviewed and case discussed with ED provider. Management plans discussed with the patient, family and they are in agreement.  CODE STATUS: Full TOTAL critical TIME TAKING CARE OF THIS PATIENT: 50 minutes.    Patricia Ewing M.D on 11/29/2015 at 2:28 PM  Between 7am to 6pm - Pager - [phone]  After 6pm go to [email or web address] - password [email or web address] [facility]  Office  [phone]  CC: Primary care physician; Madelyn Brunner, MD

## 2015-11-29 NOTE — ED Notes (Signed)
Pt converted to sinus tach 120s after amiodorone bolus, drips continues, Dr Humphrey Rolls in to see patient.

## 2015-11-29 NOTE — Progress Notes (Signed)
ELECTROLYTE CONSULT NOTE - INITIAL   Pharmacy Consult for electrolyte monitoring and replacement Indication: hypokalemia  Allergies  Allergen Reactions  . Contrast Media [Iodinated Diagnostic Agents] Other (See Comments)    Flushed, cough, anxiety, mild less responsiveness.   Patient Measurements: Height: 5\' 6"  (167.6 cm) Weight: 219 lb 4.8 oz (99.474 kg) IBW/kg (Calculated) : 59.3  Vital Signs: Temp: 99.5 F (37.5 C) (05/01 2006) Temp Source: Oral (05/01 2006) BP: 148/89 mmHg (05/01 2006) Pulse Rate: 101 (05/01 2006) Intake/Output from previous day:   Intake/Output from this shift:    Estimated Creatinine Clearance: 33.2 mL/min (by C-G formula based on Cr of 2.01).  Medical History: Past Medical History  Diagnosis Date  . Hypertension   . Diabetes mellitus     diagnosed at age 28, on a "pill" (?metformin), but now diet-controlled  . Glaucoma   . Hyperlipidemia     managed with lifestyle modification  . Tobacco abuse     No PFT's or diagnosis of COPD   Assessment: Pharmacy consulted to monitor and replace electrolytes if needed in this 65 year old female admitted with sepsis and new onset atrial fibrillation. Potassium of 3.2 is low on admission. Have ordered an add-on Mg. Patient was taking potassium chloride 20 mEq daily prior to admission which will be resumed.   Goal of Therapy:  Electrolytes within normal limits  Plan:  Order KCl 20 mEq PO every 2 hours for a total of 2 doses.  Will check Mg/K/Phos with AM labs tomorrow. Thank you for allowing pharmacy to be part of this patient's care.   5/1:  Mag @ 19:46 = 2.0.   No additional supplementation needed.  Will recheck electrolytes on 5/2 with AM labs.   Orene Desanctis, PharmD Clinical Pharmacist 11/29/2015,8:34 PM

## 2015-11-29 NOTE — Progress Notes (Signed)
Patricia Ewing is a 65 y.o. female  [id number]  Primary Cardiologist: Neoma Laming Reason for Consultation: Atrial fibrillation and elevated troponin  HPI: This is a 65 year old African-American female presented to the hospital with dizziness and palpitation fever and leg swelling. Patient was found to have atrial fibrillation with rapid ventricular response rate and was started on IV amiodarone and now has converted to sinus tachycardia.   Review of Systems: No chest pain F2   Past Medical History  Diagnosis Date  . Hypertension   . Diabetes mellitus     diagnosed at age 32, on a "pill" (?metformin), but now diet-controlled  . Glaucoma   . Hyperlipidemia     managed with lifestyle modification  . Tobacco abuse     No PFT's or diagnosis of COPD     (Not in a hospital admission)   . calcium gluconate      . heparin        Infusions: . heparin 1,100 Units/hr (11/29/15 1317)  . piperacillin-tazobactam (ZOSYN)  IV    . vancomycin      Allergies  Allergen Reactions  . Contrast Media [Iodinated Diagnostic Agents] Other (See Comments)    Flushed, cough, anxiety, mild less responsiveness.    Social History   Social History  . Marital Status: Married    Spouse Name: N/A  . Number of Children: N/A  . Years of Education: N/A   Occupational History  . Not on file.   Social History Main Topics  . Smoking status: Current Every Day Smoker -- 0.50 packs/day for 40 years    Types: Cigarettes  . Smokeless tobacco: Not on file  . Alcohol Use: 0.5 - 1.0 oz/week    1-2 drink(s) per week  . Drug Use: No  . Sexual Activity: Not on file   Other Topics Concern  . Not on file   Social History Narrative   Works as a Tourist information centre manager with [employer]  Has insurance.    Family History  Problem Relation Age of Onset  . Heart attack Father 37  . Lymphoma Son   . Colon cancer Other     PHYSICAL EXAM: Filed Vitals:   11/29/15 1241 11/29/15 1256  BP: 143/113 174/106   Pulse: 195 122  Temp:    Resp: 24 24     Intake/Output Summary (Last 24 hours) at 11/29/15 1333 Last data filed at 11/29/15 1150  Gross per 24 hour  Intake      0 ml  Output    600 ml  Net   -600 ml    General:  Well appearing. No respiratory difficulty HEENT: normal Neck: supple. no JVD. Carotids 2+ bilat; no bruits. No lymphadenopathy or thryomegaly appreciated. Cor: PMI nondisplaced. Regular rate & rhythm. No rubs, gallops or murmurs. Lungs: clear Abdomen: soft, nontender, nondistended. No hepatosplenomegaly. No bruits or masses. Good bowel sounds. Extremities: no cyanosis, clubbing, rash, edema Neuro: alert & oriented x 3, cranial nerves grossly intact. moves all 4 extremities w/o difficulty. Affect pleasant.  ECG: Atrial fibrillation with rapid ventricular response rate and nonspecific ST-T changes.  Results for orders placed or performed during the hospital encounter of 11/29/15 (from the past 24 hour(s))  Comprehensive metabolic panel     Status: Abnormal   Collection Time: 11/29/15  9:37 AM  Result Value Ref Range   Sodium 144 135 - 145 mmol/L   Potassium 3.2 (L) 3.5 - 5.1 mmol/L   Chloride 104 101 - 111 mmol/L  CO2 23 22 - 32 mmol/L   Glucose, Bld 124 (H) 65 - 99 mg/dL   BUN 40 (H) 6 - 20 mg/dL   Creatinine, Ser 2.01 (H) 0.44 - 1.00 mg/dL   Calcium 9.2 8.9 - 10.3 mg/dL   Total Protein 8.1 6.5 - 8.1 g/dL   Albumin 3.8 3.5 - 5.0 g/dL   AST 80 (H) 15 - 41 U/L   ALT 24 14 - 54 U/L   Alkaline Phosphatase 64 38 - 126 U/L   Total Bilirubin 1.1 0.3 - 1.2 mg/dL   GFR calc non Af Amer 25 (L) >60 mL/min   GFR calc Af Amer 29 (L) >60 mL/min   Anion gap 17 (H) 5 - 15  Troponin I     Status: Abnormal   Collection Time: 11/29/15  9:37 AM  Result Value Ref Range   Troponin I 5.24 (H) <0.031 ng/mL  CBC with Differential     Status: Abnormal   Collection Time: 11/29/15  9:37 AM  Result Value Ref Range   WBC 16.8 (H) 3.6 - 11.0 K/uL   RBC 4.55 3.80 - 5.20 MIL/uL    Hemoglobin 13.5 12.0 - 16.0 g/dL   HCT 41.0 35.0 - 47.0 %   MCV 90.0 80.0 - 100.0 fL   MCH 29.7 26.0 - 34.0 pg   MCHC 33.0 32.0 - 36.0 g/dL   RDW 15.2 (H) 11.5 - 14.5 %   Platelets 140 (L) 150 - 440 K/uL   Neutrophils Relative % 97 %   Lymphocytes Relative 1 %   Monocytes Relative 2 %   Eosinophils Relative 0 %   Basophils Relative 0 %   Neutro Abs 16.3 (H) 1.4 - 6.5 K/uL   Lymphs Abs 0.2 (L) 1.0 - 3.6 K/uL   Monocytes Absolute 0.3 0.2 - 0.9 K/uL   Eosinophils Absolute 0.0 0 - 0.7 K/uL   Basophils Absolute 0.0 0 - 0.1 K/uL  Lipase, blood     Status: None   Collection Time: 11/29/15  9:37 AM  Result Value Ref Range   Lipase 12 11 - 51 U/L  APTT     Status: None   Collection Time: 11/29/15  9:37 AM  Result Value Ref Range   aPTT 33 24 - 36 seconds  Protime-INR     Status: Abnormal   Collection Time: 11/29/15  9:37 AM  Result Value Ref Range   Prothrombin Time 17.9 (H) 11.4 - 15.0 seconds   INR 1.47   TSH     Status: Abnormal   Collection Time: 11/29/15  9:37 AM  Result Value Ref Range   TSH 0.284 (L) 0.350 - 4.500 uIU/mL  Lactic acid, plasma     Status: Abnormal   Collection Time: 11/29/15  9:44 AM  Result Value Ref Range   Lactic Acid, Venous 2.6 (HH) 0.5 - 2.0 mmol/L  Urinalysis complete, with microscopic     Status: Abnormal   Collection Time: 11/29/15 10:00 AM  Result Value Ref Range   Color, Urine YELLOW (A) YELLOW   APPearance HAZY (A) CLEAR   Glucose, UA NEGATIVE NEGATIVE mg/dL   Bilirubin Urine NEGATIVE NEGATIVE   Ketones, ur TRACE (A) NEGATIVE mg/dL   Specific Gravity, Urine 1.014 1.005 - 1.030   Hgb urine dipstick 3+ (A) NEGATIVE   pH 5.0 5.0 - 8.0   Protein, ur 100 (A) NEGATIVE mg/dL   Nitrite POSITIVE (A) NEGATIVE   Leukocytes, UA TRACE (A) NEGATIVE   RBC / HPF 0-5 0 -  5 RBC/hpf   WBC, UA 6-30 0 - 5 WBC/hpf   Bacteria, UA MANY (A) NONE SEEN   Squamous Epithelial / LPF NONE SEEN NONE SEEN   Mucous PRESENT    US Venous Img Lower Unilateral  Right  11/29/2015  CLINICAL DATA:  Right lower extremity redness and tenderness for 1 month. No known injury. Initial encounter. EXAM: RIGHT LOWER EXTREMITY VENOUS DOPPLER ULTRASOUND TECHNIQUE: Gray-scale sonography with graded compression, as well as color Doppler and duplex ultrasound were performed to evaluate the lower extremity deep venous systems from the level of the common femoral vein and including the common femoral, femoral, profunda femoral, popliteal and calf veins including the posterior tibial, peroneal and gastrocnemius veins when visible. The superficial great saphenous vein was also interrogated. Spectral Doppler was utilized to evaluate flow at rest and with distal augmentation maneuvers in the common femoral, femoral and popliteal veins. COMPARISON:  None. FINDINGS: Contralateral Common Femoral Vein: Respiratory phasicity is normal and symmetric with the symptomatic side. No evidence of thrombus. Normal compressibility. Common Femoral Vein: No evidence of thrombus. Normal compressibility, respiratory phasicity and response to augmentation. Saphenofemoral Junction: No evidence of thrombus. Normal compressibility and flow on color Doppler imaging. Profunda Femoral Vein: No evidence of thrombus. Normal compressibility and flow on color Doppler imaging. Femoral Vein: No evidence of thrombus. Normal compressibility, respiratory phasicity and response to augmentation. Popliteal Vein: No evidence of thrombus. Normal compressibility, respiratory phasicity and response to augmentation. Calf Veins: No evidence of thrombus. Normal compressibility and flow on color Doppler imaging. Superficial Great Saphenous Vein: No evidence of thrombus. Normal compressibility and flow on color Doppler imaging. Venous Reflux:  None. Other Findings:  None. IMPRESSION: No evidence of deep venous thrombosis. Electronically Signed   By: Inge Rise M.D.   On: 11/29/2015 10:44   Dg Chest Port 1 View  11/29/2015  CLINICAL  DATA:  Dizziness, tachycardia for 2 days, right leg swelling EXAM: PORTABLE CHEST 1 VIEW COMPARISON:  08/24/2014 FINDINGS: Borderline cardiomegaly. No acute infiltrate or pleural effusion. No pulmonary edema. Mild elevation of the right hemidiaphragm. Mild degenerative changes thoracic spine. IMPRESSION: No active disease.  Borderline cardiomegaly. Electronically Signed   By: Lahoma Crocker M.D.   On: 11/29/2015 10:50     ASSESSMENT AND PLAN: Atrial fibrillation with rapid ventricular response rate. Patient has come converted to sinus tachycardia with IV amiodarone. I advise giving heparin as anticoagulation for atrial fibrillation as well as elevated troponin. Patient may have urinary tract infection or cellulitis which may have led to stress induced demand ischemia causing elevated troponin. Will get echocardiogram to look at wall motion. Advise continuation of amiodarone and heparin and aspirin.  Sway Guttierrez A

## 2015-11-30 ENCOUNTER — Inpatient Hospital Stay: Payer: Medicare Other

## 2015-11-30 ENCOUNTER — Inpatient Hospital Stay
Admit: 2015-11-30 | Discharge: 2015-11-30 | Disposition: A | Discharge status: Home / Self Care | Payer: Medicare Other | Attending: Cardiovascular Disease | Admitting: Cardiovascular Disease

## 2015-11-30 LAB — CBC WITH DIFFERENTIAL/PLATELET
BASOS ABS: 0 10*3/uL (ref 0–0.1)
BASOS PCT: 0 %
EOS ABS: 0 10*3/uL (ref 0–0.7)
Eosinophils Relative: 0 %
HEMATOCRIT: 34.5 % — AB (ref 35.0–47.0)
HEMOGLOBIN: 11.3 g/dL — AB (ref 12.0–16.0)
Lymphocytes Relative: 3 %
Lymphs Abs: 0.4 10*3/uL — ABNORMAL LOW (ref 1.0–3.6)
MCH: 30.2 pg (ref 26.0–34.0)
MCHC: 32.8 g/dL (ref 32.0–36.0)
MCV: 92.1 fL (ref 80.0–100.0)
Monocytes Absolute: 0.4 10*3/uL (ref 0.2–0.9)
Monocytes Relative: 3 %
Neutro Abs: 11 10*3/uL — ABNORMAL HIGH (ref 1.4–6.5)
Neutrophils Relative %: 94 %
Platelets: 117 10*3/uL — ABNORMAL LOW (ref 150–440)
RBC: 3.75 MIL/uL — ABNORMAL LOW (ref 3.80–5.20)
RDW: 15.2 % — ABNORMAL HIGH (ref 11.5–14.5)
WBC: 11.8 10*3/uL — ABNORMAL HIGH (ref 3.6–11.0)

## 2015-11-30 LAB — PHOSPHORUS: PHOSPHORUS: 3.4 mg/dL (ref 2.5–4.6)

## 2015-11-30 LAB — BASIC METABOLIC PANEL
Anion gap: 9 (ref 5–15)
BUN: 40 mg/dL — ABNORMAL HIGH (ref 6–20)
CHLORIDE: 108 mmol/L (ref 101–111)
CO2: 24 mmol/L (ref 22–32)
Calcium: 8.4 mg/dL — ABNORMAL LOW (ref 8.9–10.3)
Creatinine, Ser: 1.86 mg/dL — ABNORMAL HIGH (ref 0.44–1.00)
GFR calc non Af Amer: 27 mL/min — ABNORMAL LOW (ref >60)
GFR, EST AFRICAN AMERICAN: 32 mL/min — AB (ref >60)
Glucose, Bld: 135 mg/dL — ABNORMAL HIGH (ref 65–99)
POTASSIUM: 3.4 mmol/L — AB (ref 3.5–5.1)
SODIUM: 141 mmol/L (ref 135–145)

## 2015-11-30 LAB — ECHOCARDIOGRAM COMPLETE
HEIGHTINCHES: 66 in
Weight: 3508.8 oz

## 2015-11-30 LAB — PROTEIN / CREATININE RATIO, URINE
CREATININE, URINE: 31 mg/dL
PROTEIN CREATININE RATIO: 1.45 mg/mg{creat} — AB (ref 0.00–0.15)
TOTAL PROTEIN, URINE: 45 mg/dL

## 2015-11-30 LAB — TROPONIN I
TROPONIN I: 4.98 ng/mL — AB (ref <0.031)
Troponin I: 6.01 ng/mL — ABNORMAL HIGH (ref <0.031)

## 2015-11-30 LAB — HEPARIN LEVEL (UNFRACTIONATED): HEPARIN UNFRACTIONATED: 0.34 [IU]/mL (ref 0.30–0.70)

## 2015-11-30 LAB — MAGNESIUM: MAGNESIUM: 2 mg/dL (ref 1.7–2.4)

## 2015-11-30 LAB — T4, FREE: FREE T4: 1.09 ng/dL (ref 0.61–1.12)

## 2015-11-30 IMAGING — DX DG CHEST 1V
1 series · 1 of 1 positions shown · non-contrast
Comparison: [DATE].

CLINICAL DATA: Shortness of breath.

EXAM:
CHEST 1 VIEW

[chest ap]
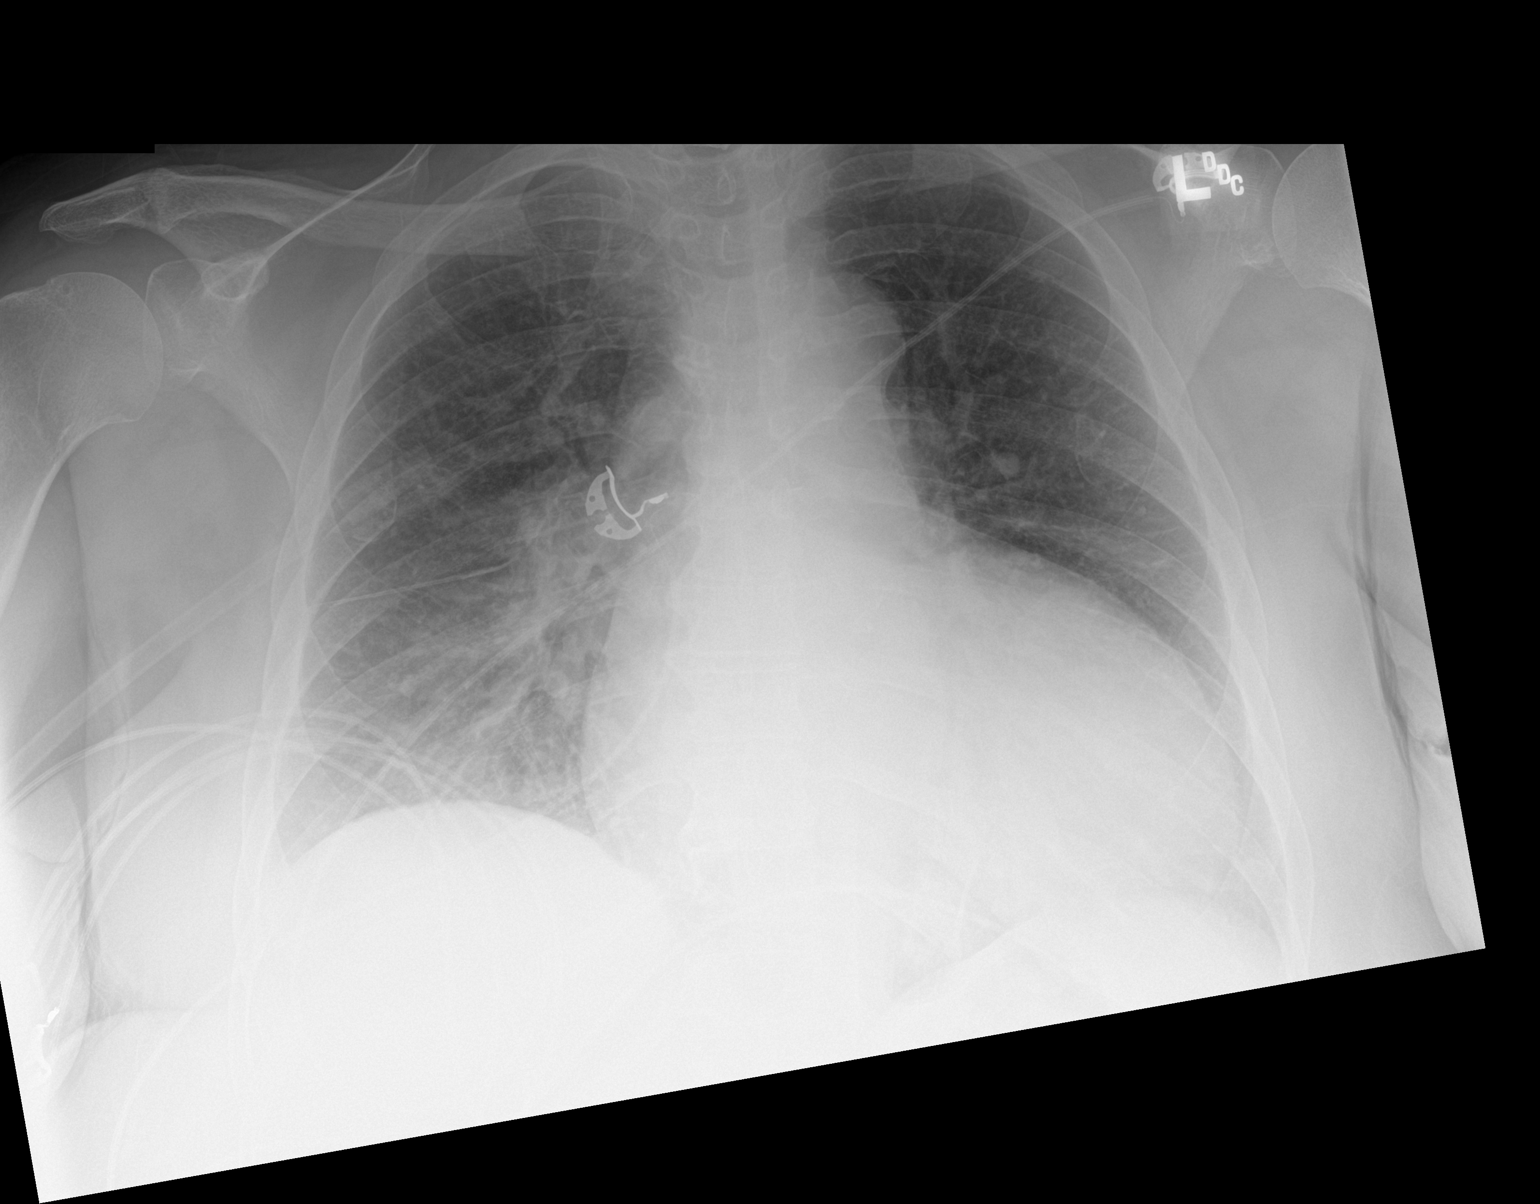

[1 of 1 positions shown; findings below may reference images not displayed]

FINDINGS: Stable cardiomegaly. No pneumothorax or pleural effusion is noted.
Left lung is clear. New mild right infrahilar opacity is noted
concerning for edema or inflammation. Bony thorax is unremarkable.
IMPRESSION: New mild right infrahilar opacity is noted concerning for edema or
inflammation.

## 2015-11-30 MED ORDER — IPRATROPIUM-ALBUTEROL 0.5-2.5 (3) MG/3ML IN SOLN
3.0000 mL | RESPIRATORY_TRACT | Status: DC
Start: 2015-11-30 — End: 2015-12-04
  Administered 2015-11-30 – 2015-12-04 (×25): 3 mL via RESPIRATORY_TRACT
  Filled 2015-11-30 (×23): qty 3

## 2015-11-30 MED ORDER — POTASSIUM CHLORIDE CRYS ER 20 MEQ PO TBCR
40.0000 meq | EXTENDED_RELEASE_TABLET | Freq: Once | ORAL | Status: AC
  Administered 2015-11-30: 40 meq via ORAL
  Filled 2015-11-30: qty 2

## 2015-11-30 MED ORDER — FUROSEMIDE 10 MG/ML IJ SOLN
40.0000 mg | Freq: Two times a day (BID) | INTRAMUSCULAR | Status: DC
  Administered 2015-11-30: 40 mg via INTRAVENOUS

## 2015-11-30 MED ORDER — POTASSIUM CHLORIDE CRYS ER 20 MEQ PO TBCR
20.0000 meq | EXTENDED_RELEASE_TABLET | Freq: Every day | ORAL | Status: DC
  Administered 2015-12-01: 20 meq via ORAL
  Filled 2015-11-30: qty 1

## 2015-11-30 MED ORDER — FUROSEMIDE 10 MG/ML IJ SOLN
40.0000 mg | Freq: Two times a day (BID) | INTRAMUSCULAR | Status: DC
  Filled 2015-11-30: qty 4

## 2015-11-30 MED ORDER — AMIODARONE HCL 200 MG PO TABS
200.0000 mg | ORAL_TABLET | Freq: Every day | ORAL | Status: DC
  Administered 2015-11-30 – 2015-12-06 (×7): 200 mg via ORAL
  Filled 2015-11-30 (×7): qty 1

## 2015-11-30 MED ORDER — SODIUM CHLORIDE 0.9 % IV SOLN: INTRAVENOUS | Status: DC

## 2015-11-30 MED ORDER — METHYLPREDNISOLONE SODIUM SUCC 40 MG IJ SOLR
40.0000 mg | Freq: Three times a day (TID) | INTRAMUSCULAR | Status: DC
  Administered 2015-11-30 – 2015-12-02 (×6): 40 mg via INTRAVENOUS
  Filled 2015-11-30 (×6): qty 1

## 2015-11-30 NOTE — Progress Notes (Signed)
Pt feels like she is not able to empty her bladder.  Bladder scanned pt, no urine noted in bladder.  Will continue to monitor.

## 2015-11-30 NOTE — Progress Notes (Signed)
After discussion with Dr. Posey Pronto, we will hold off on NS for now, and monitor renal function.

## 2015-11-30 NOTE — Progress Notes (Signed)
ELECTROLYTE CONSULT NOTE - Follow Up  Pharmacy Consult for electrolyte monitoring and replacement Indication: hypokalemia  Allergies  Allergen Reactions  . Contrast Media [Iodinated Diagnostic Agents] Other (See Comments)    Flushed, cough, anxiety, mild less responsiveness.   Patient Measurements: Height: 5\' 6"  (167.6 cm) Weight: 219 lb 4.8 oz (99.474 kg) IBW/kg (Calculated) : 59.3  Vital Signs: Temp: 99.3 F (37.4 C) (05/02 0728) Temp Source: Oral (05/02 0728) BP: 149/84 mmHg (05/02 0740) Pulse Rate: 66 (05/02 0740) Intake/Output from previous day: 05/01 0701 - 05/02 0700 In: [id number] [P.O.:240; I.V.:3000] Out: 1150 [Urine:1150] Intake/Output from this shift: Total I/O In: -  Out: 200 [Urine:200]  Estimated Creatinine Clearance: 35.9 mL/min (by C-G formula based on Cr of 1.86).  Medical History: Past Medical History  Diagnosis Date  . Hypertension   . Diabetes mellitus     diagnosed at age 29, on a "pill" (?metformin), but now diet-controlled  . Glaucoma   . Hyperlipidemia     managed with lifestyle modification  . Tobacco abuse     No PFT's or diagnosis of COPD   Assessment: Pharmacy consulted to monitor and replace electrolytes if needed in this 65 year old female admitted with sepsis and new onset atrial fibrillation. Potassium of 3.2 was low on admission. Have ordered an add-on Mg. Patient was taking potassium chloride 20 mEq daily prior to admission which will be resumed.   Orders for KCl 20 meq po q2h x 2 doses on 5/1.  Per MAR, only one dose was given.   K: 3.4, Mag: 2.0  Goal of Therapy:  Electrolytes within normal limits  Plan:  Will order KCl 40 meq po once and start KCl 20 meq po daily (home dosing) on 5/3.  No magnesium supplementation warranted.  Will check labs in AM.    Loleta Dicker, PharmD Clinical Pharmacist 11/30/2015,8:51 AM

## 2015-11-30 NOTE — Progress Notes (Signed)
Amiodarone gtt stopper per md orders, Amiodarone 200mg  po given

## 2015-11-30 NOTE — Progress Notes (Signed)
ANTICOAGULATION CONSULT NOTE - Initial Consult  Pharmacy Consult for heparin drip Indication: atrial fibrillation  Allergies  Allergen Reactions  . Contrast Media [Iodinated Diagnostic Agents] Other (See Comments)    Flushed, cough, anxiety, mild less responsiveness.   Patient Measurements: Height: 5\' 6"  (167.6 cm) Weight: 219 lb 4.8 oz (99.474 kg) IBW/kg (Calculated) : 59.3 Heparin Dosing Weight: 81.1 kg  Vital Signs: Temp: 101.2 F (38.4 C) (05/02 0007) Temp Source: Oral (05/02 0007) BP: 125/63 mmHg (05/02 0007) Pulse Rate: 88 (05/02 0007)  Labs:  Recent Labs  11/29/15 0937 11/29/15 1935 11/29/15 1946 11/29/15 2313 11/30/15 0200  HGB 13.5  --   --   --   --   HCT 41.0  --   --   --   --   PLT 140*  --   --   --   --   APTT 33  --   --   --   --   LABPROT 17.9*  --   --   --   --   INR 1.47  --   --   --   --   HEPARINUNFRC  --   --  0.43  --  0.34  CREATININE 2.01*  --   --   --   --   TROPONINI 5.24* 6.47*  --  6.01*  --    Estimated Creatinine Clearance: 33.2 mL/min (by C-G formula based on Cr of 2.01).  Medical History: Past Medical History  Diagnosis Date  . Hypertension   . Diabetes mellitus     diagnosed at age 65, on a "pill" (?metformin), but now diet-controlled  . Glaucoma   . Hyperlipidemia     managed with lifestyle modification  . Tobacco abuse     No PFT's or diagnosis of COPD   Assessment: Pharmacy consulted to dose and monitor heparin drip in this 65 year old female for new onset atrial fibrillation. Patient was not taking any anticoagulants prior to admission per med rec. Baseline labs were obtained in the ED.  Hgb 13.5 Plt 140  INR 1.47 Troponin 5.24  APTT 33  Goal of Therapy:  Heparin level 0.3-0.7 units/ml Monitor platelets by anticoagulation protocol: Yes   Plan:  Give 4050 units bolus x 1 (~50 units/kg) Start heparin infusion at 1100 units/hr (~14 units/kg/hr) Check anti-Xa level in 6 hours and daily while on heparin Continue  to monitor H&H and platelets  5/01:  HL @ 19:46 = 0.43.  Will continue this pt on current rate of 1100 units/hr and recheck HL on 5/2 @ 2:00.   5/2 02:00 heparin level 0.34. Continue current regimen. Recheck heparin level and CBC with tomorrow AM labs.  Thank you for the consult.  Eloise Harman, PharmD Clinical Pharmacist 11/30/2015,2:46 AM

## 2015-11-30 NOTE — Clinical Social Work Note (Signed)
Clinical Social Work Assessment  Patient Details  Name: Patricia Ewing MRN: [id number] Date of Birth: 14-Sep-1950  Date of referral:  11/30/15               Reason for consult:  Facility Placement                Permission sought to share information with:  Family Supports Permission granted to share information::     Name::        Agency::     Relationship::     Contact Information:     Housing/Transportation Living arrangements for the past 2 months:  Single Family Home Source of Information:  Patient, Spouse Patient Interpreter Needed:  None Criminal Activity/Legal Involvement Pertinent to Current Situation/Hospitalization:  No - Comment as needed Significant Relationships:    Lives with:  Spouse Do you feel safe going back to the place where you live?  Yes Need for family participation in patient care:  No (Coment)  Care giving concerns:  Patient resides at home with her husband.   Social Worker assessment / plan:  CSW consulted for discharge planning for patient. CSW met with patient and her husband this morning. PT consult is pending. Patient reports that she has been walking independently to and from the bathroom in her hospital room. Patient and husband report no concerns or issues at this time. CSW not anticipating rehab recommendation from PT as patient has been ambulating in her room independently but CSW will continue to follow. Patient stated she would consider rehab if needed but rather return home.  Employment status:  Retired Nurse, adult PT Recommendations:  Not assessed at this time Information / Referral to community resources:     Patient/Family's Response to care:  Patient and husband expressed appreciation for CSW visit.  Patient/Family's Understanding of and Emotional Response to Diagnosis, Current Treatment, and Prognosis:  Patient noticeably in discomfort but was very pleasant and engaged in conversation.  Emotional  Assessment Appearance:  Appears stated age Attitude/Demeanor/Rapport:   (pleasant and cooperative) Affect (typically observed):  Accepting, Adaptable, Calm Orientation:  Oriented to Self, Oriented to Place, Oriented to  Time, Oriented to Situation Alcohol / Substance use:  Not Applicable Psych involvement (Current and /or in the community):  No (Comment)  Discharge Needs  Concerns to be addressed:  Care Coordination Readmission within the last 30 days:  No Current discharge risk:  None Barriers to Discharge:  No Barriers Identified   Shela Leff, LCSW 11/30/2015, 11:13 AM

## 2015-11-30 NOTE — Progress Notes (Signed)
Dr. Benjie Karvonen to put some orders in for pt.  Will continue to monitor.

## 2015-11-30 NOTE — Progress Notes (Signed)
SUBJECTIVE: Patient is feeling much better   Filed Vitals:   11/30/15 0007 11/30/15 0413 11/30/15 0728 11/30/15 0740  BP: 125/63 142/77 149/84 149/84  Pulse: 88 93 93 66  Temp: 101.2 F (38.4 C) 99.1 F (37.3 C) 99.3 F (37.4 C)   TempSrc: Oral Oral Oral   Resp: 18 18 18    Height:      Weight:      SpO2: 96% 94% 95% 87%    Intake/Output Summary (Last 24 hours) at 11/30/15 0932 Last data filed at 11/30/15 0839  Gross per 24 hour  Intake   3240 ml  Output   1350 ml  Net   1890 ml    LABS: Basic Metabolic Panel:  Recent Labs  11/29/15 0937 11/29/15 1946 11/30/15 0538  NA 144  --  141  K 3.2*  --  3.4*  CL 104  --  108  CO2 23  --  24  GLUCOSE 124*  --  135*  BUN 40*  --  40*  CREATININE 2.01*  --  1.86*  CALCIUM 9.2  --  8.4*  MG  --  2.0 2.0  PHOS  --   --  3.4   Liver Function Tests:  Recent Labs  11/29/15 0937  AST 80*  ALT 24  ALKPHOS 64  BILITOT 1.1  PROT 8.1  ALBUMIN 3.8    Recent Labs  11/29/15 0937  LIPASE 12   CBC:  Recent Labs  11/29/15 0937 11/30/15 0538  WBC 16.8* 11.8*  NEUTROABS 16.3* 11.0*  HGB 13.5 11.3*  HCT 41.0 34.5*  MCV 90.0 92.1  PLT 140* 117*   Cardiac Enzymes:  Recent Labs  11/29/15 1935 11/29/15 2313 11/30/15 0538  TROPONINI 6.47* 6.01* 4.98*   BNP: Invalid input(s): POCBNP D-Dimer: No results for input(s): DDIMER in the last 72 hours. Hemoglobin A1C: No results for input(s): HGBA1C in the last 72 hours. Fasting Lipid Panel: No results for input(s): CHOL, HDL, LDLCALC, TRIG, CHOLHDL, LDLDIRECT in the last 72 hours. Thyroid Function Tests:  Recent Labs  11/29/15 0937  TSH 0.284*   Anemia Panel: No results for input(s): VITAMINB12, FOLATE, FERRITIN, TIBC, IRON, RETICCTPCT in the last 72 hours.   PHYSICAL EXAM General: Well developed, well nourished, in no acute distress HEENT:  Normocephalic and atramatic Neck:  No JVD.  Lungs: Clear bilaterally to auscultation and percussion. Heart: HRRR  . Normal S1 and S2 without gallops or murmurs.  Abdomen: Bowel sounds are positive, abdomen soft and non-tender  Msk:  Back normal, normal gait. Normal strength and tone for age. Extremities: No clubbing, cyanosis or edema.   Neuro: Alert and oriented X 3. Psych:  Good affect, responds appropriately  TELEMETRY: Sinus rhythm at 100 bpm  ASSESSMENT AND PLAN: Atrial fibrillation with rapid ventricular response rate responded very well to amiodarone. Will change amiodarone to 400 once a day. Elevated troponin with peak troponin 6 and severe cellulitis of the right leg and worsening creatinine and thus is not a candidate for catheterization.  Active Problems:   Sepsis ([place])    KHAN,SHAUKAT A, MD, [facility] 11/30/2015 9:32 AM     Good morning

## 2015-11-30 NOTE — Progress Notes (Signed)
MEDICATION RELATED CONSULT NOTE - Follow Up  Pharmacy Consult for monitoring/recommending change in therapy secondary to drug interactions with amiodarone Indication: New start amiodarone  Allergies  Allergen Reactions  . Contrast Media [Iodinated Diagnostic Agents] Other (See Comments)    Flushed, cough, anxiety, mild less responsiveness.   Patient Measurements: Height: 5\' 6"  (167.6 cm) Weight: 219 lb 4.8 oz (99.474 kg) IBW/kg (Calculated) : 59.3  Vital Signs: Temp: 99.3 F (37.4 C) (05/02 0728) Temp Source: Oral (05/02 0728) BP: 149/84 mmHg (05/02 0740) Pulse Rate: 66 (05/02 0740) Intake/Output from previous day: 05/01 0701 - 05/02 0700 In: [id number] [P.O.:240; I.V.:3000] Out: 1150 [Urine:1150] Intake/Output from this shift: Total I/O In: 300 [I.V.:250; IV Piggyback:50] Out: 200 [Urine:200]  Labs:  Recent Labs  11/29/15 0937 11/29/15 1946 11/30/15 0538  WBC 16.8*  --  11.8*  HGB 13.5  --  11.3*  HCT 41.0  --  34.5*  PLT 140*  --  117*  APTT 33  --   --   CREATININE 2.01*  --  1.86*  MG  --  2.0 2.0  PHOS  --   --  3.4  ALBUMIN 3.8  --   --   PROT 8.1  --   --   AST 80*  --   --   ALT 24  --   --   ALKPHOS 64  --   --   BILITOT 1.1  --   --    Estimated Creatinine Clearance: 35.9 mL/min (by C-G formula based on Cr of 1.86).   Microbiology: Recent Results (from the past 720 hour(s))  Blood Culture (routine x 2)     Status: None (Preliminary result)   Collection Time: 11/29/15  9:42 AM  Result Value Ref Range Status   Specimen Description BLOOD LEFT AC  Final   Special Requests   Final    BOTTLES DRAWN AEROBIC AND ANAEROBIC AER 4ML ANA 2ML   Culture NO GROWTH < 24 HOURS  Final   Report Status PENDING  Incomplete  Blood Culture (routine x 2)     Status: None (Preliminary result)   Collection Time: 11/29/15  9:44 AM  Result Value Ref Range Status   Specimen Description BLOOD RIGHT AC  Final   Special Requests   Final    BOTTLES DRAWN AEROBIC AND ANAEROBIC  ANA 4ML AER 2ML   Culture NO GROWTH < 24 HOURS  Final   Report Status PENDING  Incomplete  Urine culture     Status: Abnormal (Preliminary result)   Collection Time: 11/29/15 10:00 AM  Result Value Ref Range Status   Specimen Description URINE, RANDOM  Final   Special Requests NONE  Final   Culture (A)  Final    80,000 COLONIES/mL GRAM NEGATIVE RODS IDENTIFICATION AND SUSCEPTIBILITIES TO FOLLOW    Report Status PENDING  Incomplete    Medications:  Scheduled:  . amiodarone  200 mg Oral Daily  . aspirin  81 mg Oral Daily  . atorvastatin  40 mg Oral q1800  . brimonidine  1 drop Both Eyes BID  . latanoprost  1 drop Both Eyes QHS  . metoprolol tartrate  25 mg Oral BID  . piperacillin-tazobactam (ZOSYN)  IV  3.375 g Intravenous Q8H  . [START ON 12/01/2015] potassium chloride  20 mEq Oral Daily  . vancomycin  1,000 mg Intravenous Q24H   Infusions:  . sodium chloride 125 mL/hr at 11/30/15 0903  . heparin 1,100 Units/hr (11/30/15 0903)   PRN: acetaminophen **OR**  acetaminophen, albuterol, nitroGLYCERIN, ondansetron **OR** ondansetron (ZOFRAN) IV, oxyCODONE  Assessment: Pharmacy consulted to monitor medication list and make recommendations regarding dose adjustments secondary to drug-drug interactions with amiodarone.  1. Amiodarone and ondansetron - risk of prolonged QTc 2. Amiodarone and atorvastatin - risk of increased atorvastatin concentration  Plan:  Patient admitted to telemetry unit and is being monitored.  Recommend close monitoring of QTc. Ondansetron is ordered PRN for nausea and no doses have been charted at this time. Monitor LFTs and for myalgia with atorvastatin  Thank you for the consult.   Idelle Reimann G 11/30/2015,11:13 AM

## 2015-11-30 NOTE — Progress Notes (Signed)
To ultrasound via bed 

## 2015-11-30 NOTE — Progress Notes (Signed)
High Flow Oxygen in place, neb treatment given. Will monitor pt.

## 2015-11-30 NOTE — Progress Notes (Signed)
O2 increased to 4L, O2 sat 89-90%. Dr. Benjie Karvonen in to see pt

## 2015-11-30 NOTE — Progress Notes (Signed)
*  PRELIMINARY RESULTS* Echocardiogram 2D Echocardiogram has been performed.  Patricia Ewing 11/30/2015, 2:34 PM

## 2015-11-30 NOTE — Progress Notes (Signed)
Pt breathing is less labored, states she is feeling better.  Will continue to monitor.

## 2015-11-30 NOTE — Consult Note (Signed)
[facility] CONSULT NOTE    Date: 11/30/2015                  Patient Name:  Patricia Ewing  MRN: [id number]  DOB: May 23, 1951  Age / Sex: 65 y.o., female         PCP: Madelyn Brunner, MD                 Service Requesting Consult: Dr. Fritzi Mandes                 Reason for Consult: Acute renal failure/CKD stage III            History of Present Illness: Patient is a 65 y.o. female with a PMHx of hypertension, diabetes mellitus type 2, glaucoma, hyperlipidemia, history of tobacco abuse, who was admitted to [facility] on 11/29/2015 for evaluation of  Cellulitis of the right lower extremity as well as malaise.  We were asked to see her for evaluation management of acute renal failure in this setting.  The patient's baseline creatinine appears to be 1.2.  As above she is had increasingswelling in the right lower extremity as well as cellulitis within the right lower extremity.  She has numerous excoriations on both lower extremities which likely served as a portal of entry for bacteria.  She reports that for pain she was taking BC powder as well as Aleve which likely have contributed to her acute renal failure.  She was also on Lasix 40 mg by mouth twice a day at home. Urinalysis was positive for protein.   Medications: Outpatient medications: Prescriptions prior to admission  Medication Sig Dispense Refill Last Dose  . albuterol (PROVENTIL HFA;VENTOLIN HFA) 108 (90 BASE) MCG/ACT inhaler Inhale 1 puff into the lungs every 6 (six) hours as needed. Shortness of breath   prn at prn  . aspirin 81 MG tablet Take 81 mg by mouth daily.   11/28/2015 at Unknown time  . brimonidine (ALPHAGAN) 0.2 % ophthalmic solution Place 1 drop into both eyes 2 (two) times daily.   unknown at unknown  . cetirizine (ZYRTEC) 10 MG tablet Take 10 mg by mouth daily as needed. allergies   prn at prn  . cloNIDine (CATAPRES) 0.2 MG tablet Take 0.2 mg by mouth daily.   unknown at unknown  . furosemide  (LASIX) 40 MG tablet Take 40 mg by mouth 2 (two) times daily.  3 unknown at unknown   . latanoprost (XALATAN) 0.005 % ophthalmic solution Place 1 drop into both eyes.   unknown at unknown  . potassium chloride SA (K-DUR,KLOR-CON) 20 MEQ tablet Take 20 mEq by mouth daily.   unknown at unknown    Current medications: Current Facility-Administered Medications  Medication Dose Route Frequency Provider Last Rate Last Dose  . acetaminophen (TYLENOL) tablet 650 mg  650 mg Oral Q6H PRN Fritzi Mandes, MD   650 mg at 11/30/15 0041   Or  . acetaminophen (TYLENOL) suppository 650 mg  650 mg Rectal Q6H PRN Fritzi Mandes, MD      . albuterol (PROVENTIL) (2.5 MG/3ML) 0.083% nebulizer solution 2.5 mg  2.5 mg Nebulization Q6H PRN Lenis Noon, RPH      . amiodarone (PACERONE) tablet 200 mg  200 mg Oral Daily Dionisio David, MD   200 mg at 11/30/15 1016  . aspirin chewable tablet 81 mg  81 mg Oral Daily Lenis Noon, RPH   81 mg at 11/30/15 [id number]  .  atorvastatin (LIPITOR) tablet 40 mg  40 mg Oral q1800 Fritzi Mandes, MD   40 mg at 11/29/15 1838  . brimonidine (ALPHAGAN) 0.2 % ophthalmic solution 1 drop  1 drop Both Eyes BID Fritzi Mandes, MD   1 drop at 11/30/15 0905  . furosemide (LASIX) injection 40 mg  40 mg Intravenous BID Bettey Costa, MD   40 mg at 11/30/15 1204  . heparin ADULT infusion 100 units/mL (25000 units/250 mL)  1,100 Units/hr Intravenous Continuous Lenis Noon, [facility] 11 mL/hr at 11/30/15 0903 1,100 Units/hr at 11/30/15 0903  . ipratropium-albuterol (DUONEB) 0.5-2.5 (3) MG/3ML nebulizer solution 3 mL  3 mL Nebulization Q4H Sital Mody, MD   3 mL at 11/30/15 1510  . latanoprost (XALATAN) 0.005 % ophthalmic solution 1 drop  1 drop Both Eyes QHS Fritzi Mandes, MD   1 drop at 11/29/15 2329  . methylPREDNISolone sodium succinate (SOLU-MEDROL) 40 mg/mL injection 40 mg  40 mg Intravenous Q8H Sital Mody, MD   40 mg at 11/30/15 1314  . metoprolol tartrate (LOPRESSOR) tablet 25 mg  25 mg Oral BID Lenis Noon, RPH   25 mg  at 11/30/15 [id number]  . nitroGLYCERIN (NITROSTAT) SL tablet 0.4 mg  0.4 mg Sublingual Q5 min PRN Fritzi Mandes, MD      . ondansetron (ZOFRAN) tablet 4 mg  4 mg Oral Q6H PRN Fritzi Mandes, MD       Or  . ondansetron (ZOFRAN) injection 4 mg  4 mg Intravenous Q6H PRN Fritzi Mandes, MD      . oxyCODONE (Oxy IR/ROXICODONE) immediate release tablet 5 mg  5 mg Oral Q4H PRN Fritzi Mandes, MD      . piperacillin-tazobactam (ZOSYN) IVPB 3.375 g  3.375 g Intravenous [address] [place], RPH   3.375 g at 11/30/15 1314  . [START ON 12/01/2015] potassium chloride SA (K-DUR,KLOR-CON) CR tablet 20 mEq  20 mEq Oral Daily Crystal Jennefer Bravo, RPH          Allergies: Allergies  Allergen Reactions  . Contrast Media [Iodinated Diagnostic Agents] Other (See Comments)    Flushed, cough, anxiety, mild less responsiveness.      Past Medical History: Past Medical History  Diagnosis Date  . Hypertension   . Diabetes mellitus     diagnosed at age 71, on a "pill" (?metformin), but now diet-controlled  . Glaucoma   . Hyperlipidemia     managed with lifestyle modification  . Tobacco abuse     No PFT's or diagnosis of COPD     Past Surgical History: Past Surgical History  Procedure Laterality Date  . Abdominal hysterectomy  1984     Family History: Family History  Problem Relation Age of Onset  . Heart attack Father 97  . Lymphoma Son   . Colon cancer Other      Social History: Social History   Social History  . Marital Status: Married    Spouse Name: N/A  . Number of Children: N/A  . Years of Education: N/A   Occupational History  . Not on file.   Social History Main Topics  . Smoking status: Current Every Day Smoker -- 0.50 packs/day for 40 years    Types: Cigarettes  . Smokeless tobacco: Not on file  . Alcohol Use: 0.5 - 1.0 oz/week    1-2 drink(s) per week  . Drug Use: No  . Sexual Activity: Not on file   Other Topics Concern  . Not on file   Social History  Narrative   Works as a Designer, multimedia with [employer]  Has insurance.     Review of Systems: Review of Systems  Constitutional: Positive for fever and malaise/fatigue. Negative for chills and diaphoresis.  HENT: Negative for hearing loss and tinnitus.   Eyes: Negative for blurred vision, double vision and photophobia.  Respiratory: Negative for cough, hemoptysis and sputum production.   Cardiovascular: Positive for orthopnea and leg swelling. Negative for palpitations.  Gastrointestinal: Negative for heartburn, nausea and vomiting.  Genitourinary: Negative for dysuria and urgency.  Musculoskeletal: Negative for myalgias and neck pain.  Skin: Positive for rash.  Neurological: Negative for dizziness, speech change, seizures, weakness and headaches.  Endo/Heme/Allergies: Negative for polydipsia. Does not bruise/bleed easily.  Psychiatric/Behavioral: Negative for depression. The patient is not nervous/anxious.       Vital Signs: Blood pressure 173/86, pulse 102, temperature 99.6 F (37.6 C), temperature source Oral, resp. rate 21, height 5\' 6"  (1.676 m), weight 99.474 kg (219 lb 4.8 oz), SpO2 96 %.  Weight trends: Filed Weights   11/29/15 0935 11/29/15 1725  Weight: 97.523 kg (215 lb) 99.474 kg (219 lb 4.8 oz)    Physical Exam: General: NAD, resting in bed  Head: Normocephalic, atraumatic.  Eyes: Anicteric, EOMI  Nose: Mucous membranes moist, not inflammed, nonerythematous.  Throat: Oropharynx nonerythematous, no exudate appreciated.   Neck: Supple, trachea midline.  Lungs:  Normal respiratory effort. Clear to auscultation BL without crackles or wheezes.  Heart: RRR. S1 and S2 normal without gallop, murmur, or rubs.  Abdomen:  BS normoactive. Soft, Nondistended, non-tender.  No masses or organomegaly.  Extremities: Significantly edematous b/l LE's, R>L, changes of cellulitis noted in RLE  Neurologic: A&O X3, Motor strength is 5/5 in the all 4 extremities  Skin: Cellulitis in RLE    Lab results: Basic  Metabolic Panel:  Recent Labs Lab 11/29/15 0937 11/29/15 1946 11/30/15 0538  NA 144  --  141  K 3.2*  --  3.4*  CL 104  --  108  CO2 23  --  24  GLUCOSE 124*  --  135*  BUN 40*  --  40*  CREATININE 2.01*  --  1.86*  CALCIUM 9.2  --  8.4*  MG  --  2.0 2.0  PHOS  --   --  3.4    Liver Function Tests:  Recent Labs Lab 11/29/15 0937  AST 80*  ALT 24  ALKPHOS 64  BILITOT 1.1  PROT 8.1  ALBUMIN 3.8    Recent Labs Lab 11/29/15 0937  LIPASE 12   No results for input(s): AMMONIA in the last 168 hours.  CBC:  Recent Labs Lab 11/29/15 0937 11/30/15 0538  WBC 16.8* 11.8*  NEUTROABS 16.3* 11.0*  HGB 13.5 11.3*  HCT 41.0 34.5*  MCV 90.0 92.1  PLT 140* 117*    Cardiac Enzymes:  Recent Labs Lab 11/29/15 0937 11/29/15 1935 11/29/15 2313 11/30/15 0538  TROPONINI 5.24* 6.47* 6.01* 4.98*    BNP: Invalid input(s): POCBNP  CBG: No results for input(s): GLUCAP in the last 168 hours.  Microbiology: Results for orders placed or performed during the hospital encounter of 11/29/15  Blood Culture (routine x 2)     Status: None (Preliminary result)   Collection Time: 11/29/15  9:42 AM  Result Value Ref Range Status   Specimen Description BLOOD LEFT AC  Final   Special Requests   Final    BOTTLES DRAWN AEROBIC AND ANAEROBIC AER 4ML ANA 2ML   Culture NO  GROWTH < 24 HOURS  Final   Report Status PENDING  Incomplete  Blood Culture (routine x 2)     Status: None (Preliminary result)   Collection Time: 11/29/15  9:44 AM  Result Value Ref Range Status   Specimen Description BLOOD RIGHT AC  Final   Special Requests   Final    BOTTLES DRAWN AEROBIC AND ANAEROBIC ANA 4ML AER 2ML   Culture NO GROWTH < 24 HOURS  Final   Report Status PENDING  Incomplete  Urine culture     Status: Abnormal (Preliminary result)   Collection Time: 11/29/15 10:00 AM  Result Value Ref Range Status   Specimen Description URINE, RANDOM  Final   Special Requests NONE  Final   Culture (A)   Final    80,000 COLONIES/mL GRAM NEGATIVE RODS IDENTIFICATION AND SUSCEPTIBILITIES TO FOLLOW    Report Status PENDING  Incomplete    Coagulation Studies:  Recent Labs  11/29/15 0937  LABPROT 17.9*  INR 1.47    Urinalysis:  Recent Labs  11/29/15 1000  COLORURINE YELLOW*  LABSPEC 1.014  PHURINE 5.0  GLUCOSEU NEGATIVE  HGBUR 3+*  BILIRUBINUR NEGATIVE  KETONESUR TRACE*  PROTEINUR 100*  NITRITE POSITIVE*  LEUKOCYTESUR TRACE*      Imaging: Dg Chest 1 View  11/30/2015  CLINICAL DATA:  Shortness of breath. EXAM: CHEST 1 VIEW COMPARISON:  Nov 29, 2015. FINDINGS: Stable cardiomegaly. No pneumothorax or pleural effusion is noted. Left lung is clear. New mild right infrahilar opacity is noted concerning for edema or inflammation. Bony thorax is unremarkable. IMPRESSION: New mild right infrahilar opacity is noted concerning for edema or inflammation. Electronically Signed   By: Marijo Conception, M.D.   On: 11/30/2015 14:22   US Venous Img Lower Unilateral Right  11/29/2015  CLINICAL DATA:  Right lower extremity redness and tenderness for 1 month. No known injury. Initial encounter. EXAM: RIGHT LOWER EXTREMITY VENOUS DOPPLER ULTRASOUND TECHNIQUE: Gray-scale sonography with graded compression, as well as color Doppler and duplex ultrasound were performed to evaluate the lower extremity deep venous systems from the level of the common femoral vein and including the common femoral, femoral, profunda femoral, popliteal and calf veins including the posterior tibial, peroneal and gastrocnemius veins when visible. The superficial great saphenous vein was also interrogated. Spectral Doppler was utilized to evaluate flow at rest and with distal augmentation maneuvers in the common femoral, femoral and popliteal veins. COMPARISON:  None. FINDINGS: Contralateral Common Femoral Vein: Respiratory phasicity is normal and symmetric with the symptomatic side. No evidence of thrombus. Normal compressibility.  Common Femoral Vein: No evidence of thrombus. Normal compressibility, respiratory phasicity and response to augmentation. Saphenofemoral Junction: No evidence of thrombus. Normal compressibility and flow on color Doppler imaging. Profunda Femoral Vein: No evidence of thrombus. Normal compressibility and flow on color Doppler imaging. Femoral Vein: No evidence of thrombus. Normal compressibility, respiratory phasicity and response to augmentation. Popliteal Vein: No evidence of thrombus. Normal compressibility, respiratory phasicity and response to augmentation. Calf Veins: No evidence of thrombus. Normal compressibility and flow on color Doppler imaging. Superficial Great Saphenous Vein: No evidence of thrombus. Normal compressibility and flow on color Doppler imaging. Venous Reflux:  None. Other Findings:  None. IMPRESSION: No evidence of deep venous thrombosis. Electronically Signed   By: Inge Rise M.D.   On: 11/29/2015 10:44   Dg Chest Port 1 View  11/29/2015  CLINICAL DATA:  Dizziness, tachycardia for 2 days, right leg swelling EXAM: PORTABLE CHEST 1 VIEW COMPARISON:  08/24/2014  FINDINGS: Borderline cardiomegaly. No acute infiltrate or pleural effusion. No pulmonary edema. Mild elevation of the right hemidiaphragm. Mild degenerative changes thoracic spine. IMPRESSION: No active disease.  Borderline cardiomegaly. Electronically Signed   By: Lahoma Crocker M.D.   On: 11/29/2015 10:50      Assessment & Plan: Pt is a 65 y.o. female with a PMHX of with a PMHx of hypertension, diabetes mellitus type 2, glaucoma, hyperlipidemia, history of tobacco abuse, who was admitted to [facility] on 11/29/2015 for evaluation of  Cellulitis of the right lower extremity as well as malaise.  We were asked to see her for evaluation management of acute renal failure in this setting.  The patient's baseline creatinine appears to be 1.2.   1.  Acute renal failure/chronic kidney disease stage III baseline creatinine 1.2/diabetes  mellitus type 2 with chronic kidney disease/proteinuria.  Acute renal failure secondary to concurrent infection as well as use of Aleve and BC powder. -  The patient has known chronic kidney disease stage II with a baseline creatinine of 1.2.  She is now following with nephrology as an outpatient.  We will hold Lasix for now.  We will also check SPEP, UPEP, ANA, ANCA antibodies, GBM antibodies, C3, and C4.  We will also check renal ultrasound.  Start the patient on very gentle IV fluid hydration with 0.9 normal saline at 50 cc per hour as there may be some element of intravascular volume depletion.  2.  Right lower extremity cellulitis.  This appears to be quite extensive in nature.  Antibiotic management per hospitalist.  Also consider infectious disease consultation.  3.  Hypertension.  Continue metoprolol for now.  Avoid ACE inhibitor or ARB for now.  4.  Thanks for consultation.

## 2015-11-30 NOTE — Progress Notes (Signed)
Updated Dr. Benjie Karvonen on pt status, states she will put some orders in.

## 2015-11-30 NOTE — Progress Notes (Signed)
Pt states she is having a hard time getting her breathe.  Pt using her abominal muscles to breath, lungs coarse, diminished lower lobes bil.  Page out to Dr. Benjie Karvonen

## 2015-11-30 NOTE — Progress Notes (Signed)
Nurse reports increased work of breathing and SOB. Start IV lasix as patient likelyhas pulm edema from RVR and NSTEMI CXR Stop IVF

## 2015-11-30 NOTE — Progress Notes (Signed)
Pt still SOB, O2 87 on 2LO2 per [place].  Dr. Benjie Karvonen paged

## 2015-11-30 NOTE — Progress Notes (Signed)
Patient with increased work of breathing. She is complaining shortness of breath. She denies chest pain or abdominal pain.  She is 88-90% on 4 L oxygen.  Vital signs reviewed Gen. increased work of breathing tachypnea Cardiovascular tachycardiac, irregular irregular no murmurs Lungs crackles bilaterally with wheezing on the right side Extremity right leg severe cellulitis 3+ edema  Assessment and plan:  1. Acute hypoxic respiratory failure: This is likely due to pulmonary edema from atrial fibrillation RVR. Patient also has some wheezing which may be from fluid or from bronchospasm. Continue IV Lasix Start IV steroids Discussed with RT patient may need high flow nasal cannula/ BiPAP Start DuoNeb's  TIME 30 minutes

## 2015-11-30 NOTE — Care Management (Signed)
CM attempted to screen patient.  She asks for CM to come back a a later time.  Patient is short of breath and on high flow nasal cannula.  She is trying to take some nourishment.

## 2015-11-30 NOTE — Evaluation (Signed)
Physical Therapy Evaluation Patient Details Name: DYANNI BATTISTINI MRN: [id number] DOB: 02-19-1951 Today's Date: 11/30/2015   History of Present Illness  Pt is a 65 y.o. F admitted to hospital for sepsis, A-fib and acute NSTEMI. Pt has hx of HTN, DM, glaucoma, and hyperlipidemia.   Clinical Impression  Pt is a pleasant 65 y.o. F admitted to hospital for sepsis, A-Fib and acute NSTEMI. Prior to admission, pt lived at home with husband. Pt did not use AD for ambulation and was independent with all ADLs. Pt awake and oriented during evaluation. Pt demonstrated fair B UE and L LE strength, and poor R LE strength d/t pain. Pt able to perform bed mobility with modified independence. Pt able to transfer with CGA for safety. Pt able to ambulate approx 5 ft in room with CGA. Ambulation limited by pts fatigue. Pts O2 sat at 89% on HFNC during there-ex, however increased to 93% during there-ex. O2 sat at 96% after ambulation. Pt demonstrates deficits in strength, mobility and endurance. Pt would benefit from further skilled PT to address deficits; recommend pt receive home health PT after discharge from acute hospitalization.     Follow Up Recommendations Home health PT    Equipment Recommendations       Recommendations for Other Services       Precautions / Restrictions Precautions Precautions: Fall Restrictions Weight Bearing Restrictions: No      Mobility  Bed Mobility Overal bed mobility: Modified Independent             General bed mobility comments: Pt able to perform bed mobility with modified independence. Pt required use of bed railings and increased time to sit on EOB.   Transfers Overall transfer level: Needs assistance Equipment used: None Transfers: Sit to/from Stand Sit to Stand: Min guard         General transfer comment: Pt able to stand from EOB using CGA for safety and no AD. Pt required increased time to stand.   Ambulation/Gait Ambulation/Gait assistance: Min  guard Ambulation Distance (Feet): 5 Feet Assistive device: None Gait Pattern/deviations: Step-through pattern Gait velocity: slow   General Gait Details: Pt able to ambulate in room approx 5 ft with CGA. Pt demonstrated slow, step-through gait pattern. Pt provided cues regarding turns with lines/leads. Ambulation limited by pt connected to HFNC. Pt became fatigued and requested to sit down.   Stairs            Wheelchair Mobility    Modified Rankin (Stroke Patients Only)       Balance Overall balance assessment: Modified Independent (Pt able to maintain sitting balance with B LE/UE supported)                                           Pertinent Vitals/Pain Pain Assessment: Faces Faces Pain Scale: Hurts a little bit    Home Living Family/patient expects to be discharged to:: Private residence Living Arrangements: Spouse/significant other Available Help at Discharge: Family Type of Home: House Home Access: Stairs to enter Entrance Stairs-Rails: None Entrance Stairs-Number of Steps: 2 Home Layout: One level Home Equipment: None Additional Comments: Pt lives with husband who is available to help assist.     Prior Function Level of Independence: Independent         Comments: Pt independent with all ADLs. Did not use an AD prior to admission.  Hand Dominance        Extremity/Trunk Assessment   Upper Extremity Assessment: RUE deficits/detail;LUE deficits/detail RUE Deficits / Details: R UE grossly 4-/5 strength     LUE Deficits / Details: L UE grossly 4-/5 strength   Lower Extremity Assessment: RLE deficits/detail;LLE deficits/detail RLE Deficits / Details: R ankle 4+/5 strength, R knee grossly 3/5 strength d/t pain LLE Deficits / Details: L LE grossly 4+/5 strength     Communication   Communication: No difficulties  Cognition Arousal/Alertness: Awake/alert Behavior During Therapy: WFL for tasks assessed/performed Overall  Cognitive Status: Within Functional Limits for tasks assessed                      General Comments      Exercises Other Exercises Other Exercises: Pt performed supine ther-ex on B LE including SLR, quad sets, and hip ab/ad with min assist. Pt required cues for proper form of exercises. All ther-ex performed x10 reps.       Assessment/Plan    PT Assessment Patient needs continued PT services  PT Diagnosis Difficulty walking;Abnormality of gait   PT Problem List Decreased strength;Decreased mobility;Cardiopulmonary status limiting activity  PT Treatment Interventions Gait training;Therapeutic activities;Therapeutic exercise   PT Goals (Current goals can be found in the Care Plan section) Acute Rehab PT Goals Patient Stated Goal: to return to PLOF PT Goal Formulation: With patient Time For Goal Achievement: 12/14/15 Potential to Achieve Goals: Fair    Frequency Min 2X/week   Barriers to discharge        Co-evaluation               End of Session Equipment Utilized During Treatment: Gait belt;Oxygen Activity Tolerance: Patient limited by fatigue Patient left: in bed;with call bell/phone within reach;with bed alarm set;with family/visitor present           Time: [id number] PT Time Calculation (min) (ACUTE ONLY): 22 min   Charges:         PT G Codes:        Sherral Hammers 12-06-2015, 4:54 PM M. Barnett Abu, SPT

## 2015-11-30 NOTE — Progress Notes (Addendum)
[facility] [place] NAME: Patricia Ewing    MR#:  [id number]  DATE OF BIRTH:  1950-08-18  SUBJECTIVE:   Patient denies chest pain, Pop additions or shortness of breath  REVIEW OF SYSTEMS:    Review of Systems  Constitutional: Negative for fever, chills and malaise/fatigue.  HENT: Negative for ear discharge, ear pain, hearing loss, nosebleeds and sore throat.   Eyes: Negative for blurred vision and pain.  Respiratory: Negative for cough, hemoptysis, shortness of breath and wheezing.   Cardiovascular: Negative for chest pain, palpitations and leg swelling.  Gastrointestinal: Negative for nausea, vomiting, abdominal pain, diarrhea and blood in stool.  Genitourinary: Negative for dysuria.  Musculoskeletal: Negative for back pain.  Skin:       RLE redness  Neurological: Negative for dizziness, tremors, speech change, focal weakness, seizures and headaches.  Endo/Heme/Allergies: Does not bruise/bleed easily.  Psychiatric/Behavioral: Negative for depression, suicidal ideas and hallucinations.    Tolerating Diet:yes      DRUG ALLERGIES:   Allergies  Allergen Reactions  . Contrast Media [Iodinated Diagnostic Agents] Other (See Comments)    Flushed, cough, anxiety, mild less responsiveness.    VITALS:  Blood pressure 149/84, pulse 66, temperature 99.3 F (37.4 C), temperature source Oral, resp. rate 18, height 5\' 6"  (1.676 m), weight 99.474 kg (219 lb 4.8 oz), SpO2 87 %.  PHYSICAL EXAMINATION:   Physical Exam  Constitutional: She is oriented to person, place, and time and well-developed, well-nourished, and in no distress. No distress.  HENT:  Head: Normocephalic.  Eyes: No scleral icterus.  Neck: Normal range of motion. Neck supple. No JVD present. No tracheal deviation present.  Cardiovascular: Normal rate, regular rhythm and normal heart sounds.  Exam reveals no gallop and no friction rub.   No murmur heard. Pulmonary/Chest:  Effort normal. No respiratory distress. She has wheezes. She has no rales. She exhibits no tenderness.  Abdominal: Soft. Bowel sounds are normal. She exhibits no distension and no mass. There is no tenderness. There is no rebound and no guarding.  Musculoskeletal: Normal range of motion. She exhibits no edema.  Neurological: She is alert and oriented to person, place, and time.  Skin: Skin is warm. There is erythema.  extensive RLE cellulitis  Psychiatric: Affect and judgment normal.      LABORATORY PANEL:   CBC  Recent Labs Lab 11/30/15 0538  WBC 11.8*  HGB 11.3*  HCT 34.5*  PLT 117*   ------------------------------------------------------------------------------------------------------------------  Chemistries   Recent Labs Lab 11/29/15 0937  11/30/15 0538  NA 144  --  141  K 3.2*  --  3.4*  CL 104  --  108  CO2 23  --  24  GLUCOSE 124*  --  135*  BUN 40*  --  40*  CREATININE 2.01*  --  1.86*  CALCIUM 9.2  --  8.4*  MG  --   < > 2.0  AST 80*  --   --   ALT 24  --   --   ALKPHOS 64  --   --   BILITOT 1.1  --   --   < > = values in this interval not displayed. ------------------------------------------------------------------------------------------------------------------  Cardiac Enzymes  Recent Labs Lab 11/29/15 1935 11/29/15 2313 11/30/15 0538  TROPONINI 6.47* 6.01* 4.98*   ------------------------------------------------------------------------------------------------------------------  RADIOLOGY:  US Venous Img Lower Unilateral Right  11/29/2015  CLINICAL DATA:  Right lower extremity redness and tenderness for 1 month. No known injury. Initial  encounter. EXAM: RIGHT LOWER EXTREMITY VENOUS DOPPLER ULTRASOUND TECHNIQUE: Gray-scale sonography with graded compression, as well as color Doppler and duplex ultrasound were performed to evaluate the lower extremity deep venous systems from the level of the common femoral vein and including the common femoral,  femoral, profunda femoral, popliteal and calf veins including the posterior tibial, peroneal and gastrocnemius veins when visible. The superficial great saphenous vein was also interrogated. Spectral Doppler was utilized to evaluate flow at rest and with distal augmentation maneuvers in the common femoral, femoral and popliteal veins. COMPARISON:  None. FINDINGS: Contralateral Common Femoral Vein: Respiratory phasicity is normal and symmetric with the symptomatic side. No evidence of thrombus. Normal compressibility. Common Femoral Vein: No evidence of thrombus. Normal compressibility, respiratory phasicity and response to augmentation. Saphenofemoral Junction: No evidence of thrombus. Normal compressibility and flow on color Doppler imaging. Profunda Femoral Vein: No evidence of thrombus. Normal compressibility and flow on color Doppler imaging. Femoral Vein: No evidence of thrombus. Normal compressibility, respiratory phasicity and response to augmentation. Popliteal Vein: No evidence of thrombus. Normal compressibility, respiratory phasicity and response to augmentation. Calf Veins: No evidence of thrombus. Normal compressibility and flow on color Doppler imaging. Superficial Great Saphenous Vein: No evidence of thrombus. Normal compressibility and flow on color Doppler imaging. Venous Reflux:  None. Other Findings:  None. IMPRESSION: No evidence of deep venous thrombosis. Electronically Signed   By: Inge Rise M.D.   On: 11/29/2015 10:44   Dg Chest Port 1 View  11/29/2015  CLINICAL DATA:  Dizziness, tachycardia for 2 days, right leg swelling EXAM: PORTABLE CHEST 1 VIEW COMPARISON:  08/24/2014 FINDINGS: Borderline cardiomegaly. No acute infiltrate or pleural effusion. No pulmonary edema. Mild elevation of the right hemidiaphragm. Mild degenerative changes thoracic spine. IMPRESSION: No active disease.  Borderline cardiomegaly. Electronically Signed   By: Lahoma Crocker M.D.   On: 11/29/2015 10:50      ASSESSMENT AND PLAN:    65 year old female with history of essential hypertension and tobacco abuse who presents with weakness over the past several days and found to have atrial fibrillation and RVR and sepsis from urinary tract infection.  1. Sepsis: Patient presented with leukocytosis, fever, tachycardia.  sepsis is secondary UTI/cellulitis Urine culture 80,000 gram-negative rods. Blood cultures are negative today.  Continue Zosyn. Discontinue vancomycin for now Elevated leg ID consult Lactic acid is now within normal limits. 2. Acute Non-ST elevation MI with peak troponin 6.47: Continue heparin drip, metoprolol atorvastatin, and aspirin. Follow up on echocardiogram.  3. Atrial fibrillation with RVR: Patient has now converted to sinus tachycardia. Continue heparin drip, PO amiodarone and metoprolol. Follow up  on echocardiogram. TSH is low, check T3 and T4.  4. Acute kidney injury due to ATN from atrial fibrillation, sepsis and non-ST elevation MI. Creatinine is improving with IV fluids. Patient has adequate urine output. Repeat BMP in a.m.   5. Glaucoma: Continue eyedrops. Management plans discussed with the patient and she is in agreement.  CODE STATUS: FULL  TOTAL TIME TAKING CARE OF THIS PATIENT: 33 minutes.     POSSIBLE D/C 3-4 days, DEPENDING ON CLINICAL CONDITION.   Tykia Mellone M.D on 11/30/2015 at 11:15 AM  Between 7am to 6pm - Pager - [phone] After 6pm go to [email or web address] - password [email or web address] [facility]  Office  [phone]  CC: Primary care physician; Madelyn Brunner, MD  Note: This dictation was prepared with Dragon dictation along with smaller phrase technology. Any transcriptional errors that result from this  process are unintentional.

## 2015-11-30 NOTE — Progress Notes (Signed)
Back from ultrasound

## 2015-12-01 ENCOUNTER — Inpatient Hospital Stay: Payer: Medicare Other

## 2015-12-01 LAB — PROTEIN ELECTRO, RANDOM URINE
ALPHA-2-GLOBULIN, U: 26.8 %
Albumin ELP, Urine: 22.9 %
Alpha-1-Globulin, U: 4.7 %
Beta Globulin, U: 21.6 %
GAMMA GLOBULIN, U: 24 %
M SPIKE UR: 2.9 % — AB
Total Protein, Urine: 22.9 mg/dL

## 2015-12-01 LAB — BASIC METABOLIC PANEL
Anion gap: 8 (ref 5–15)
BUN: 39 mg/dL — ABNORMAL HIGH (ref 6–20)
CALCIUM: 8.7 mg/dL — AB (ref 8.9–10.3)
CO2: 26 mmol/L (ref 22–32)
CREATININE: 1.67 mg/dL — AB (ref 0.44–1.00)
Chloride: 107 mmol/L (ref 101–111)
GFR calc Af Amer: 36 mL/min — ABNORMAL LOW (ref >60)
GFR, EST NON AFRICAN AMERICAN: 31 mL/min — AB (ref >60)
Glucose, Bld: 189 mg/dL — ABNORMAL HIGH (ref 65–99)
POTASSIUM: 3.2 mmol/L — AB (ref 3.5–5.1)
SODIUM: 141 mmol/L (ref 135–145)

## 2015-12-01 LAB — CBC
HCT: 31.7 % — ABNORMAL LOW (ref 35.0–47.0)
Hemoglobin: 10.4 g/dL — ABNORMAL LOW (ref 12.0–16.0)
MCH: 29.6 pg (ref 26.0–34.0)
MCHC: 33 g/dL (ref 32.0–36.0)
MCV: 89.6 fL (ref 80.0–100.0)
PLATELETS: 120 10*3/uL — AB (ref 150–440)
RBC: 3.54 MIL/uL — AB (ref 3.80–5.20)
RDW: 15.5 % — AB (ref 11.5–14.5)
WBC: 14.9 10*3/uL — AB (ref 3.6–11.0)

## 2015-12-01 LAB — GLOMERULAR BASEMENT MEMBRANE ANTIBODIES: GBM Ab: 3 units (ref 0–20)

## 2015-12-01 LAB — PROTEIN ELECTROPHORESIS, SERUM
A/G Ratio: 0.8 (ref 0.7–1.7)
Albumin ELP: 2.8 g/dL — ABNORMAL LOW (ref 2.9–4.4)
Alpha-1-Globulin: 0.6 g/dL — ABNORMAL HIGH (ref 0.0–0.4)
Alpha-2-Globulin: 1.1 g/dL — ABNORMAL HIGH (ref 0.4–1.0)
BETA GLOBULIN: 0.7 g/dL (ref 0.7–1.3)
GAMMA GLOBULIN: 1.2 g/dL (ref 0.4–1.8)
Globulin, Total: 3.7 g/dL (ref 2.2–3.9)
Total Protein ELP: 6.5 g/dL (ref 6.0–8.5)

## 2015-12-01 LAB — MPO/PR-3 (ANCA) ANTIBODIES
ANCA Proteinase 3: <3.5 U/mL (ref 0.0–3.5)
Myeloperoxidase Abs: <9 U/mL (ref 0.0–9.0)

## 2015-12-01 LAB — PHOSPHORUS: Phosphorus: 2.9 mg/dL (ref 2.5–4.6)

## 2015-12-01 LAB — C3 COMPLEMENT: C3 Complement: 129 mg/dL (ref 82–167)

## 2015-12-01 LAB — HEPARIN LEVEL (UNFRACTIONATED): Heparin Unfractionated: 0.47 IU/mL (ref 0.30–0.70)

## 2015-12-01 LAB — URINE CULTURE: Culture: 80000 — AB

## 2015-12-01 LAB — PARATHYROID HORMONE, INTACT (NO CA): PTH: 73 pg/mL — ABNORMAL HIGH (ref 15–65)

## 2015-12-01 LAB — C4 COMPLEMENT: Complement C4, Body Fluid: 19 mg/dL (ref 14–44)

## 2015-12-01 LAB — POTASSIUM: POTASSIUM: 3.4 mmol/L — AB (ref 3.5–5.1)

## 2015-12-01 LAB — MAGNESIUM: MAGNESIUM: 2 mg/dL (ref 1.7–2.4)

## 2015-12-01 LAB — T3 UPTAKE: T3 Uptake Ratio: 34 % (ref 24–39)

## 2015-12-01 IMAGING — DX DG CHEST 1V
1 series · 1 of 1 positions shown · non-contrast
Comparison: [DATE]

CLINICAL DATA: Hypoxia.  Sepsis.

EXAM:
CHEST 1 VIEW

[chest ap]
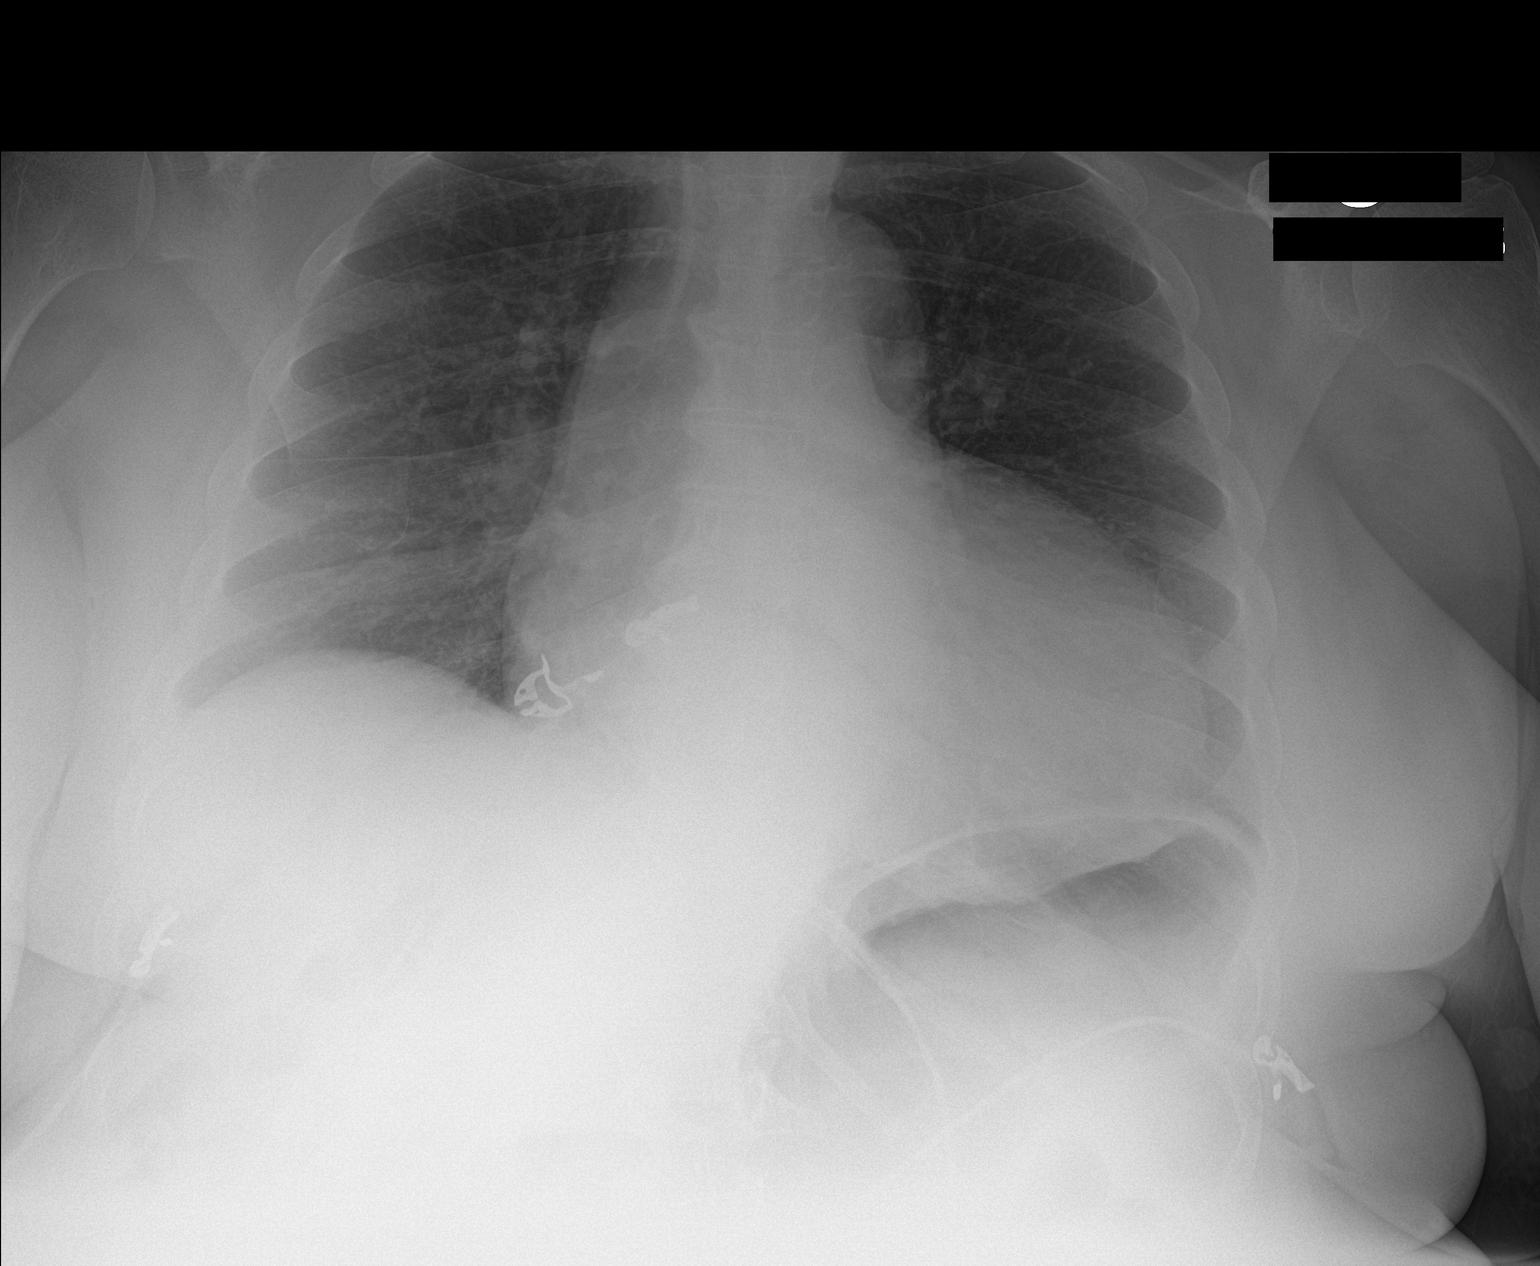

[1 of 1 positions shown; findings below may reference images not displayed]

FINDINGS: Moderate cardiomegaly stable. Both lungs are clear. Previously seen
right infrahilar opacity is no longer visualized on today's exam. No
evidence of pleural effusion or pneumothorax.
IMPRESSION: Cardiomegaly.  No active lung disease seen on today's exam.

## 2015-12-01 IMAGING — CT CT ABD-PELV W/O CM
2 of 4 series · 15 of 46 positions shown, 17 images · non-contrast
Comparison: Renal ultrasound dated [DATE].

CLINICAL DATA: Bilateral hydronephrosis.  Acute renal failure.

EXAM:
CT ABDOMEN AND PELVIS WITHOUT CONTRAST
TECHNIQUE: Multidetector CT imaging of the abdomen and pelvis was performed
following the standard protocol without IV contrast.

[Series 2: routine abd pel wo · axial · 0.87mm/px · z∈[-460,-20]mm · 12 of 97 slices shown, 14 images]
[im 5/97  soft-tissue]
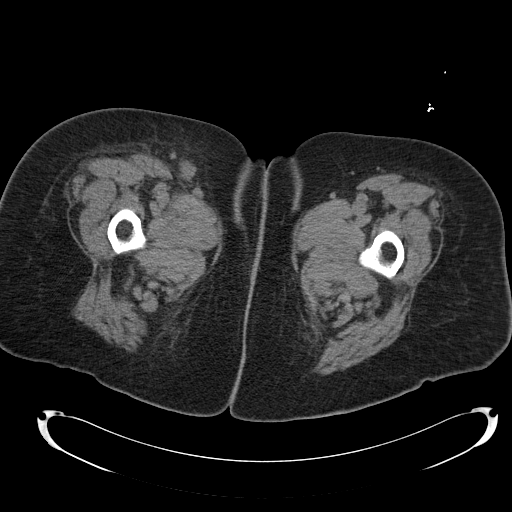
[im 5/97  bone]
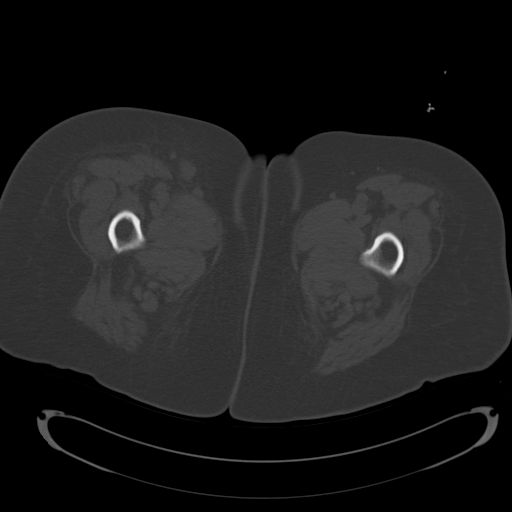
[im 13/97  soft-tissue]
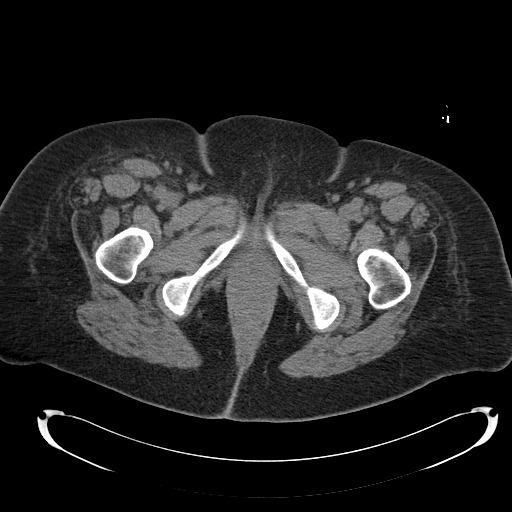
[im 21/97  soft-tissue]
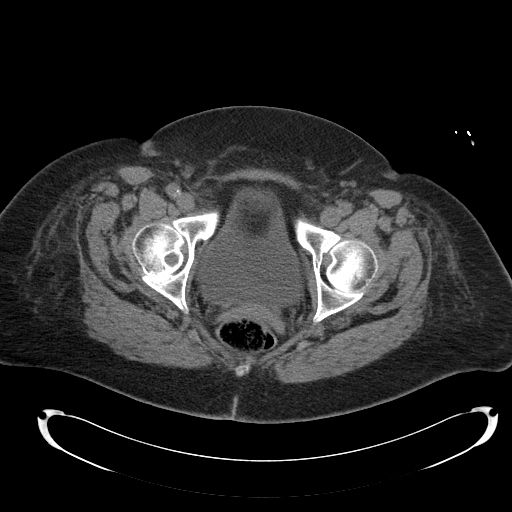
[im 29/97  soft-tissue]
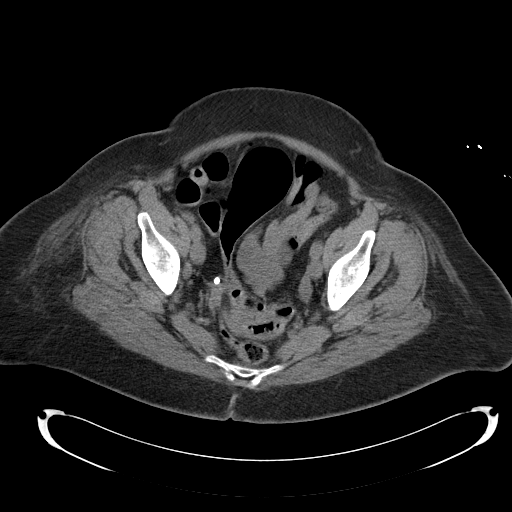
[im 37/97  soft-tissue]
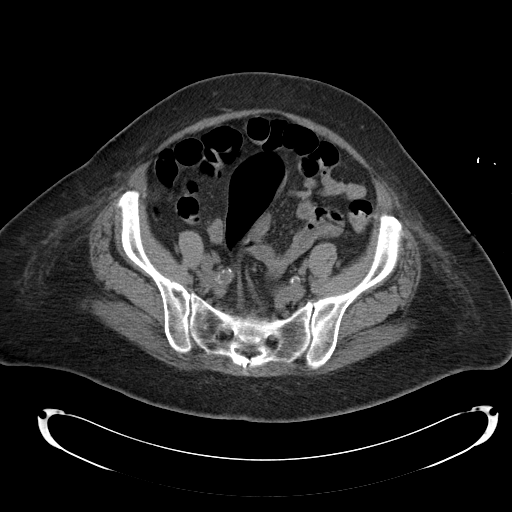
[im 45/97  soft-tissue]
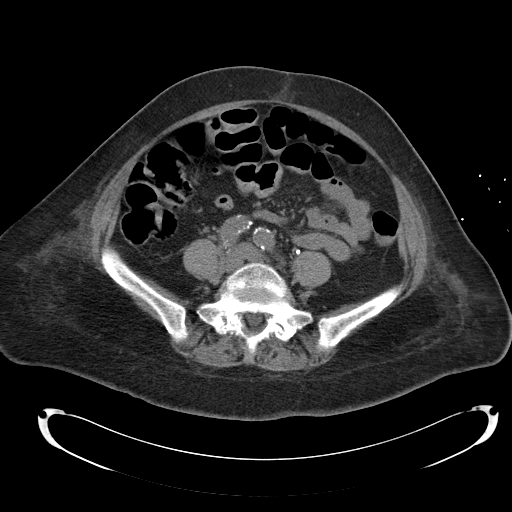
[im 53/97  soft-tissue]
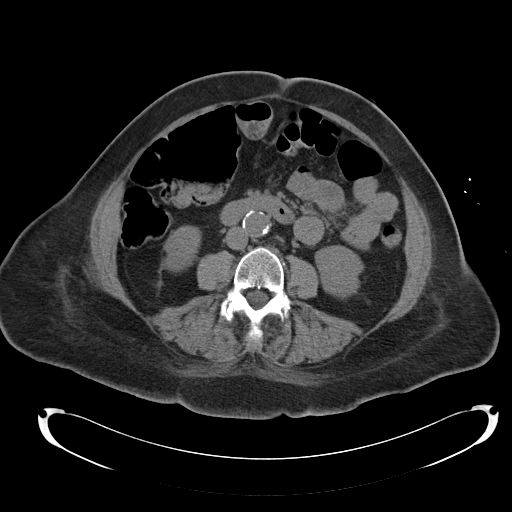
[im 61/97  soft-tissue]
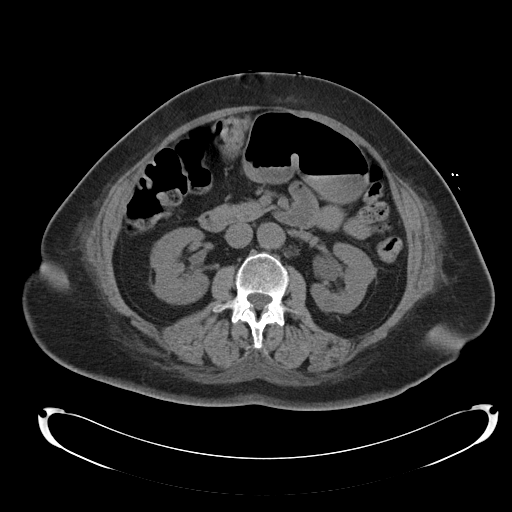
[im 69/97  soft-tissue]
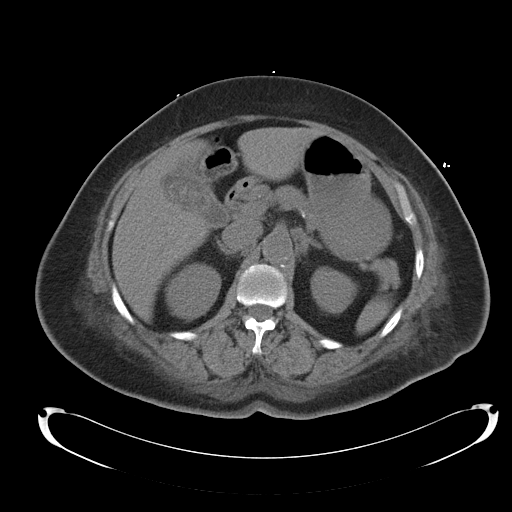
[im 69/97  bone]
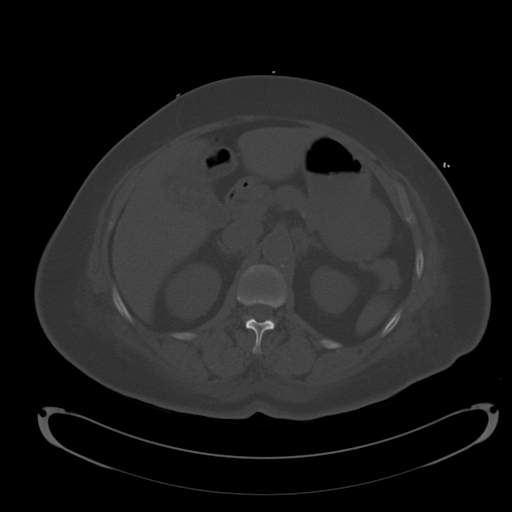
[im 77/97  soft-tissue]
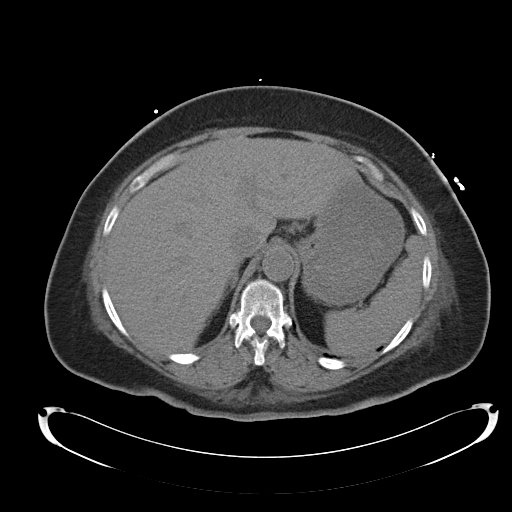
[im 85/97  soft-tissue]
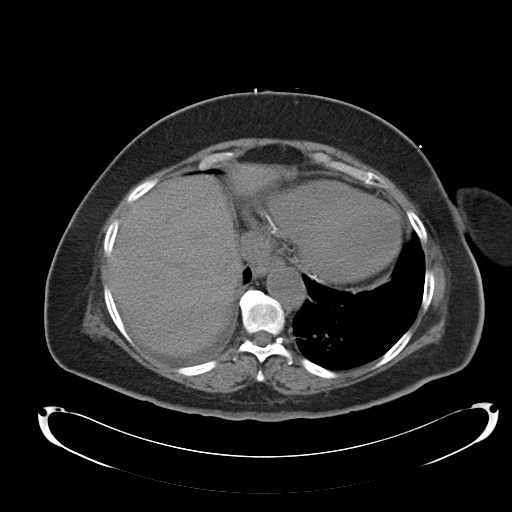
[im 93/97  soft-tissue]
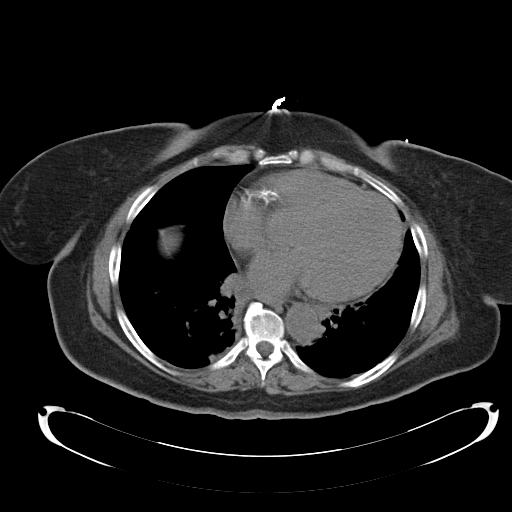

[Series 5: cor routine abd pel wo · coronal · 0.79mm/px · 3 of 145 slices shown]
[im 49/145  soft-tissue]
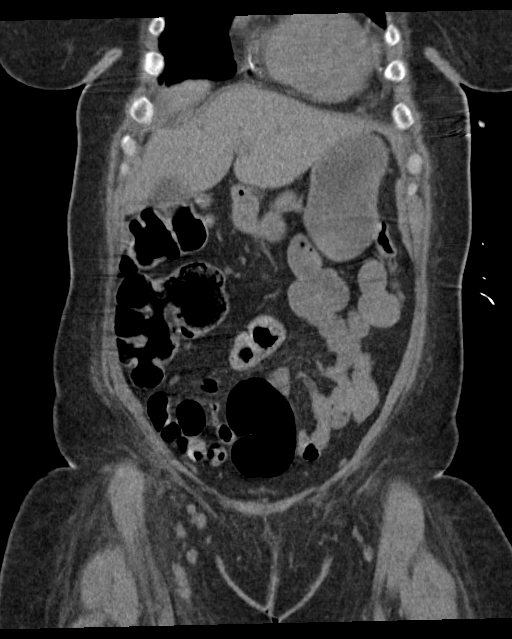
[im 65/145  soft-tissue]
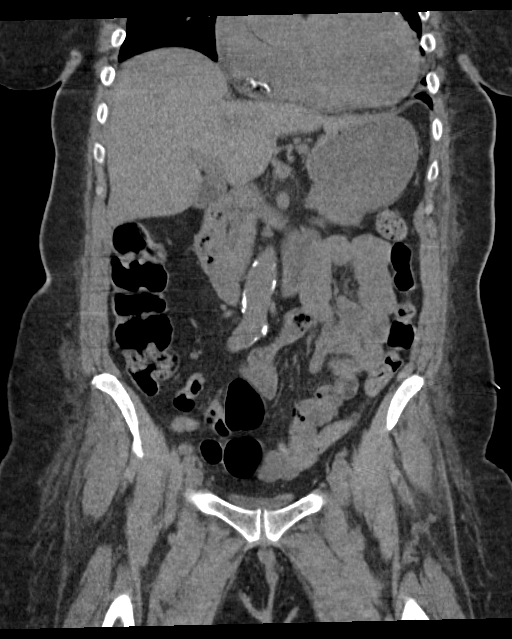
[im 81/145  soft-tissue]
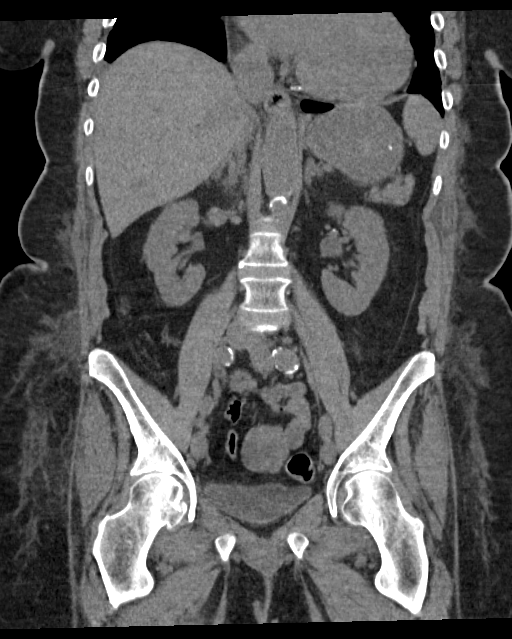

[15 of 46 positions shown; findings below may reference images not displayed]

FINDINGS: Lower chest: Dense consolidation at the right lung base could
represent atelectasis, aspiration or pneumonia. Small adjacent
pleural effusion. Probable cardiomegaly, incompletely imaged.

Hepatobiliary: Gallbladder appears to be filled with stones but
there are no adjacent inflammatory changes to suggest acute
cholecystitis. No mass or focal lesion identified within the liver
on this unenhanced exam.

Pancreas: No mass or inflammatory process identified on this
un-enhanced exam.

Spleen: Within normal limits in size.

Adrenals/Urinary Tract: Left adrenal mass measures approximately
x 1.3 cm with CT density measurements suggesting benign lipid-rich
adrenal adenoma. Right adrenal gland appears normal.

There is renal pelviectasis bilaterally, versus minimal bilateral
hydronephrosis. Left renal cyst measuring 1.9 cm. No hydroureter. No
renal or ureteral calculi. Bladder appears normal. No bladder stone
or bladder wall thickening.

Stomach/Bowel: Bowel is normal in caliber. There is diverticulosis
of the sigmoid colon but no inflammatory changes to suggest acute
diverticulitis. No bowel wall thickening or evidence of bowel wall
inflammation seen. Appendix is normal. Stomach appears normal.

Vascular/Lymphatic: Scattered atherosclerotic changes of the normal-
caliber abdominal aorta. No enlarged lymph nodes seen.

Reproductive: Status post hysterectomy. Adnexal regions are
unremarkable.

Other: No free fluid or abscess collections seen. No free
intraperitoneal air.

Musculoskeletal: Degenerative changes within the lumbar spine. Large
degenerative subchondral cysts within the right femoral head. No
acute or suspicious osseous lesions seen.

Single enlarged lymph node is seen in the right inguinal/groin
region measuring 1.7 cm short axis dimension. Scattered additional
smaller lymph nodes within the bilateral groin regions. Mild edema
within the subcutaneous soft tissues of the right groin (localized
cellulitis? ).
IMPRESSION: 1. Dense consolidation within the posterior right lower lung which
could represent pneumonia, aspiration or atelectasis. Small adjacent
pleural effusion.
2. Minimal bilateral hydronephrosis versus pelviectasis. No renal or
ureteral calculi. No ureteral dilatation. Bladder appears normal.
3. Left adrenal mass measuring 1.9 x 1.3 cm, too small to
definitively characterize but with CT density measurements
suggesting benign adrenal adenoma. Could consider follow-up with
adrenal protocol MRI after current issues are resolved.
4. Sigmoid colon diverticulosis without evidence of acute
diverticulitis.
5. Cholelithiasis without evidence of acute cholecystitis.
6. Single enlarged lymph node in the right inguinal/groin region
measuring 1.7 cm short axis dimension. Several additional smaller
lymph nodes within the bilateral groin regions. Perhaps mild
edema-like change within the subcutaneous soft tissues of the right
groin (localized mild cellulitis?, mild lymphadenitis?).

## 2015-12-01 IMAGING — NM NM PULMONARY VENT & PERF
2 series · 16 of 16 positions shown · non-contrast
Comparison: Chest radiograph 537 T

CLINICAL DATA: Hypoxia, short of breath.  Lower extremity swelling.

EXAM:
NUCLEAR MEDICINE VENTILATION - PERFUSION LUNG SCAN
TECHNIQUE: Ventilation images were obtained in multiple projections using
inhaled aerosol [AZ] DTPA. Perfusion images were obtained in
multiple projections after intravenous injection of [AZ] MAA.
RADIOPHARMACEUTICALS:  32.8 mCi [AZ] DTPA aerosol
inhalation and 4.2 mCi [AZ] MAA IV

[Series 1000: lung perfusion · 1.95mm/px · 4 acquisitions, 8 frames shown]
[im 1/4]
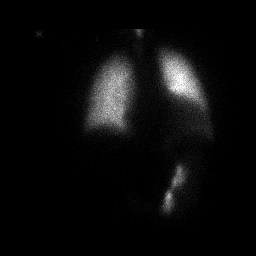
[im 1/4]
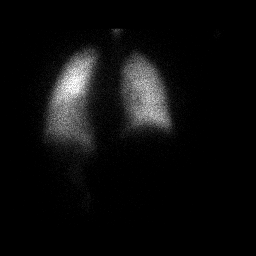
[im 2/4]
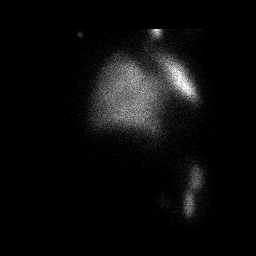
[im 2/4]
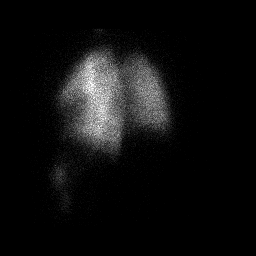
[im 3/4  full-range]
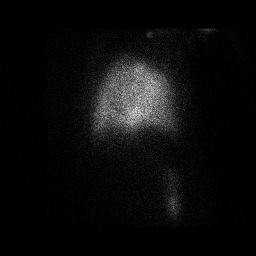
[im 3/4  full-range]
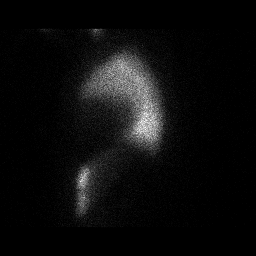
[im 4/4]
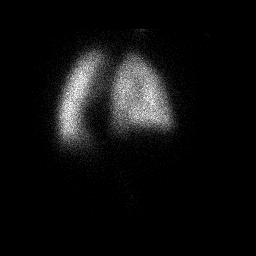
[im 4/4]
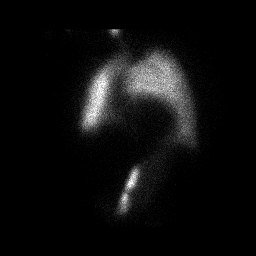

[Series 1000: lung ventilation · 3.90mm/px · 4 acquisitions, 8 frames shown]
[im 1/4]
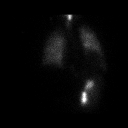
[im 1/4]
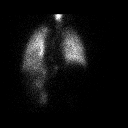
[im 2/4]
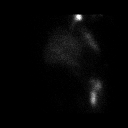
[im 2/4]
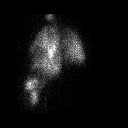
[im 3/4]
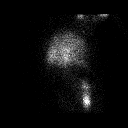
[im 3/4]
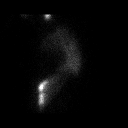
[im 4/4]
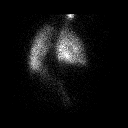
[im 4/4]
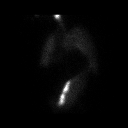

[16 of 16 positions shown; findings below may reference images not displayed]

FINDINGS: Ventilation: No focal ventilation defect.

Perfusion: No wedge shaped peripheral perfusion defects to suggest
acute pulmonary embolism.

Cardiomegaly noted.
IMPRESSION: No evidence of pulmonary embolism.

## 2015-12-01 MED ORDER — METOPROLOL TARTRATE 50 MG PO TABS
50.0000 mg | ORAL_TABLET | Freq: Two times a day (BID) | ORAL | Status: DC
  Administered 2015-12-01 – 2015-12-06 (×10): 50 mg via ORAL
  Filled 2015-12-01 (×11): qty 1

## 2015-12-01 MED ORDER — POTASSIUM CHLORIDE CRYS ER 20 MEQ PO TBCR: 20.0000 meq | EXTENDED_RELEASE_TABLET | Freq: Once | ORAL | Status: DC

## 2015-12-01 MED ORDER — POTASSIUM CHLORIDE CRYS ER 20 MEQ PO TBCR
40.0000 meq | EXTENDED_RELEASE_TABLET | Freq: Once | ORAL | Status: AC
  Administered 2015-12-01: 40 meq via ORAL
  Filled 2015-12-01: qty 2

## 2015-12-01 MED ORDER — TECHNETIUM TC 99M DIETHYLENETRIAME-PENTAACETIC ACID
32.8470 | Freq: Once | INTRAVENOUS | Status: AC | PRN
  Administered 2015-12-01: 32.847 via INTRAVENOUS

## 2015-12-01 MED ORDER — CEFAZOLIN SODIUM-DEXTROSE 2-4 GM/100ML-% IV SOLN
2.0000 g | Freq: Three times a day (TID) | INTRAVENOUS | Status: DC
  Administered 2015-12-01 – 2015-12-02 (×2): 2 g via INTRAVENOUS
  Filled 2015-12-01 (×4): qty 100

## 2015-12-01 MED ORDER — POTASSIUM CHLORIDE CRYS ER 20 MEQ PO TBCR
40.0000 meq | EXTENDED_RELEASE_TABLET | Freq: Every day | ORAL | Status: DC
  Administered 2015-12-02 – 2015-12-06 (×5): 40 meq via ORAL
  Filled 2015-12-01 (×5): qty 2

## 2015-12-01 MED ORDER — TECHNETIUM TO 99M ALBUMIN AGGREGATED
4.1180 | Freq: Once | INTRAVENOUS | Status: AC | PRN
  Administered 2015-12-01: 4.118 via INTRAVENOUS

## 2015-12-01 NOTE — Progress Notes (Signed)
[facility] [place] NAME: Patricia Ewing    MR#:  [id number]  DATE OF BIRTH:  Dec 26, 1950  SUBJECTIVE:   Patient Had increasing shortness of breath or respiratory distress yesterday she was placed on high flow nasal cannula. I started IV steroids and IV Lasix. Chest x-ray shows no acute infiltrate. Patient is breathing better this morning. She remains on high flow nasal cannula.  REVIEW OF SYSTEMS:    Review of Systems  Constitutional: Negative for fever, chills and malaise/fatigue.  HENT: Negative for ear discharge, ear pain, hearing loss, nosebleeds and sore throat.   Eyes: Negative for blurred vision and pain.  Respiratory: Negative for cough, hemoptysis, shortness of breath and wheezing.   Cardiovascular: Negative for chest pain, palpitations and leg swelling.  Gastrointestinal: Negative for nausea, vomiting, abdominal pain, diarrhea and blood in stool.  Genitourinary: Negative for dysuria.  Musculoskeletal: Negative for back pain.  Skin:       RLE redness  Neurological: Negative for dizziness, tremors, speech change, focal weakness, seizures and headaches.  Endo/Heme/Allergies: Does not bruise/bleed easily.  Psychiatric/Behavioral: Negative for depression, suicidal ideas and hallucinations.    Tolerating Diet:yes      DRUG ALLERGIES:   Allergies  Allergen Reactions  . Contrast Media [Iodinated Diagnostic Agents] Other (See Comments)    Flushed, cough, anxiety, mild less responsiveness.    VITALS:  Blood pressure 152/87, pulse 73, temperature 97.8 F (36.6 C), temperature source Oral, resp. rate 20, height 5\' 6"  (1.676 m), weight 99.474 kg (219 lb 4.8 oz), SpO2 100 %.  PHYSICAL EXAMINATION:   Physical Exam  Constitutional: She is oriented to person, place, and time and well-developed, well-nourished, and in no distress. No distress.  HENT:  Head: Normocephalic.  Eyes: No scleral icterus.  Neck: Normal range of motion.  Neck supple. No JVD present. No tracheal deviation present.  Cardiovascular: Normal rate, regular rhythm and normal heart sounds.  Exam reveals no gallop and no friction rub.   No murmur heard. Pulmonary/Chest: Effort normal. No respiratory distress. She has wheezes. She has no rales. She exhibits no tenderness.  Abdominal: Soft. Bowel sounds are normal. She exhibits no distension and no mass. There is no tenderness. There is no rebound and no guarding.  Musculoskeletal: Normal range of motion. She exhibits no edema.  Neurological: She is alert and oriented to person, place, and time.  Skin: Skin is warm. There is erythema.  extensive RLE cellulitis  Psychiatric: Affect and judgment normal.      LABORATORY PANEL:   CBC  Recent Labs Lab 12/01/15 0505  WBC 14.9*  HGB 10.4*  HCT 31.7*  PLT 120*   ------------------------------------------------------------------------------------------------------------------  Chemistries   Recent Labs Lab 11/29/15 0937  12/01/15 0505  NA 144  < > 141  K 3.2*  < > 3.2*  CL 104  < > 107  CO2 23  < > 26  GLUCOSE 124*  < > 189*  BUN 40*  < > 39*  CREATININE 2.01*  < > 1.67*  CALCIUM 9.2  < > 8.7*  MG  --   < > 2.0  AST 80*  --   --   ALT 24  --   --   ALKPHOS 64  --   --   BILITOT 1.1  --   --   < > = values in this interval not displayed. ------------------------------------------------------------------------------------------------------------------  Cardiac Enzymes  Recent Labs Lab 11/29/15 1935 11/29/15 2313 11/30/15 [id number]  TROPONINI  6.47* 6.01* 4.98*   ------------------------------------------------------------------------------------------------------------------  RADIOLOGY:  Dg Chest 1 View  11/30/2015  CLINICAL DATA:  Shortness of breath. EXAM: CHEST 1 VIEW COMPARISON:  Nov 29, 2015. FINDINGS: Stable cardiomegaly. No pneumothorax or pleural effusion is noted. Left lung is clear. New mild right infrahilar opacity is  noted concerning for edema or inflammation. Bony thorax is unremarkable. IMPRESSION: New mild right infrahilar opacity is noted concerning for edema or inflammation. Electronically Signed   By: Marijo Conception, M.D.   On: 11/30/2015 14:22   US Renal  11/30/2015  CLINICAL DATA:  Acute renal failure EXAM: RENAL / URINARY TRACT ULTRASOUND COMPLETE COMPARISON:  None. FINDINGS: Right Kidney: Length: 12.9 cm. Echogenic renal parenchyma. Trace perinephric fluid. Mild hydronephrosis. Left Kidney: Length: 12.9 cm. Echogenic renal parenchyma. 2.3 x 1.7 x 2.0 cm lower pole simple cyst. Mild hydronephrosis. Bladder: Within normal limits. IMPRESSION: Mild hydronephrosis bilaterally. 2.3 cm left lower pole simple cyst. Echogenic renal parenchyma, suggesting medical renal disease. Electronically Signed   By: Julian Hy M.D.   On: 11/30/2015 17:06     ASSESSMENT AND PLAN:    65 year old female with history of essential hypertension and tobacco abuse who presents with weakness over the past several days and found to have atrial fibrillation and RVR and sepsis from urinary tract infection.  1. Sepsis: Patient presented with leukocytosis, fever, tachycardia. Sepsis is secondary UTI/cellulitis Urine culture 80,000 gram-negative rods. ID and susceptibilities to follow Blood cultures are negative today.  Continue Zosyn. Elevated leg ID consult for right lower extremity cellulitis. Lower extreme he Doppler negative for DVT  2. Acute Non-ST elevation MI with peak troponin 6.47: Continue heparin drip, metoprolol atorvastatin, and aspirin. Echocardiogram shows mild diastolic dysfunction with EF of 50% As per cardiology due to sepsis and acute kidney injury no indication for cardiac catheterization at this time  3. Atrial fibrillation with RVR: Patient has now converted to sinus tachycardia. Continue heparin drip, PO amiodarone and metoprolol. Follow up  on echocardiogram. TSH is low, but T3 and T4  normal Suggest repeating TFTs in 4 weeks.  4. Acute kidney injury due to ATN from atrial fibrillation, sepsis and non-ST elevation MI. Creatinine is improving with IV fluids. Patient has adequate urine output. Repeat BMP in a.m.  5. Acute hypoxic respiratory failure: Chest x-ray is not convincing for pneumonia or CHF. Order VQ scan to evaluate for PE.  6. Glaucoma: Continue eyedrops. Management plans discussed with the patient and she is in agreement.  CODE STATUS: FULL  TOTAL TIME TAKING CARE OF THIS PATIENT: 28 minutes.     POSSIBLE D/C 2-3 days, DEPENDING ON CLINICAL CONDITION.   Emera Bussie M.D on 12/01/2015 at 12:04 PM  Between 7am to 6pm - Pager - [phone] After 6pm go to [email or web address] - password [email or web address] [facility]  Office  [phone]  CC: Primary care physician; Madelyn Brunner, MD  Note: This dictation was prepared with Dragon dictation along with smaller phrase technology. Any transcriptional errors that result from this process are unintentional.

## 2015-12-01 NOTE — Care Management (Signed)
Have made several attempts to screen patient and experiencing dyspnea and on hfnc.  Asks for CM to come back later.  When attempted again patient had visitors.

## 2015-12-01 NOTE — Progress Notes (Signed)
ANTICOAGULATION CONSULT NOTE - Initial Consult  Pharmacy Consult for heparin drip Indication: atrial fibrillation  Allergies  Allergen Reactions  . Contrast Media [Iodinated Diagnostic Agents] Other (See Comments)    Flushed, cough, anxiety, mild less responsiveness.   Patient Measurements: Height: 5\' 6"  (167.6 cm) Weight: 219 lb 4.8 oz (99.474 kg) IBW/kg (Calculated) : 59.3 Heparin Dosing Weight: 81.1 kg  Vital Signs: Temp: 97.9 F (36.6 C) (05/03 0533) Temp Source: Oral (05/03 0533) BP: 177/86 mmHg (05/03 0533) Pulse Rate: 95 (05/03 0533)  Labs:  Recent Labs  11/29/15 0937 11/29/15 1935 11/29/15 1946 11/29/15 2313 11/30/15 0200 11/30/15 0538 12/01/15 0505  HGB 13.5  --   --   --   --  11.3* 10.4*  HCT 41.0  --   --   --   --  34.5* 31.7*  PLT 140*  --   --   --   --  117* 120*  APTT 33  --   --   --   --   --   --   LABPROT 17.9*  --   --   --   --   --   --   INR 1.47  --   --   --   --   --   --   HEPARINUNFRC  --   --  0.43  --  0.34  --  0.47  CREATININE 2.01*  --   --   --   --  1.86* 1.67*  TROPONINI 5.24* 6.47*  --  6.01*  --  4.98*  --    Estimated Creatinine Clearance: 40 mL/min (by C-G formula based on Cr of 1.67).  Medical History: Past Medical History  Diagnosis Date  . Hypertension   . Diabetes mellitus     diagnosed at age 59, on a "pill" (?metformin), but now diet-controlled  . Glaucoma   . Hyperlipidemia     managed with lifestyle modification  . Tobacco abuse     No PFT's or diagnosis of COPD   Assessment: Pharmacy consulted to dose and monitor heparin drip in this 65 year old female for new onset atrial fibrillation. Patient was not taking any anticoagulants prior to admission per med rec. Baseline labs were obtained in the ED.  Hgb 13.5 Plt 140  INR 1.47 Troponin 5.24  APTT 33  Goal of Therapy:  Heparin level 0.3-0.7 units/ml Monitor platelets by anticoagulation protocol: Yes   Plan:  Give 4050 units bolus x 1 (~50  units/kg) Start heparin infusion at 1100 units/hr (~14 units/kg/hr) Check anti-Xa level in 6 hours and daily while on heparin Continue to monitor H&H and platelets  5/01:  HL @ 19:46 = 0.43.  Will continue this pt on current rate of 1100 units/hr and recheck HL on 5/2 @ 2:00.   5/2 02:00 heparin level 0.34. Continue current regimen. Recheck heparin level and CBC with tomorrow AM labs.  5/3 AM heparin level 0.47. Continue current regimen. Recheck heparin level and CBC with tomorrow AM labs.   Thank you for the consult.  Eloise Harman, PharmD Clinical Pharmacist 12/01/2015,6:50 AM

## 2015-12-01 NOTE — Progress Notes (Addendum)
ELECTROLYTE CONSULT NOTE - Follow Up  Pharmacy Consult for electrolyte monitoring and replacement Indication: hypokalemia  Allergies  Allergen Reactions  . Contrast Media [Iodinated Diagnostic Agents] Other (See Comments)    Flushed, cough, anxiety, mild less responsiveness.   Patient Measurements: Height: 5\' 6"  (167.6 cm) Weight: 219 lb 4.8 oz (99.474 kg) IBW/kg (Calculated) : 59.3  Vital Signs: Temp: 97.9 F (36.6 C) (05/03 0533) Temp Source: Oral (05/03 0533) BP: 177/86 mmHg (05/03 0533) Pulse Rate: 95 (05/03 0533) Intake/Output from previous day: 05/02 0701 - 05/03 0700 In: 999.3 [P.O.:120; I.V.:779.3; IV Piggyback:100] Out: 1875 [Urine:1875] Intake/Output from this shift:    Estimated Creatinine Clearance: 40 mL/min (by C-G formula based on Cr of 1.67).  Medical History: Past Medical History  Diagnosis Date  . Hypertension   . Diabetes mellitus     diagnosed at age 67, on a "pill" (?metformin), but now diet-controlled  . Glaucoma   . Hyperlipidemia     managed with lifestyle modification  . Tobacco abuse     No PFT's or diagnosis of COPD   Assessment: Pharmacy consulted to monitor and replace electrolytes if needed in this 65 year old female admitted with sepsis and new onset atrial fibrillation. Potassium of 3.2 was low on admission. Have ordered an add-on Mg. Patient was taking potassium chloride 20 mEq daily prior to admission which will be resumed.   Orders for KCl 20 meq po daily to start today and patient received KCl 40 meq po once on 5/2.  K: 3.2, Mag: 2.0, Phos: 2.9  Goal of Therapy:  Electrolytes within normal limits  Plan:  Will order KCl 40 meq po once and continue with orders for KCl 20 meq po daily to administer a total of 60 meq for today.  Will order a follow potassium check for 1800 today to further assess need for supplementation.  Will recheck BMP in AM.  Magnesium and Phosphorus have been stable the last 2 days so will not recheck these  lab values.    Loleta Dicker, PharmD Clinical Pharmacist 12/01/2015,7:30 AM

## 2015-12-01 NOTE — Progress Notes (Signed)
Physical Therapy Treatment Patient Details Name: Patricia Ewing MRN: [id number] DOB: [birth year] Today's Date: 12/01/2015    History of Present Illness Pt is a 65 y.o. F admitted to hospital for sepsis, A-fib and acute NSTEMI. Pt has hx of HTN, DM, glaucoma, and hyperlipidemia.     PT Comments    Pt is progressing towards goals, however is limited by R LE pain and swelling. Pt able to perform transfer from EOB with mod A. Attempted to ambulate with pt, however had to stop within 2 ft d/t patient's pain in R LE. Pt performed seated and standing there-ex with no assist. Pts O2 remained between 96-100 during all exercise and mobility. Pt demonstrates deficits in mobility. Pt would benefit from further PT to address deficits; recommend pt receive home health PT after discharge from acute hospitalization.   Follow Up Recommendations  Home health PT     Equipment Recommendations       Recommendations for Other Services       Precautions / Restrictions Precautions Precautions: Fall Restrictions Weight Bearing Restrictions: No    Mobility  Bed Mobility               General bed mobility comments: Pt sitting on EOB at start of treatment.   Transfers Overall transfer level: Needs assistance Equipment used: None Transfers: Sit to/from Stand Sit to Stand: Mod assist         General transfer comment: Pt able to transfer from EOB using mod assist. Pt required increased time to rise from bed and upright posture. Pt stated experiencing increased pain in R LE when standing.   Ambulation/Gait Ambulation/Gait assistance: Mod assist Ambulation Distance (Feet): 2 Feet Assistive device: None Gait Pattern/deviations: Step-to pattern Gait velocity: slow   General Gait Details: Pt only able to ambulate approx 2 ft at EOB with mod assist. Upon start of ambulation, chaplain came into room to talk with pt. Pt became distracted and stated increase in pain in R LE. Pt sat down on EOB prior  to instruction.    Stairs            Wheelchair Mobility    Modified Rankin (Stroke Patients Only)       Balance                                    Cognition Arousal/Alertness: Awake/alert Behavior During Therapy: WFL for tasks assessed/performed Overall Cognitive Status: Within Functional Limits for tasks assessed                      Exercises Other Exercises Other Exercises: Pt performed seated ther-ex on EOB on B LE including LAQ and ankle pumps for 2 sets of 12 reps with no assist. Pt performed seated marching x10 reps. Pt peformed standing mini squats 2 sets of 10 reps with assist from PT and use of bed railing. Pt demonstrated compensation with trunk during seated ther-ex.     General Comments        Pertinent Vitals/Pain Pain Assessment: Faces Faces Pain Scale: Hurts even more Pain Location: R LE  Pain Descriptors / Indicators: Grimacing Pain Intervention(s): Limited activity within patient's tolerance    Home Living                      Prior Function            PT Goals (  current goals can now be found in the care plan section) Acute Rehab PT Goals Patient Stated Goal: to return to PLOF PT Goal Formulation: With patient Time For Goal Achievement: 12/14/15 Potential to Achieve Goals: Fair Progress towards PT goals: Progressing toward goals    Frequency  Min 2X/week    PT Plan Current plan remains appropriate    Co-evaluation             End of Session Equipment Utilized During Treatment: Gait belt;Oxygen Activity Tolerance: Patient limited by pain Patient left: in bed;with call bell/phone within reach;with bed alarm set     Time: [id number] PT Time Calculation (min) (ACUTE ONLY): 28 min  Charges:                       G Codes:      Sherral Hammers Dec 27, 2015, 11:35 AM M. Barnett Abu, SPT

## 2015-12-01 NOTE — Progress Notes (Signed)
[facility]  ROUNDING NOTE   Subjective:  Renal function has improved. Creatinine down to 1.6. Still has considerable cellulitis and edema in the right lower extremity.   Objective:  Vital signs in last 24 hours:  Temp:  [97.8 F (36.6 C)-102 F (38.9 C)] 97.8 F (36.6 C) (05/03 1140) Pulse Rate:  [73-111] 73 (05/03 1140) Resp:  [15-20] 20 (05/03 1140) BP: (152-177)/(75-109) 152/87 mmHg (05/03 1140) SpO2:  [96 %-100 %] 96 % (05/03 1538) FiO2 (%):  [35 %-40 %] 36 % (05/03 1538)  Weight change:  Filed Weights   11/29/15 0935 11/29/15 1725  Weight: 97.523 kg (215 lb) 99.474 kg (219 lb 4.8 oz)    Intake/Output: I/O last 3 completed shifts: In: 999.3 [P.O.:120; I.V.:779.3; IV Piggyback:100] Out: [id number] [Urine:2425]   Intake/Output this shift:  Total I/O In: 700 [P.O.:600; IV Piggyback:100] Out: 575 [Urine:575]  Physical Exam: General: NAD, resting in bed  Head: Normocephalic, atraumatic. Moist oral mucosal membranes  Eyes: Anicteric  Neck: Supple, trachea midline  Lungs:  Clear to auscultation normal effort  Heart: Regular rate and rhythm no rubs  Abdomen:  Soft, nontender, BS present  Extremities: Extensive cellulitis of the RLE, multiple excoriations of RLE  Neurologic: Nonfocal, moving all four extremities  Skin: Excoriations b/l LE's, cellulitis of RLE       Basic Metabolic Panel:  Recent Labs Lab 11/29/15 0937 11/29/15 1946 11/30/15 0538 12/01/15 0505  NA 144  --  141 141  K 3.2*  --  3.4* 3.2*  CL 104  --  108 107  CO2 23  --  24 26  GLUCOSE 124*  --  135* 189*  BUN 40*  --  40* 39*  CREATININE 2.01*  --  1.86* 1.67*  CALCIUM 9.2  --  8.4* 8.7*  MG  --  2.0 2.0 2.0  PHOS  --   --  3.4 2.9    Liver Function Tests:  Recent Labs Lab 11/29/15 0937  AST 80*  ALT 24  ALKPHOS 64  BILITOT 1.1  PROT 8.1  ALBUMIN 3.8    Recent Labs Lab 11/29/15 0937  LIPASE 12   No results for input(s): AMMONIA in the last 168  hours.  CBC:  Recent Labs Lab 11/29/15 0937 11/30/15 0538 12/01/15 0505  WBC 16.8* 11.8* 14.9*  NEUTROABS 16.3* 11.0*  --   HGB 13.5 11.3* 10.4*  HCT 41.0 34.5* 31.7*  MCV 90.0 92.1 89.6  PLT 140* 117* 120*    Cardiac Enzymes:  Recent Labs Lab 11/29/15 0937 11/29/15 1935 11/29/15 2313 11/30/15 0538  TROPONINI 5.24* 6.47* 6.01* 4.98*    BNP: Invalid input(s): POCBNP  CBG: No results for input(s): GLUCAP in the last 168 hours.  Microbiology: Results for orders placed or performed during the hospital encounter of 11/29/15  Blood Culture (routine x 2)     Status: None (Preliminary result)   Collection Time: 11/29/15  9:42 AM  Result Value Ref Range Status   Specimen Description BLOOD LEFT AC  Final   Special Requests   Final    BOTTLES DRAWN AEROBIC AND ANAEROBIC AER 4ML ANA 2ML   Culture NO GROWTH 2 DAYS  Final   Report Status PENDING  Incomplete  Blood Culture (routine x 2)     Status: None (Preliminary result)   Collection Time: 11/29/15  9:44 AM  Result Value Ref Range Status   Specimen Description BLOOD RIGHT [facility]  Final   Special Requests   Final  BOTTLES DRAWN AEROBIC AND ANAEROBIC ANA 4ML AER 2ML   Culture NO GROWTH 2 DAYS  Final   Report Status PENDING  Incomplete  Urine culture     Status: Abnormal   Collection Time: 11/29/15 10:00 AM  Result Value Ref Range Status   Specimen Description URINE, RANDOM  Final   Special Requests NONE  Final   Culture 80,000 COLONIES/mL ESCHERICHIA COLI (A)  Final   Report Status 12/01/2015 FINAL  Final   Organism ID, Bacteria ESCHERICHIA COLI (A)  Final      Susceptibility   Escherichia coli - MIC*    AMPICILLIN <=2 SENSITIVE Sensitive     CEFAZOLIN <=4 SENSITIVE Sensitive     CEFTRIAXONE <=1 SENSITIVE Sensitive     CIPROFLOXACIN <=0.25 SENSITIVE Sensitive     GENTAMICIN <=1 SENSITIVE Sensitive     IMIPENEM <=0.25 SENSITIVE Sensitive     NITROFURANTOIN <=16 SENSITIVE Sensitive     TRIMETH/SULFA <=20 SENSITIVE  Sensitive     AMPICILLIN/SULBACTAM <=2 SENSITIVE Sensitive     PIP/TAZO <=4 SENSITIVE Sensitive     Extended ESBL NEGATIVE Sensitive     * 80,000 COLONIES/mL ESCHERICHIA COLI    Coagulation Studies:  Recent Labs  11/29/15 0937  LABPROT 17.9*  INR 1.47    Urinalysis:  Recent Labs  11/29/15 1000  COLORURINE YELLOW*  LABSPEC 1.014  PHURINE 5.0  GLUCOSEU NEGATIVE  HGBUR 3+*  BILIRUBINUR NEGATIVE  KETONESUR TRACE*  PROTEINUR 100*  NITRITE POSITIVE*  LEUKOCYTESUR TRACE*      Imaging: Dg Chest 1 View  12/01/2015  CLINICAL DATA:  Hypoxia.  Sepsis. EXAM: CHEST 1 VIEW COMPARISON:  11/30/2015 FINDINGS: Moderate cardiomegaly stable. Both lungs are clear. Previously seen right infrahilar opacity is no longer visualized on today's exam. No evidence of pleural effusion or pneumothorax. IMPRESSION: Cardiomegaly.  No active lung disease seen on today's exam. Electronically Signed   By: Earle Gell M.D.   On: 12/01/2015 12:05   Dg Chest 1 View  11/30/2015  CLINICAL DATA:  Shortness of breath. EXAM: CHEST 1 VIEW COMPARISON:  Nov 29, 2015. FINDINGS: Stable cardiomegaly. No pneumothorax or pleural effusion is noted. Left lung is clear. New mild right infrahilar opacity is noted concerning for edema or inflammation. Bony thorax is unremarkable. IMPRESSION: New mild right infrahilar opacity is noted concerning for edema or inflammation. Electronically Signed   By: Marijo Conception, M.D.   On: 11/30/2015 14:22   US Renal  11/30/2015  CLINICAL DATA:  Acute renal failure EXAM: RENAL / URINARY TRACT ULTRASOUND COMPLETE COMPARISON:  None. FINDINGS: Right Kidney: Length: 12.9 cm. Echogenic renal parenchyma. Trace perinephric fluid. Mild hydronephrosis. Left Kidney: Length: 12.9 cm. Echogenic renal parenchyma. 2.3 x 1.7 x 2.0 cm lower pole simple cyst. Mild hydronephrosis. Bladder: Within normal limits. IMPRESSION: Mild hydronephrosis bilaterally. 2.3 cm left lower pole simple cyst. Echogenic renal  parenchyma, suggesting medical renal disease. Electronically Signed   By: Julian Hy M.D.   On: 11/30/2015 17:06     Medications:   . heparin 1,100 Units/hr (12/01/15 0725)   . amiodarone  200 mg Oral Daily  . aspirin  81 mg Oral Daily  . atorvastatin  40 mg Oral q1800  . brimonidine  1 drop Both Eyes BID  . ipratropium-albuterol  3 mL Nebulization Q4H  . latanoprost  1 drop Both Eyes QHS  . methylPREDNISolone (SOLU-MEDROL) injection  40 mg Intravenous Q8H  . metoprolol tartrate  50 mg Oral BID  . piperacillin-tazobactam (ZOSYN)  IV  3.375 g Intravenous Q8H  . [START ON 12/02/2015] potassium chloride  40 mEq Oral Daily   acetaminophen **OR** acetaminophen, albuterol, nitroGLYCERIN, ondansetron **OR** ondansetron (ZOFRAN) IV, oxyCODONE  Assessment/ Plan:  65 y.o. female with a PMHX of with a PMHx of hypertension, diabetes mellitus type 2, glaucoma, hyperlipidemia, history of tobacco abuse, who was admitted to [facility] on 11/29/2015 for evaluation of Cellulitis of the right lower extremity as well as malaise. We were asked to see her for evaluation management of acute renal failure in this setting. The patient's baseline creatinine appears to be 1.2.   1. Acute renal failure/chronic kidney disease stage III baseline creatinine 1.2/diabetes mellitus type 2 with chronic kidney disease/proteinuria. Acute renal failure secondary to concurrent infection as well as use of Aleve and BC powder. - there was mild hydronephrosis noted bilaterally.  We will obtainCT scan of the abdomen and pelvis without contrast for further evaluation of this.  Depending upon results of this we may need to consider urology consultation as well.  Continue to monitor renal parameters daily.  2. Right lower extremity cellulitis. patient onZosyn at the moment.  Continue this.  3. Hypertension.  Blood pressure currently 152/87.  Continue metoprolol.  Ideally we would like to add an ACE inhibitor or ARB but we'll  avoid this given acute renal failure for now.  4.  Hypokalemia.  Serum potassium low at 3.2.  Patient given potassium chloride 40 mg by mouth x1 today.  Followup serum potassium tomorrow.   LOS: 2 Porter Moes 5/3/20174:44 PM

## 2015-12-01 NOTE — Progress Notes (Signed)
  SUBJECTIVE: The patient is sitting up on the side of the bed eating breakfast. She denies any chest pain or tightness.   Filed Vitals:   11/30/15 2123 12/01/15 0441 12/01/15 0533 12/01/15 0735  BP: 161/98  177/86   Pulse: 88  95   Temp: 99.1 F (37.3 C)  97.9 F (36.6 C)   TempSrc: Oral  Oral   Resp:   15   Height:      Weight:      SpO2:  97% 100% 100%    Intake/Output Summary (Last 24 hours) at 12/01/15 0903 Last data filed at 12/01/15 0800  Gross per 24 hour  Intake  799.3 ml  Output   1675 ml  Net -875.7 ml    LABS: Basic Metabolic Panel:  Recent Labs  11/30/15 0538 12/01/15 0505  NA 141 141  K 3.4* 3.2*  CL 108 107  CO2 24 26  GLUCOSE 135* 189*  BUN 40* 39*  CREATININE 1.86* 1.67*  CALCIUM 8.4* 8.7*  MG 2.0 2.0  PHOS 3.4 2.9   Liver Function Tests:  Recent Labs  11/29/15 0937  AST 80*  ALT 24  ALKPHOS 64  BILITOT 1.1  PROT 8.1  ALBUMIN 3.8    Recent Labs  11/29/15 0937  LIPASE 12   CBC:  Recent Labs  11/29/15 0937 11/30/15 0538 12/01/15 0505  WBC 16.8* 11.8* 14.9*  NEUTROABS 16.3* 11.0*  --   HGB 13.5 11.3* 10.4*  HCT 41.0 34.5* 31.7*  MCV 90.0 92.1 89.6  PLT 140* 117* 120*   Cardiac Enzymes:  Recent Labs  11/29/15 1935 11/29/15 2313 11/30/15 0538  TROPONINI 6.47* 6.01* 4.98*   BNP: Invalid input(s): POCBNP D-Dimer: No results for input(s): DDIMER in the last 72 hours. Hemoglobin A1C: No results for input(s): HGBA1C in the last 72 hours. Fasting Lipid Panel: No results for input(s): CHOL, HDL, LDLCALC, TRIG, CHOLHDL, LDLDIRECT in the last 72 hours. Thyroid Function Tests:  Recent Labs  11/29/15 0937  TSH 0.284*   Anemia Panel: No results for input(s): VITAMINB12, FOLATE, FERRITIN, TIBC, IRON, RETICCTPCT in the last 72 hours.   PHYSICAL EXAM General: Well developed, well nourished, in no acute distress HEENT:  Normocephalic and atramatic Neck:  No JVD.  Lungs: Clear bilaterally to auscultation and  percussion. Heart: HRRR . Normal S1 and S2 without gallops or murmurs.  Abdomen: Bowel sounds are positive, abdomen soft and non-tender  Msk:  Back normal, normal gait. Normal strength and tone for age. Extremities: Lower extremity edema and redness, right worse than left Neuro: Alert and oriented X 3. Psych:  Good affect, responds appropriately  TELEMETRY: Sinus rhythm with a rate of 82 bpm  ASSESSMENT AND PLAN: Atrial fibrillation with rapid ventricular response which responded very well to amiodarone which is now being given orally. The patient is anticoagulated with heparin at this time. Elevated troponin with peak troponin of 6 and severe cellulitis of the right leg and renal insufficiency indicate that patient has not a candidate for catheterization at this time.  Active Problems:   Sepsis ([place])    Daune Perch, NP 12/01/2015 9:03 AM

## 2015-12-01 NOTE — Consult Note (Signed)
[facility] Infectious Disease     Reason for Consult:Cellulitis, sepsis    Referring Physician: Bettey Costa Date of Admission:  11/29/2015   Active Problems:   Sepsis [facility])   HPI: Patricia Ewing is a 65 y.o. female admitted 5/1 with weakness, dizziness, fever and leg swelling.  She has hx of hypertension, obesity, tobacco abuse, asthma .  Had reported RLE redness for 1 month. On admit was in Afib with RVR, lactate 2.6, wbc 16.8, ARF with cr 2.0 and febrile to 103.  UCX + ecoli, UA with 6-30 wbc, BCX neg Doppler RLE negative Has been on vanco and zosyn.  She denies any hx of trauma, blood clot or prior infection on the R leg but does report chronic bil le edema   Past Medical History  Diagnosis Date  . Hypertension   . Diabetes mellitus     diagnosed at age 45, on a "pill" (?metformin), but now diet-controlled  . Glaucoma   . Hyperlipidemia     managed with lifestyle modification  . Tobacco abuse     No PFT's or diagnosis of COPD   Past Surgical History  Procedure Laterality Date  . Abdominal hysterectomy  1984   Social History  Substance Use Topics  . Smoking status: Current Every Day Smoker -- 0.50 packs/day for 40 years    Types: Cigarettes  . Smokeless tobacco: None  . Alcohol Use: 0.5 - 1.0 oz/week    1-2 drink(s) per week   Family History  Problem Relation Age of Onset  . Heart attack Father 43  . Lymphoma Son   . Colon cancer Other     Allergies:  Allergies  Allergen Reactions  . Contrast Media [Iodinated Diagnostic Agents] Other (See Comments)    Flushed, cough, anxiety, mild less responsiveness.    Current antibiotics: Antibiotics Given (last 72 hours)    Date/Time Action Medication Dose Rate   11/29/15 2032 Given   piperacillin-tazobactam (ZOSYN) IVPB 3.375 g 3.375 g 12.5 mL/hr   11/30/15 0533 Given   piperacillin-tazobactam (ZOSYN) IVPB 3.375 g 3.375 g 12.5 mL/hr   11/30/15 1314 Given   piperacillin-tazobactam (ZOSYN) IVPB 3.375 g 3.375 g 12.5  mL/hr   11/30/15 2002 Given  [per pt request]   piperacillin-tazobactam (ZOSYN) IVPB 3.375 g 3.375 g 12.5 mL/hr   12/01/15 0540 Given   piperacillin-tazobactam (ZOSYN) IVPB 3.375 g 3.375 g 12.5 mL/hr   12/01/15 1433 Given   piperacillin-tazobactam (ZOSYN) IVPB 3.375 g 3.375 g 12.5 mL/hr      MEDICATIONS: . amiodarone  200 mg Oral Daily  . aspirin  81 mg Oral Daily  . atorvastatin  40 mg Oral q1800  . brimonidine  1 drop Both Eyes BID  . ipratropium-albuterol  3 mL Nebulization Q4H  . latanoprost  1 drop Both Eyes QHS  . methylPREDNISolone (SOLU-MEDROL) injection  40 mg Intravenous Q8H  . metoprolol tartrate  50 mg Oral BID  . piperacillin-tazobactam (ZOSYN)  IV  3.375 g Intravenous Q8H  . [START ON 12/02/2015] potassium chloride  40 mEq Oral Daily    Review of Systems - 11 systems reviewed and negative per HPI   OBJECTIVE: Temp:  [97.8 F (36.6 C)-102 F (38.9 C)] 97.8 F (36.6 C) (05/03 1140) Pulse Rate:  [73-111] 73 (05/03 1140) Resp:  [15-20] 20 (05/03 1140) BP: (152-177)/(75-109) 152/87 mmHg (05/03 1140) SpO2:  [96 %-100 %] 96 % (05/03 1538) FiO2 (%):  [35 %-40 %] 36 % (05/03 1538) Physical Exam  Constitutional:  oriented to person, place, and time. appears well-developed and well-nourished. No distress. obese HENT: [place]/AT, PERRLA, no scleral icterus Mouth/Throat: Oropharynx is clear and moist. No oropharyngeal exudate.  Cardiovascular: Normal rate, regular rhythm and normal heart sounds.  Pulmonary/Chest: Effort normal and breath sounds normal. No respiratory distress.  has no wheezes.  Neck = supple, no nuchal rigidity Abdominal: Soft. Bowel sounds are normal.  exhibits no distension. There is no tenderness.  Lymphadenopathy: no cervical adenopathy. No axillary adenopathy Neurological: alert and oriented to person, place, and time.  Skin: RLE with chronic appearing brawny edema and erythema Ext 3+ RLE edema  Psychiatric: a normal mood and affect.  behavior is  normal.    LABS: Results for orders placed or performed during the hospital encounter of 11/29/15 (from the past 48 hour(s))  Lactic acid, plasma     Status: None   Collection Time: 11/29/15  7:35 PM  Result Value Ref Range   Lactic Acid, Venous 1.5 0.5 - 2.0 mmol/L  Troponin I     Status: Abnormal   Collection Time: 11/29/15  7:35 PM  Result Value Ref Range   Troponin I 6.47 (H) <0.031 ng/mL    Comment: PREVIOUS RESULT CALLED AT 1144  11/29/15 BY SGD.Marland KitchenMarland KitchenSDR        POSSIBLE MYOCARDIAL ISCHEMIA. SERIAL TESTING RECOMMENDED.   Heparin level (unfractionated)     Status: None   Collection Time: 11/29/15  7:46 PM  Result Value Ref Range   Heparin Unfractionated 0.43 0.30 - 0.70 IU/mL    Comment:        IF HEPARIN RESULTS ARE BELOW EXPECTED VALUES, AND PATIENT DOSAGE HAS BEEN CONFIRMED, SUGGEST FOLLOW UP TESTING OF ANTITHROMBIN III LEVELS.   Magnesium     Status: None   Collection Time: 11/29/15  7:46 PM  Result Value Ref Range   Magnesium 2.0 1.7 - 2.4 mg/dL  Troponin I     Status: Abnormal   Collection Time: 11/29/15 11:13 PM  Result Value Ref Range   Troponin I 6.01 (H) <0.031 ng/mL    Comment: PREVIOUS RESULT CALLED TO GREG MOYER ON 11/29/15 AT 1114 BY SGD/TLB        POSSIBLE MYOCARDIAL ISCHEMIA. SERIAL TESTING RECOMMENDED.   Heparin level (unfractionated)     Status: None   Collection Time: 11/30/15  2:00 AM  Result Value Ref Range   Heparin Unfractionated 0.34 0.30 - 0.70 IU/mL    Comment:        IF HEPARIN RESULTS ARE BELOW EXPECTED VALUES, AND PATIENT DOSAGE HAS BEEN CONFIRMED, SUGGEST FOLLOW UP TESTING OF ANTITHROMBIN III LEVELS.   CBC with Differential/Platelet     Status: Abnormal   Collection Time: 11/30/15  5:38 AM  Result Value Ref Range   WBC 11.8 (H) 3.6 - 11.0 K/uL   RBC 3.75 (L) 3.80 - 5.20 MIL/uL   Hemoglobin 11.3 (L) 12.0 - 16.0 g/dL   HCT 34.5 (L) 35.0 - 47.0 %   MCV 92.1 80.0 - 100.0 fL   MCH 30.2 26.0 - 34.0 pg   MCHC 32.8 32.0 - 36.0 g/dL    RDW 15.2 (H) 11.5 - 14.5 %   Platelets 117 (L) 150 - 440 K/uL   Neutrophils Relative % 94 %   Lymphocytes Relative 3 %   Monocytes Relative 3 %   Eosinophils Relative 0 %   Basophils Relative 0 %   Neutro Abs 11.0 (H) 1.4 - 6.5 K/uL   Lymphs Abs 0.4 (L) 1.0 - 3.6 K/uL  Monocytes Absolute 0.4 0.2 - 0.9 K/uL   Eosinophils Absolute 0.0 0 - 0.7 K/uL   Basophils Absolute 0.0 0 - 0.1 K/uL   Smear Review MORPHOLOGY UNREMARKABLE   Basic metabolic panel     Status: Abnormal   Collection Time: 11/30/15  5:38 AM  Result Value Ref Range   Sodium 141 135 - 145 mmol/L   Potassium 3.4 (L) 3.5 - 5.1 mmol/L   Chloride 108 101 - 111 mmol/L   CO2 24 22 - 32 mmol/L   Glucose, Bld 135 (H) 65 - 99 mg/dL   BUN 40 (H) 6 - 20 mg/dL   Creatinine, Ser 1.86 (H) 0.44 - 1.00 mg/dL   Calcium 8.4 (L) 8.9 - 10.3 mg/dL   GFR calc non Af Amer 27 (L) >60 mL/min   GFR calc Af Amer 32 (L) >60 mL/min    Comment: (NOTE) The eGFR has been calculated using the CKD EPI equation. This calculation has not been validated in all clinical situations. eGFR's persistently <60 mL/min signify possible Chronic Kidney Disease.    Anion gap 9 5 - 15  Magnesium     Status: None   Collection Time: 11/30/15  5:38 AM  Result Value Ref Range   Magnesium 2.0 1.7 - 2.4 mg/dL  Phosphorus     Status: None   Collection Time: 11/30/15  5:38 AM  Result Value Ref Range   Phosphorus 3.4 2.5 - 4.6 mg/dL  Troponin I     Status: Abnormal   Collection Time: 11/30/15  5:38 AM  Result Value Ref Range   Troponin I 4.98 (H) <0.031 ng/mL    Comment: PREVIOUS RESULT CALLED '@1114'$  ON 11/29/15 BY SGD/HKP        POSSIBLE MYOCARDIAL ISCHEMIA. SERIAL TESTING RECOMMENDED.   T4, free     Status: None   Collection Time: 11/30/15  5:38 AM  Result Value Ref Range   Free T4 1.09 0.61 - 1.12 ng/dL  T3 uptake     Status: None   Collection Time: 11/30/15  5:38 AM  Result Value Ref Range   T3 Uptake Ratio 34 24 - 39 %    Comment: (NOTE) Performed  At: [facility] [address] [place], [place] [id number] Lindon Romp MD [id number]   Protein / creatinine ratio, urine     Status: Abnormal   Collection Time: 11/30/15  4:20 PM  Result Value Ref Range   Creatinine, Urine 31 mg/dL   Total Protein, Urine 45 mg/dL    Comment: NO NORMAL RANGE ESTABLISHED FOR THIS TEST   Protein Creatinine Ratio 1.45 (H) 0.00 - 0.15 mg/mg[Cre]  Mpo/pr-3 (anca) antibodies     Status: None   Collection Time: 11/30/15  4:50 PM  Result Value Ref Range   Myeloperoxidase Abs <9.0 0.0 - 9.0 U/mL   ANCA Proteinase 3 <3.5 0.0 - 3.5 U/mL    Comment: (NOTE) Performed At: [facility] [address] [place], [place] [id number] Lindon Romp MD [id number]   Parathyroid hormone, intact (no Ca)     Status: Abnormal   Collection Time: 11/30/15  4:50 PM  Result Value Ref Range   PTH 73 (H) 15 - 65 pg/mL    Comment: (NOTE) Performed At: [facility] [place], [place] [id number] Lindon Romp MD [id number]   C3 complement     Status: None   Collection Time: 11/30/15  4:50 PM  Result Value Ref Range   C3 Complement 129 82 - 167  mg/dL    Comment: (NOTE) Performed At: [facility] [place], [place] [id number] Lindon Romp MD [id number]   C4 complement     Status: None   Collection Time: 11/30/15  4:50 PM  Result Value Ref Range   Complement C4, Body Fluid 19 14 - 44 mg/dL    Comment: (NOTE) Performed At: [facility] [place], [place] [id number] Lindon Romp MD [id number]   Glomerular basement membrane antibodies     Status: None   Collection Time: 11/30/15  4:50 PM  Result Value Ref Range   GBM Ab 3 0 - 20 units    Comment: (NOTE)                   Negative                   0 - 20                   Weak Positive             21 - 30                   Moderate to Strong Positive   >30 Performed At: [facility] [place], [place] [id number] Lindon Romp MD [id number]   Basic metabolic panel     Status: Abnormal   Collection Time: 12/01/15  5:05 AM  Result Value Ref Range   Sodium 141 135 - 145 mmol/L   Potassium 3.2 (L) 3.5 - 5.1 mmol/L   Chloride 107 101 - 111 mmol/L   CO2 26 22 - 32 mmol/L   Glucose, Bld 189 (H) 65 - 99 mg/dL   BUN 39 (H) 6 - 20 mg/dL   Creatinine, Ser 1.67 (H) 0.44 - 1.00 mg/dL   Calcium 8.7 (L) 8.9 - 10.3 mg/dL   GFR calc non Af Amer 31 (L) >60 mL/min   GFR calc Af Amer 36 (L) >60 mL/min    Comment: (NOTE) The eGFR has been calculated using the CKD EPI equation. This calculation has not been validated in all clinical situations. eGFR's persistently <60 mL/min signify possible Chronic Kidney Disease.    Anion gap 8 5 - 15  Heparin level (unfractionated)     Status: None   Collection Time: 12/01/15  5:05 AM  Result Value Ref Range   Heparin Unfractionated 0.47 0.30 - 0.70 IU/mL    Comment:        IF HEPARIN RESULTS ARE BELOW EXPECTED VALUES, AND PATIENT DOSAGE HAS BEEN CONFIRMED, SUGGEST FOLLOW UP TESTING OF ANTITHROMBIN III LEVELS.   CBC     Status: Abnormal   Collection Time: 12/01/15  5:05 AM  Result Value Ref Range   WBC 14.9 (H) 3.6 - 11.0 K/uL   RBC 3.54 (L) 3.80 - 5.20 MIL/uL   Hemoglobin 10.4 (L) 12.0 - 16.0 g/dL   HCT 31.7 (L) 35.0 - 47.0 %   MCV 89.6 80.0 - 100.0 fL   MCH 29.6 26.0 - 34.0 pg   MCHC 33.0 32.0 - 36.0 g/dL   RDW 15.5 (H) 11.5 - 14.5 %   Platelets 120 (L) 150 - 440 K/uL  Magnesium     Status: None   Collection Time: 12/01/15  5:05 AM  Result Value Ref Range   Magnesium 2.0 1.7 - 2.4 mg/dL  Phosphorus     Status: None   Collection Time: 12/01/15  5:05  AM  Result Value Ref Range   Phosphorus 2.9 2.5 - 4.6 mg/dL   No components found for: ESR, C REACTIVE PROTEIN MICRO: Recent Results (from the past 720 hour(s))  Blood Culture (routine x 2)     Status: None (Preliminary result)   Collection Time: 11/29/15  9:42 AM  Result  Value Ref Range Status   Specimen Description BLOOD LEFT AC  Final   Special Requests   Final    BOTTLES DRAWN AEROBIC AND ANAEROBIC AER 4ML ANA 2ML   Culture NO GROWTH 2 DAYS  Final   Report Status PENDING  Incomplete  Blood Culture (routine x 2)     Status: None (Preliminary result)   Collection Time: 11/29/15  9:44 AM  Result Value Ref Range Status   Specimen Description BLOOD RIGHT AC  Final   Special Requests   Final    BOTTLES DRAWN AEROBIC AND ANAEROBIC ANA 4ML AER 2ML   Culture NO GROWTH 2 DAYS  Final   Report Status PENDING  Incomplete  Urine culture     Status: Abnormal   Collection Time: 11/29/15 10:00 AM  Result Value Ref Range Status   Specimen Description URINE, RANDOM  Final   Special Requests NONE  Final   Culture 80,000 COLONIES/mL ESCHERICHIA COLI (A)  Final   Report Status 12/01/2015 FINAL  Final   Organism ID, Bacteria ESCHERICHIA COLI (A)  Final      Susceptibility   Escherichia coli - MIC*    AMPICILLIN <=2 SENSITIVE Sensitive     CEFAZOLIN <=4 SENSITIVE Sensitive     CEFTRIAXONE <=1 SENSITIVE Sensitive     CIPROFLOXACIN <=0.25 SENSITIVE Sensitive     GENTAMICIN <=1 SENSITIVE Sensitive     IMIPENEM <=0.25 SENSITIVE Sensitive     NITROFURANTOIN <=16 SENSITIVE Sensitive     TRIMETH/SULFA <=20 SENSITIVE Sensitive     AMPICILLIN/SULBACTAM <=2 SENSITIVE Sensitive     PIP/TAZO <=4 SENSITIVE Sensitive     Extended ESBL NEGATIVE Sensitive     * 80,000 COLONIES/mL ESCHERICHIA COLI    IMAGING: Dg Chest 1 View  12/01/2015  CLINICAL DATA:  Hypoxia.  Sepsis. EXAM: CHEST 1 VIEW COMPARISON:  11/30/2015 FINDINGS: Moderate cardiomegaly stable. Both lungs are clear. Previously seen right infrahilar opacity is no longer visualized on today's exam. No evidence of pleural effusion or pneumothorax. IMPRESSION: Cardiomegaly.  No active lung disease seen on today's exam. Electronically Signed   By: Earle Gell M.D.   On: 12/01/2015 12:05   Dg Chest 1 View  11/30/2015  CLINICAL  DATA:  Shortness of breath. EXAM: CHEST 1 VIEW COMPARISON:  Nov 29, 2015. FINDINGS: Stable cardiomegaly. No pneumothorax or pleural effusion is noted. Left lung is clear. New mild right infrahilar opacity is noted concerning for edema or inflammation. Bony thorax is unremarkable. IMPRESSION: New mild right infrahilar opacity is noted concerning for edema or inflammation. Electronically Signed   By: Marijo Conception, M.D.   On: 11/30/2015 14:22   US Renal  11/30/2015  CLINICAL DATA:  Acute renal failure EXAM: RENAL / URINARY TRACT ULTRASOUND COMPLETE COMPARISON:  None. FINDINGS: Right Kidney: Length: 12.9 cm. Echogenic renal parenchyma. Trace perinephric fluid. Mild hydronephrosis. Left Kidney: Length: 12.9 cm. Echogenic renal parenchyma. 2.3 x 1.7 x 2.0 cm lower pole simple cyst. Mild hydronephrosis. Bladder: Within normal limits. IMPRESSION: Mild hydronephrosis bilaterally. 2.3 cm left lower pole simple cyst. Echogenic renal parenchyma, suggesting medical renal disease. Electronically Signed   By: Julian Hy M.D.   On:  11/30/2015 17:06   US Venous Img Lower Unilateral Right  11/29/2015  CLINICAL DATA:  Right lower extremity redness and tenderness for 1 month. No known injury. Initial encounter. EXAM: RIGHT LOWER EXTREMITY VENOUS DOPPLER ULTRASOUND TECHNIQUE: Gray-scale sonography with graded compression, as well as color Doppler and duplex ultrasound were performed to evaluate the lower extremity deep venous systems from the level of the common femoral vein and including the common femoral, femoral, profunda femoral, popliteal and calf veins including the posterior tibial, peroneal and gastrocnemius veins when visible. The superficial great saphenous vein was also interrogated. Spectral Doppler was utilized to evaluate flow at rest and with distal augmentation maneuvers in the common femoral, femoral and popliteal veins. COMPARISON:  None. FINDINGS: Contralateral Common Femoral Vein: Respiratory phasicity  is normal and symmetric with the symptomatic side. No evidence of thrombus. Normal compressibility. Common Femoral Vein: No evidence of thrombus. Normal compressibility, respiratory phasicity and response to augmentation. Saphenofemoral Junction: No evidence of thrombus. Normal compressibility and flow on color Doppler imaging. Profunda Femoral Vein: No evidence of thrombus. Normal compressibility and flow on color Doppler imaging. Femoral Vein: No evidence of thrombus. Normal compressibility, respiratory phasicity and response to augmentation. Popliteal Vein: No evidence of thrombus. Normal compressibility, respiratory phasicity and response to augmentation. Calf Veins: No evidence of thrombus. Normal compressibility and flow on color Doppler imaging. Superficial Great Saphenous Vein: No evidence of thrombus. Normal compressibility and flow on color Doppler imaging. Venous Reflux:  None. Other Findings:  None. IMPRESSION: No evidence of deep venous thrombosis. Electronically Signed   By: Inge Rise M.D.   On: 11/29/2015 10:44   Dg Chest Port 1 View  11/29/2015  CLINICAL DATA:  Dizziness, tachycardia for 2 days, right leg swelling EXAM: PORTABLE CHEST 1 VIEW COMPARISON:  08/24/2014 FINDINGS: Borderline cardiomegaly. No acute infiltrate or pleural effusion. No pulmonary edema. Mild elevation of the right hemidiaphragm. Mild degenerative changes thoracic spine. IMPRESSION: No active disease.  Borderline cardiomegaly. Electronically Signed   By: Lahoma Crocker M.D.   On: 11/29/2015 10:50    Assessment:   Patricia Ewing is a 65 y.o. female with chronic RLE edema now with RLE cellulitis and several small ulcers. Had fever initially but improving, wbc was 16 now 14.9. Has UCX with E coli, BCX neg. Doppler negative CT abd pelvis shows enlarged LN in R groin  And mild edema like changes in subq tissues of R groin.  Recommendations Can change zosyn to ancef Elevate leg on 6 pillows Can place unawrap tomorrow  if improving some.  Thank you very much for allowing me to participate in the care of this patient. Please call with questions.   Cheral Marker. Ola Spurr, MD

## 2015-12-01 NOTE — Care Management Important Message (Signed)
Important Message  Patient Details  Name: Patricia Ewing MRN: [id number] Date of Birth: 1950/09/08   Medicare Important Message Given:  Yes    [name] 12/01/2015, 2:17 PM

## 2015-12-01 NOTE — Progress Notes (Signed)
ELECTROLYTE CONSULT NOTE - Follow Up  Pharmacy Consult for electrolyte monitoring and replacement Indication: hypokalemia  Allergies  Allergen Reactions  . Contrast Media [Iodinated Diagnostic Agents] Other (See Comments)    Flushed, cough, anxiety, mild less responsiveness.   Patient Measurements: Height: 5\' 6"  (167.6 cm) Weight: 219 lb 4.8 oz (99.474 kg) IBW/kg (Calculated) : 59.3  Vital Signs: Temp: 98.8 F (37.1 C) (05/03 1942) Temp Source: Oral (05/03 1942) BP: 160/87 mmHg (05/03 1942) Pulse Rate: 91 (05/03 2135) Intake/Output from previous day: 05/02 0701 - 05/03 0700 In: 999.3 [P.O.:120; I.V.:779.3; IV Piggyback:100] Out: 1875 [Urine:1875] Intake/Output from this shift:    Estimated Creatinine Clearance: 40 mL/min (by C-G formula based on Cr of 1.67).  Medical History: Past Medical History  Diagnosis Date  . Hypertension   . Diabetes mellitus     diagnosed at age 62, on a "pill" (?metformin), but now diet-controlled  . Glaucoma   . Hyperlipidemia     managed with lifestyle modification  . Tobacco abuse     No PFT's or diagnosis of COPD   Assessment: Pharmacy consulted to monitor and replace electrolytes if needed in this 65 year old female admitted with sepsis and new onset atrial fibrillation. Potassium of 3.2 was low on admission. Have ordered an add-on Mg. Patient was taking potassium chloride 20 mEq daily prior to admission which will be resumed.   Orders for KCl 20 meq po daily to start today and patient received KCl 40 meq po once on 5/2.  K: 3.2, Mag: 2.0, Phos: 2.9  Goal of Therapy:  Electrolytes within normal limits  Plan:  Will order KCl 40 meq po once and continue with orders for KCl 20 meq po daily to administer a total of 60 meq for today.  Will order a follow potassium check for 1800 today to further assess need for supplementation.  Will recheck BMP in AM.  Magnesium and Phosphorus have been stable the last 2 days so will not recheck these  lab values.   5/3: Serum K @ 19:14 = 3.4 .   Will order KCl 20 mEq PO X 1 and recheck K on 5/4 with AM labs.    Patricia Ewing, PharmD Clinical Pharmacist 12/01/2015,9:59 PM

## 2015-12-02 DIAGNOSIS — J90 Pleural effusion, not elsewhere classified: Secondary | ICD-10-CM

## 2015-12-02 DIAGNOSIS — N179 Acute kidney failure, unspecified: Secondary | ICD-10-CM

## 2015-12-02 DIAGNOSIS — B9620 Unspecified Escherichia coli [E. coli] as the cause of diseases classified elsewhere: Secondary | ICD-10-CM

## 2015-12-02 DIAGNOSIS — R590 Localized enlarged lymph nodes: Secondary | ICD-10-CM

## 2015-12-02 DIAGNOSIS — E785 Hyperlipidemia, unspecified: Secondary | ICD-10-CM

## 2015-12-02 DIAGNOSIS — K5790 Diverticulosis of intestine, part unspecified, without perforation or abscess without bleeding: Secondary | ICD-10-CM

## 2015-12-02 DIAGNOSIS — E119 Type 2 diabetes mellitus without complications: Secondary | ICD-10-CM

## 2015-12-02 DIAGNOSIS — L03115 Cellulitis of right lower limb: Secondary | ICD-10-CM

## 2015-12-02 DIAGNOSIS — I129 Hypertensive chronic kidney disease with stage 1 through stage 4 chronic kidney disease, or unspecified chronic kidney disease: Secondary | ICD-10-CM

## 2015-12-02 DIAGNOSIS — E1122 Type 2 diabetes mellitus with diabetic chronic kidney disease: Secondary | ICD-10-CM

## 2015-12-02 DIAGNOSIS — F17210 Nicotine dependence, cigarettes, uncomplicated: Secondary | ICD-10-CM

## 2015-12-02 DIAGNOSIS — I480 Paroxysmal atrial fibrillation: Secondary | ICD-10-CM

## 2015-12-02 DIAGNOSIS — E279 Disorder of adrenal gland, unspecified: Secondary | ICD-10-CM

## 2015-12-02 DIAGNOSIS — Z7982 Long term (current) use of aspirin: Secondary | ICD-10-CM

## 2015-12-02 DIAGNOSIS — N390 Urinary tract infection, site not specified: Secondary | ICD-10-CM

## 2015-12-02 DIAGNOSIS — D696 Thrombocytopenia, unspecified: Secondary | ICD-10-CM

## 2015-12-02 DIAGNOSIS — Z79899 Other long term (current) drug therapy: Secondary | ICD-10-CM

## 2015-12-02 DIAGNOSIS — Q638 Other specified congenital malformations of kidney: Secondary | ICD-10-CM

## 2015-12-02 DIAGNOSIS — A419 Sepsis, unspecified organism: Principal | ICD-10-CM

## 2015-12-02 DIAGNOSIS — N183 Chronic kidney disease, stage 3 (moderate): Secondary | ICD-10-CM

## 2015-12-02 LAB — GLUCOSE, CAPILLARY: Glucose-Capillary: 157 mg/dL — ABNORMAL HIGH (ref 65–99)

## 2015-12-02 LAB — CBC
HCT: 31.4 % — ABNORMAL LOW (ref 35.0–47.0)
Hemoglobin: 10.3 g/dL — ABNORMAL LOW (ref 12.0–16.0)
MCH: 29.5 pg (ref 26.0–34.0)
MCHC: 32.8 g/dL (ref 32.0–36.0)
MCV: 90 fL (ref 80.0–100.0)
PLATELETS: 140 10*3/uL — AB (ref 150–440)
RBC: 3.5 MIL/uL — ABNORMAL LOW (ref 3.80–5.20)
RDW: 15.9 % — AB (ref 11.5–14.5)
WBC: 16.2 10*3/uL — ABNORMAL HIGH (ref 3.6–11.0)

## 2015-12-02 LAB — BASIC METABOLIC PANEL
Anion gap: 8 (ref 5–15)
BUN: 48 mg/dL — AB (ref 6–20)
CALCIUM: 8.6 mg/dL — AB (ref 8.9–10.3)
CO2: 23 mmol/L (ref 22–32)
CREATININE: 1.96 mg/dL — AB (ref 0.44–1.00)
Chloride: 105 mmol/L (ref 101–111)
GFR calc Af Amer: 30 mL/min — ABNORMAL LOW (ref >60)
GFR, EST NON AFRICAN AMERICAN: 26 mL/min — AB (ref >60)
GLUCOSE: 208 mg/dL — AB (ref 65–99)
Potassium: 3.9 mmol/L (ref 3.5–5.1)
SODIUM: 136 mmol/L (ref 135–145)

## 2015-12-02 LAB — HEPARIN LEVEL (UNFRACTIONATED): Heparin Unfractionated: 0.5 IU/mL (ref 0.30–0.70)

## 2015-12-02 MED ORDER — CEFAZOLIN SODIUM 1-5 GM-% IV SOLN
1.0000 g | Freq: Two times a day (BID) | INTRAVENOUS | Status: DC
  Administered 2015-12-02: 1 g via INTRAVENOUS
  Filled 2015-12-02 (×3): qty 50

## 2015-12-02 MED ORDER — PREDNISONE 50 MG PO TABS: 50.0000 mg | ORAL_TABLET | Freq: Every day | ORAL | Status: DC

## 2015-12-02 MED ORDER — CLONIDINE HCL 0.1 MG PO TABS
0.2000 mg | ORAL_TABLET | Freq: Every day | ORAL | Status: DC
  Administered 2015-12-02 – 2015-12-06 (×5): 0.2 mg via ORAL
  Filled 2015-12-02 (×6): qty 2

## 2015-12-02 MED ORDER — SODIUM CHLORIDE 0.9 % IV SOLN
INTRAVENOUS | Status: DC
  Administered 2015-12-02: 12:00:00 via INTRAVENOUS

## 2015-12-02 NOTE — Progress Notes (Signed)
MEDICATION RELATED CONSULT NOTE - Follow Up  Pharmacy Consult for monitoring/recommending change in therapy secondary to drug interactions with amiodarone Indication: New start amiodarone  Allergies  Allergen Reactions  . Contrast Media [Iodinated Diagnostic Agents] Other (See Comments)    Flushed, cough, anxiety, mild less responsiveness.   Patient Measurements: Height: 5\' 6"  (167.6 cm) Weight: 219 lb 4.8 oz (99.474 kg) IBW/kg (Calculated) : 59.3  Vital Signs: Temp: 97.4 F (36.3 C) (05/04 0428) Temp Source: Oral (05/04 0428) BP: 176/94 mmHg (05/04 0428) Pulse Rate: 78 (05/04 0428) Intake/Output from previous day: 05/03 0701 - 05/04 0700 In: 1010.3 [P.O.:600; I.V.:260.3; IV Piggyback:150] Out: 1325 [Urine:1325] Intake/Output from this shift:    Labs:  Recent Labs  11/29/15 0937 11/29/15 1946 11/30/15 0538 11/30/15 1620 12/01/15 0505 12/02/15 0505  WBC 16.8*  --  11.8*  --  14.9* 16.2*  HGB 13.5  --  11.3*  --  10.4* 10.3*  HCT 41.0  --  34.5*  --  31.7* 31.4*  PLT 140*  --  117*  --  120* 140*  APTT 33  --   --   --   --   --   CREATININE 2.01*  --  1.86*  --  1.67* 1.96*  LABCREA  --   --   --  31  --   --   MG  --  2.0 2.0  --  2.0  --   PHOS  --   --  3.4  --  2.9  --   ALBUMIN 3.8  --   --   --   --   --   PROT 8.1  --   --   --   --   --   AST 80*  --   --   --   --   --   ALT 24  --   --   --   --   --   ALKPHOS 64  --   --   --   --   --   BILITOT 1.1  --   --   --   --   --    Estimated Creatinine Clearance: 34.1 mL/min (by C-G formula based on Cr of 1.96).   Microbiology: Recent Results (from the past 720 hour(s))  Blood Culture (routine x 2)     Status: None (Preliminary result)   Collection Time: 11/29/15  9:42 AM  Result Value Ref Range Status   Specimen Description BLOOD LEFT AC  Final   Special Requests   Final    BOTTLES DRAWN AEROBIC AND ANAEROBIC AER 4ML ANA 2ML   Culture NO GROWTH 2 DAYS  Final   Report Status PENDING  Incomplete   Blood Culture (routine x 2)     Status: None (Preliminary result)   Collection Time: 11/29/15  9:44 AM  Result Value Ref Range Status   Specimen Description BLOOD RIGHT AC  Final   Special Requests   Final    BOTTLES DRAWN AEROBIC AND ANAEROBIC ANA 4ML AER 2ML   Culture NO GROWTH 2 DAYS  Final   Report Status PENDING  Incomplete  Urine culture     Status: Abnormal   Collection Time: 11/29/15 10:00 AM  Result Value Ref Range Status   Specimen Description URINE, RANDOM  Final   Special Requests NONE  Final   Culture 80,000 COLONIES/mL ESCHERICHIA COLI (A)  Final   Report Status 12/01/2015 FINAL  Final   Organism ID, Bacteria  ESCHERICHIA COLI (A)  Final      Susceptibility   Escherichia coli - MIC*    AMPICILLIN <=2 SENSITIVE Sensitive     CEFAZOLIN <=4 SENSITIVE Sensitive     CEFTRIAXONE <=1 SENSITIVE Sensitive     CIPROFLOXACIN <=0.25 SENSITIVE Sensitive     GENTAMICIN <=1 SENSITIVE Sensitive     IMIPENEM <=0.25 SENSITIVE Sensitive     NITROFURANTOIN <=16 SENSITIVE Sensitive     TRIMETH/SULFA <=20 SENSITIVE Sensitive     AMPICILLIN/SULBACTAM <=2 SENSITIVE Sensitive     PIP/TAZO <=4 SENSITIVE Sensitive     Extended ESBL NEGATIVE Sensitive     * 80,000 COLONIES/mL ESCHERICHIA COLI    Medications:  Scheduled:  . amiodarone  200 mg Oral Daily  . aspirin  81 mg Oral Daily  . atorvastatin  40 mg Oral q1800  . brimonidine  1 drop Both Eyes BID  .  ceFAZolin (ANCEF) IV  2 g Intravenous Q8H  . ipratropium-albuterol  3 mL Nebulization Q4H  . latanoprost  1 drop Both Eyes QHS  . methylPREDNISolone (SOLU-MEDROL) injection  40 mg Intravenous Q8H  . metoprolol tartrate  50 mg Oral BID  . potassium chloride  20 mEq Oral Once  . potassium chloride  40 mEq Oral Daily   Infusions:  . heparin 1,100 Units/hr (12/02/15 0547)   PRN: acetaminophen **OR** acetaminophen, albuterol, nitroGLYCERIN, ondansetron **OR** ondansetron (ZOFRAN) IV, oxyCODONE  Assessment: Pharmacy consulted to  monitor medication list and make recommendations regarding dose adjustments secondary to drug-drug interactions with amiodarone.  1. Amiodarone and ondansetron - risk of prolonged QTc 2. Amiodarone and atorvastatin - risk of increased atorvastatin concentration  Plan:  Patient admitted to telemetry unit and is being monitored.  Recommend close monitoring of QTc. Ondansetron is ordered PRN for nausea and no doses have been charted at this time. Monitor LFTs and for myalgia with atorvastatin  Thank you for the consult.   Reis Goga G 12/02/2015,7:40 AM

## 2015-12-02 NOTE — Progress Notes (Signed)
ELECTROLYTE CONSULT NOTE - Follow Up  Pharmacy Consult for electrolyte monitoring and replacement Indication: hypokalemia  Allergies  Allergen Reactions  . Contrast Media [Iodinated Diagnostic Agents] Other (See Comments)    Flushed, cough, anxiety, mild less responsiveness.   Patient Measurements: Height: 5\' 6"  (167.6 cm) Weight: 219 lb 4.8 oz (99.474 kg) IBW/kg (Calculated) : 59.3  Vital Signs: Temp: 97.4 F (36.3 C) (05/04 0428) Temp Source: Oral (05/04 0428) BP: 176/94 mmHg (05/04 0428) Pulse Rate: 78 (05/04 0428) Intake/Output from previous day: 05/03 0701 - 05/04 0700 In: 1010.3 [P.O.:600; I.V.:260.3; IV Piggyback:150] Out: 1325 [Urine:1325] Intake/Output from this shift:    Estimated Creatinine Clearance: 34.1 mL/min (by C-G formula based on Cr of 1.96).  Medical History: Past Medical History  Diagnosis Date  . Hypertension   . Diabetes mellitus     diagnosed at age 35, on a "pill" (?metformin), but now diet-controlled  . Glaucoma   . Hyperlipidemia     managed with lifestyle modification  . Tobacco abuse     No PFT's or diagnosis of COPD   Assessment: Pharmacy consulted to monitor and replace electrolytes if needed in this 65 year old female admitted with sepsis and new onset atrial fibrillation.  Orders for KCl 40 meq po daily.  5/4: K: 3.9 5/3: Mag: 2.0, Phos: 2.9  Goal of Therapy:  Electrolytes within normal limits  Plan:  Continue current orders for KCl 40 meq po daily.  No supplementation warranted at this time.  Will follow up with BMP in AM.    Loleta Dicker, PharmD Clinical Pharmacist 12/02/2015,7:37 AM

## 2015-12-02 NOTE — Progress Notes (Signed)
[facility]  ROUNDING NOTE   Subjective:  Creatinine currently 1.96. She continues to have extensive right lower extremity cellulitis. Abnormal UPEP noted.  Objective:  Vital signs in last 24 hours:  Temp:  [97.4 F (36.3 C)-98.8 F (37.1 C)] 98.2 F (36.8 C) (05/04 1148) Pulse Rate:  [73-91] 73 (05/04 1148) Resp:  [16-18] 18 (05/04 1148) BP: (160-176)/(87-97) 173/97 mmHg (05/04 1148) SpO2:  [96 %-100 %] 100 % (05/04 1148) FiO2 (%):  [36 %] 36 % (05/03 1538)  Weight change:  Filed Weights   11/29/15 0935 11/29/15 1725  Weight: 97.523 kg (215 lb) 99.474 kg (219 lb 4.8 oz)    Intake/Output: I/O last 3 completed shifts: In: 1010.3 [P.O.:600; I.V.:260.3; IV Piggyback:150] Out: 2250 [Urine:2250]   Intake/Output this shift:  Total I/O In: -  Out: 150 [Urine:150]  Physical Exam: General: NAD, resting in bed  Head: Normocephalic, atraumatic. Moist oral mucosal membranes  Eyes: Anicteric  Neck: Supple, trachea midline  Lungs:  Clear to auscultation normal effort  Heart: Regular rate and rhythm no rubs  Abdomen:  Soft, nontender, BS present  Extremities: Extensive cellulitis of the RLE, multiple excoriations of RLE  Neurologic: Nonfocal, moving all four extremities  Skin: Excoriations b/l LE's, cellulitis of RLE       Basic Metabolic Panel:  Recent Labs Lab 11/29/15 0937 11/29/15 1946 11/30/15 0538 12/01/15 0505 12/01/15 1918 12/02/15 0505  NA 144  --  141 141  --  136  K 3.2*  --  3.4* 3.2* 3.4* 3.9  CL 104  --  108 107  --  105  CO2 23  --  24 26  --  23  GLUCOSE 124*  --  135* 189*  --  208*  BUN 40*  --  40* 39*  --  48*  CREATININE 2.01*  --  1.86* 1.67*  --  1.96*  CALCIUM 9.2  --  8.4* 8.7*  --  8.6*  MG  --  2.0 2.0 2.0  --   --   PHOS  --   --  3.4 2.9  --   --     Liver Function Tests:  Recent Labs Lab 11/29/15 0937  AST 80*  ALT 24  ALKPHOS 64  BILITOT 1.1  PROT 8.1  ALBUMIN 3.8    Recent Labs Lab 11/29/15 0937   LIPASE 12   No results for input(s): AMMONIA in the last 168 hours.  CBC:  Recent Labs Lab 11/29/15 0937 11/30/15 0538 12/01/15 0505 12/02/15 0505  WBC 16.8* 11.8* 14.9* 16.2*  NEUTROABS 16.3* 11.0*  --   --   HGB 13.5 11.3* 10.4* 10.3*  HCT 41.0 34.5* 31.7* 31.4*  MCV 90.0 92.1 89.6 90.0  PLT 140* 117* 120* 140*    Cardiac Enzymes:  Recent Labs Lab 11/29/15 0937 11/29/15 1935 11/29/15 2313 11/30/15 0538  TROPONINI 5.24* 6.47* 6.01* 4.98*    BNP: Invalid input(s): POCBNP  CBG: No results for input(s): GLUCAP in the last 168 hours.  Microbiology: Results for orders placed or performed during the hospital encounter of 11/29/15  Blood Culture (routine x 2)     Status: None (Preliminary result)   Collection Time: 11/29/15  9:42 AM  Result Value Ref Range Status   Specimen Description BLOOD LEFT AC  Final   Special Requests   Final    BOTTLES DRAWN AEROBIC AND ANAEROBIC AER 4ML ANA 2ML   Culture NO GROWTH 3 DAYS  Final   Report Status PENDING  Incomplete  Blood Culture (routine x 2)     Status: None (Preliminary result)   Collection Time: 11/29/15  9:44 AM  Result Value Ref Range Status   Specimen Description BLOOD RIGHT AC  Final   Special Requests   Final    BOTTLES DRAWN AEROBIC AND ANAEROBIC ANA 4ML AER 2ML   Culture NO GROWTH 3 DAYS  Final   Report Status PENDING  Incomplete  Urine culture     Status: Abnormal   Collection Time: 11/29/15 10:00 AM  Result Value Ref Range Status   Specimen Description URINE, RANDOM  Final   Special Requests NONE  Final   Culture 80,000 COLONIES/mL ESCHERICHIA COLI (A)  Final   Report Status 12/01/2015 FINAL  Final   Organism ID, Bacteria ESCHERICHIA COLI (A)  Final      Susceptibility   Escherichia coli - MIC*    AMPICILLIN <=2 SENSITIVE Sensitive     CEFAZOLIN <=4 SENSITIVE Sensitive     CEFTRIAXONE <=1 SENSITIVE Sensitive     CIPROFLOXACIN <=0.25 SENSITIVE Sensitive     GENTAMICIN <=1 SENSITIVE Sensitive      IMIPENEM <=0.25 SENSITIVE Sensitive     NITROFURANTOIN <=16 SENSITIVE Sensitive     TRIMETH/SULFA <=20 SENSITIVE Sensitive     AMPICILLIN/SULBACTAM <=2 SENSITIVE Sensitive     PIP/TAZO <=4 SENSITIVE Sensitive     Extended ESBL NEGATIVE Sensitive     * 80,000 COLONIES/mL ESCHERICHIA COLI    Coagulation Studies: No results for input(s): LABPROT, INR in the last 72 hours.  Urinalysis: No results for input(s): COLORURINE, LABSPEC, PHURINE, GLUCOSEU, HGBUR, BILIRUBINUR, KETONESUR, PROTEINUR, UROBILINOGEN, NITRITE, LEUKOCYTESUR in the last 72 hours.  Invalid input(s): APPERANCEUR    Imaging: Ct Abdomen Pelvis Wo Contrast  12/01/2015  CLINICAL DATA:  Bilateral hydronephrosis.  Acute renal failure. EXAM: CT ABDOMEN AND PELVIS WITHOUT CONTRAST TECHNIQUE: Multidetector CT imaging of the abdomen and pelvis was performed following the standard protocol without IV contrast. COMPARISON:  Renal ultrasound dated 11/30/2015. FINDINGS: Lower chest: Dense consolidation at the right lung base could represent atelectasis, aspiration or pneumonia. Small adjacent pleural effusion. Probable cardiomegaly, incompletely imaged. Hepatobiliary: Gallbladder appears to be filled with stones but there are no adjacent inflammatory changes to suggest acute cholecystitis. No mass or focal lesion identified within the liver on this unenhanced exam. Pancreas: No mass or inflammatory process identified on this un-enhanced exam. Spleen: Within normal limits in size. Adrenals/Urinary Tract: Left adrenal mass measures approximately 1.9 x 1.3 cm with CT density measurements suggesting benign lipid-rich adrenal adenoma. Right adrenal gland appears normal. There is renal pelviectasis bilaterally, versus minimal bilateral hydronephrosis. Left renal cyst measuring 1.9 cm. No hydroureter. No renal or ureteral calculi. Bladder appears normal. No bladder stone or bladder wall thickening. Stomach/Bowel: Bowel is normal in caliber. There is  diverticulosis of the sigmoid colon but no inflammatory changes to suggest acute diverticulitis. No bowel wall thickening or evidence of bowel wall inflammation seen. Appendix is normal. Stomach appears normal. Vascular/Lymphatic: Scattered atherosclerotic changes of the normal- caliber abdominal aorta. No enlarged lymph nodes seen. Reproductive: Status post hysterectomy. Adnexal regions are unremarkable. Other: No free fluid or abscess collections seen. No free intraperitoneal air. Musculoskeletal: Degenerative changes within the lumbar spine. Large degenerative subchondral cysts within the right femoral head. No acute or suspicious osseous lesions seen. Single enlarged lymph node is seen in the right inguinal/groin region measuring 1.7 cm short axis dimension. Scattered additional smaller lymph nodes within the bilateral groin regions. Mild edema within the subcutaneous  soft tissues of the right groin (localized cellulitis? ). IMPRESSION: 1. Dense consolidation within the posterior right lower lung which could represent pneumonia, aspiration or atelectasis. Small adjacent pleural effusion. 2. Minimal bilateral hydronephrosis versus pelviectasis. No renal or ureteral calculi. No ureteral dilatation. Bladder appears normal. 3. Left adrenal mass measuring 1.9 x 1.3 cm, too small to definitively characterize but with CT density measurements suggesting benign adrenal adenoma. Could consider follow-up with adrenal protocol MRI after current issues are resolved. 4. Sigmoid colon diverticulosis without evidence of acute diverticulitis. 5. Cholelithiasis without evidence of acute cholecystitis. 6. Single enlarged lymph node in the right inguinal/groin region measuring 1.7 cm short axis dimension. Several additional smaller lymph nodes within the bilateral groin regions. Perhaps mild edema-like change within the subcutaneous soft tissues of the right groin (localized mild cellulitis?, mild lymphadenitis?). Electronically  Signed   By: Franki Cabot M.D.   On: 12/01/2015 19:02   Dg Chest 1 View  12/01/2015  CLINICAL DATA:  Hypoxia.  Sepsis. EXAM: CHEST 1 VIEW COMPARISON:  11/30/2015 FINDINGS: Moderate cardiomegaly stable. Both lungs are clear. Previously seen right infrahilar opacity is no longer visualized on today's exam. No evidence of pleural effusion or pneumothorax. IMPRESSION: Cardiomegaly.  No active lung disease seen on today's exam. Electronically Signed   By: Earle Gell M.D.   On: 12/01/2015 12:05   Dg Chest 1 View  11/30/2015  CLINICAL DATA:  Shortness of breath. EXAM: CHEST 1 VIEW COMPARISON:  Nov 29, 2015. FINDINGS: Stable cardiomegaly. No pneumothorax or pleural effusion is noted. Left lung is clear. New mild right infrahilar opacity is noted concerning for edema or inflammation. Bony thorax is unremarkable. IMPRESSION: New mild right infrahilar opacity is noted concerning for edema or inflammation. Electronically Signed   By: Marijo Conception, M.D.   On: 11/30/2015 14:22   US Renal  11/30/2015  CLINICAL DATA:  Acute renal failure EXAM: RENAL / URINARY TRACT ULTRASOUND COMPLETE COMPARISON:  None. FINDINGS: Right Kidney: Length: 12.9 cm. Echogenic renal parenchyma. Trace perinephric fluid. Mild hydronephrosis. Left Kidney: Length: 12.9 cm. Echogenic renal parenchyma. 2.3 x 1.7 x 2.0 cm lower pole simple cyst. Mild hydronephrosis. Bladder: Within normal limits. IMPRESSION: Mild hydronephrosis bilaterally. 2.3 cm left lower pole simple cyst. Echogenic renal parenchyma, suggesting medical renal disease. Electronically Signed   By: Julian Hy M.D.   On: 11/30/2015 17:06   Nm Pulmonary Perf And Vent  12/01/2015  CLINICAL DATA:  Hypoxia, short of breath.  Lower extremity swelling. EXAM: NUCLEAR MEDICINE VENTILATION - PERFUSION LUNG SCAN TECHNIQUE: Ventilation images were obtained in multiple projections using inhaled aerosol Tc-70m DTPA. Perfusion images were obtained in multiple projections after intravenous  injection of Tc-88m MAA. RADIOPHARMACEUTICALS:  32.8 mCi Technetium-56m DTPA aerosol inhalation and 4.2 mCi Technetium-33m MAA IV COMPARISON:  Chest radiograph 537 T FINDINGS: Ventilation: No focal ventilation defect. Perfusion: No wedge shaped peripheral perfusion defects to suggest acute pulmonary embolism. Cardiomegaly noted. IMPRESSION: No evidence of pulmonary embolism. Electronically Signed   By: Suzy Bouchard M.D.   On: 12/01/2015 17:05     Medications:   . sodium chloride 50 mL/hr at 12/02/15 1159  . heparin 1,100 Units/hr (12/02/15 0547)   . amiodarone  200 mg Oral Daily  . aspirin  81 mg Oral Daily  . atorvastatin  40 mg Oral q1800  . brimonidine  1 drop Both Eyes BID  .  ceFAZolin (ANCEF) IV  2 g Intravenous Q8H  . ipratropium-albuterol  3 mL Nebulization Q4H  . latanoprost  1 drop Both Eyes QHS  . metoprolol tartrate  50 mg Oral BID  . potassium chloride  20 mEq Oral Once  . potassium chloride  40 mEq Oral Daily  . [START ON 12/03/2015] predniSONE  50 mg Oral Q breakfast   acetaminophen **OR** acetaminophen, albuterol, nitroGLYCERIN, ondansetron **OR** ondansetron (ZOFRAN) IV, oxyCODONE  Assessment/ Plan:  65 y.o. female with a PMHX of with a PMHx of hypertension, diabetes mellitus type 2, glaucoma, hyperlipidemia, history of tobacco abuse, who was admitted to [facility] on 11/29/2015 for evaluation of Cellulitis of the right lower extremity as well as malaise. We were asked to see her for evaluation management of acute renal failure in this setting. The patient's baseline creatinine appears to be 1.2.   1. Acute renal failure/chronic kidney disease stage III baseline creatinine 1.2/diabetes mellitus type 2 with chronic kidney disease/proteinuria. Acute renal failure secondary to concurrent infection as well as use of Aleve and BC powder. -minimal bilateral hydronephrosis versus pelvicaliectasis noted.  We will obtain urology opinion regarding this.  2. Right lower extremity  cellulitis. patient switched to cefazolin for treatment of her lower extremity cellulitis.  3. Hypertension.  lood pressure remains a bit high at 173/97.  Continue metoprolol for now.  I'm hesitant to add amlodipine givenextensive lower extremity edema at the moment.  4.  Hypokalemia.  Potassium up to 3.9 post repletion.  5. Abnormal UPEP.  Obtain hematology consultation for this.   LOS: 3 Nyajah Hyson 5/4/201712:23 PM

## 2015-12-02 NOTE — Consult Note (Signed)
@ENCDATE @ 4:31 PM   Patricia Ewing [birth year] [id number]  Referring provider: Dr. Serita Grit  Chief Complaint  Patient presents with  . Weakness    HPI: The patient is a 65 year old female with no GU past medical history who presented to the hospital with nausea, vomiting, and weakness. She was noted to have acute on chronic renal failure. She underwent a CT scan which showed hydronephrosis versus pelviectasis bilaterally for which urology was consulted for evaluation. The patient denies flank pain at this time. She does have urinary tract infection with Escherichia coli. She does not get frequent urinary tract infections. She denies flank pain. She is never seen urologist before. She has no history of nephrolithiasis.  She also has cellulitis and swelling of her right lower extremity.  On my review of the CT images,it appears that she has bilateral extra renal pelvises with no significant hydronephrosis.  PMH: Past Medical History  Diagnosis Date  . Hypertension   . Diabetes mellitus     diagnosed at age 60, on a "pill" (?metformin), but now diet-controlled  . Glaucoma   . Hyperlipidemia     managed with lifestyle modification  . Tobacco abuse     No PFT's or diagnosis of COPD    Surgical History: Past Surgical History  Procedure Laterality Date  . Abdominal hysterectomy  1984    Home Medications:    Medication List    ASK your doctor about these medications        albuterol 108 (90 Base) MCG/ACT inhaler  Commonly known as:  PROVENTIL HFA;VENTOLIN HFA  Inhale 1 puff into the lungs every 6 (six) hours as needed. Shortness of breath     aspirin 81 MG tablet  Take 81 mg by mouth daily.     brimonidine 0.2 % ophthalmic solution  Commonly known as:  ALPHAGAN  Place 1 drop into both eyes 2 (two) times daily.     cetirizine 10 MG tablet  Commonly known as:  ZYRTEC  Take 10 mg by mouth daily as needed. allergies     cloNIDine 0.2 MG tablet  Commonly known as:   CATAPRES  Take 0.2 mg by mouth daily.     furosemide 40 MG tablet  Commonly known as:  LASIX  Take 40 mg by mouth 2 (two) times daily.     latanoprost 0.005 % ophthalmic solution  Commonly known as:  XALATAN  Place 1 drop into both eyes.     potassium chloride SA 20 MEQ tablet  Commonly known as:  K-DUR,KLOR-CON  Take 20 mEq by mouth daily.        Allergies:  Allergies  Allergen Reactions  . Contrast Media [Iodinated Diagnostic Agents] Other (See Comments)    Flushed, cough, anxiety, mild less responsiveness.    Family History: Family History  Problem Relation Age of Onset  . Heart attack Father 66  . Lymphoma Son   . Colon cancer Other     Social History:  reports that she has been smoking Cigarettes.  She has a 20 pack-year smoking history. She does not have any smokeless tobacco history on file. She reports that she drinks about 0.5 - 1.0 oz of alcohol per week. She reports that she does not use illicit drugs.  ROS: 12 point ROS negative except for above  Physical Exam: BP 173/97 mmHg  Pulse 73  Temp(Src) 98.2 F (36.8 C) (Oral)  Resp 18  Ht 5\' 6"  (1.676 m)  Wt 219 lb 4.8 oz (99.474 kg)  BMI 35.41 kg/m2  SpO2 100%  Constitutional:  Alert and oriented, No acute distress. HEENT: [place] AT, moist mucus membranes.  Trachea midline, no masses. Cardiovascular: No clubbing, cyanosis, or edema. Respiratory: Normal respiratory effort, no increased work of breathing. GI: Abdomen is soft, nontender, nondistended, no abdominal masses GU: No CVA tenderness.  Skin: No rashes, bruises or suspicious lesions. Lymph: No cervical or inguinal adenopathy. Neurologic: Grossly intact, no focal deficits, moving all 4 extremities. Psychiatric: Normal mood and affect.  Laboratory Data: Lab Results  Component Value Date   WBC 16.2* 12/02/2015   HGB 10.3* 12/02/2015   HCT 31.4* 12/02/2015   MCV 90.0 12/02/2015   PLT  140* 12/02/2015    Lab Results  Component Value Date   CREATININE 1.96* 12/02/2015    No results found for: PSA  No results found for: TESTOSTERONE  Lab Results  Component Value Date   HGBA1C 5.8* 11/30/2011    Urinalysis    Component Value Date/Time   COLORURINE YELLOW* 11/29/2015 1000   APPEARANCEUR HAZY* 11/29/2015 1000   LABSPEC 1.014 11/29/2015 1000   PHURINE 5.0 11/29/2015 1000   GLUCOSEU NEGATIVE 11/29/2015 1000   HGBUR 3+* 11/29/2015 1000   BILIRUBINUR NEGATIVE 11/29/2015 1000   KETONESUR TRACE* 11/29/2015 1000   PROTEINUR 100* 11/29/2015 1000   NITRITE POSITIVE* 11/29/2015 1000   LEUKOCYTESUR TRACE* 11/29/2015 1000    Pertinent Imaging: On my review of the CT images,it appears that she has bilateral extra renal pelvises with no significant hydronephrosis.  Assessment & Plan:    1. Acute on chronic renal failure 2. UTI 3. Bilateral extra renal pelvis On my review the images, I do not believe the patient has hydronephrosis. These appear to be extrarenal pelvises bilaterally which is a normal anatomic variant.  Her imaging results certainly do not explain her acute renal failure. No urological intervention is indicated. Agree with continuing antibiotics for E. coli UTI.   Nickie Retort, MD  [facility] [address], [place], [place] [address] [phone]

## 2015-12-02 NOTE — Progress Notes (Signed)
[facility] NAME: Patricia Ewing    MR#:  [id number]  DATE OF BIRTH:  [birth year]  SUBJECTIVE:   Breathing much improved. Patient is now on 2 L of oxygen sats are around 99 -100%. Right lower extremity swelling present. Patient has kept it elevated with pillows underneath.  REVIEW OF SYSTEMS:    Review of Systems  Constitutional: Negative for fever, chills and malaise/fatigue.  HENT: Negative for ear discharge, ear pain, hearing loss, nosebleeds and sore throat.   Eyes: Negative for blurred vision and pain.  Respiratory: Negative for cough, hemoptysis, shortness of breath and wheezing.   Cardiovascular: Negative for chest pain, palpitations and leg swelling.  Gastrointestinal: Negative for nausea, vomiting, abdominal pain, diarrhea and blood in stool.  Genitourinary: Negative for dysuria.  Musculoskeletal: Negative for back pain.  Skin:       RLE redness  Neurological: Negative for dizziness, tremors, speech change, focal weakness, seizures and headaches.  Endo/Heme/Allergies: Does not bruise/bleed easily.  Psychiatric/Behavioral: Negative for depression, suicidal ideas and hallucinations.    Tolerating Diet:yes  DRUG ALLERGIES:   Allergies  Allergen Reactions  . Contrast Media [Iodinated Diagnostic Agents] Other (See Comments)    Flushed, cough, anxiety, mild less responsiveness.    VITALS:  Blood pressure 173/97, pulse 73, temperature 98.2 F (36.8 C), temperature source Oral, resp. rate 18, height 5\' 6"  (1.676 m), weight 99.474 kg (219 lb 4.8 oz), SpO2 100 %.  PHYSICAL EXAMINATION:   Physical Exam  Constitutional: She is oriented to person, place, and time and well-developed, well-nourished, and in no distress. No distress.  HENT:  Head: Normocephalic.  Eyes: No scleral icterus.  Neck: Normal range of motion. Neck supple. No JVD present. No tracheal deviation present.  Cardiovascular: Normal rate, regular rhythm and  normal heart sounds.  Exam reveals no gallop and no friction rub.   No murmur heard. Pulmonary/Chest: Effort normal. No respiratory distress. She has wheezes. She has no rales. She exhibits no tenderness.  Abdominal: Soft. Bowel sounds are normal. She exhibits no distension and no mass. There is no tenderness. There is no rebound and no guarding.  Musculoskeletal: Normal range of motion. She exhibits no edema.  Neurological: She is alert and oriented to person, place, and time.  Skin: Skin is warm. There is erythema.  extensive RLE cellulitis  Psychiatric: Affect and judgment normal.      LABORATORY PANEL:   CBC  Recent Labs Lab 12/02/15 0505  WBC 16.2*  HGB 10.3*  HCT 31.4*  PLT 140*   ------------------------------------------------------------------------------------------------------------------  Chemistries   Recent Labs Lab 11/29/15 0937  12/01/15 0505  12/02/15 0505  NA 144  < > 141  --  136  K 3.2*  < > 3.2*  < > 3.9  CL 104  < > 107  --  105  CO2 23  < > 26  --  23  GLUCOSE 124*  < > 189*  --  208*  BUN 40*  < > 39*  --  48*  CREATININE 2.01*  < > 1.67*  --  1.96*  CALCIUM 9.2  < > 8.7*  --  8.6*  MG  --   < > 2.0  --   --   AST 80*  --   --   --   --   ALT 24  --   --   --   --   ALKPHOS 64  --   --   --   --  BILITOT 1.1  --   --   --   --   < > = values in this interval not displayed. ------------------------------------------------------------------------------------------------------------------  Cardiac Enzymes  Recent Labs Lab 11/29/15 1935 11/29/15 2313 11/30/15 0538  TROPONINI 6.47* 6.01* 4.98*   ------------------------------------------------------------------------------------------------------------------  RADIOLOGY:  Ct Abdomen Pelvis Wo Contrast  12/01/2015  CLINICAL DATA:  Bilateral hydronephrosis.  Acute renal failure. EXAM: CT ABDOMEN AND PELVIS WITHOUT CONTRAST TECHNIQUE: Multidetector CT imaging of the abdomen and pelvis was  performed following the standard protocol without IV contrast. COMPARISON:  Renal ultrasound dated 11/30/2015. FINDINGS: Lower chest: Dense consolidation at the right lung base could represent atelectasis, aspiration or pneumonia. Small adjacent pleural effusion. Probable cardiomegaly, incompletely imaged. Hepatobiliary: Gallbladder appears to be filled with stones but there are no adjacent inflammatory changes to suggest acute cholecystitis. No mass or focal lesion identified within the liver on this unenhanced exam. Pancreas: No mass or inflammatory process identified on this un-enhanced exam. Spleen: Within normal limits in size. Adrenals/Urinary Tract: Left adrenal mass measures approximately 1.9 x 1.3 cm with CT density measurements suggesting benign lipid-rich adrenal adenoma. Right adrenal gland appears normal. There is renal pelviectasis bilaterally, versus minimal bilateral hydronephrosis. Left renal cyst measuring 1.9 cm. No hydroureter. No renal or ureteral calculi. Bladder appears normal. No bladder stone or bladder wall thickening. Stomach/Bowel: Bowel is normal in caliber. There is diverticulosis of the sigmoid colon but no inflammatory changes to suggest acute diverticulitis. No bowel wall thickening or evidence of bowel wall inflammation seen. Appendix is normal. Stomach appears normal. Vascular/Lymphatic: Scattered atherosclerotic changes of the normal- caliber abdominal aorta. No enlarged lymph nodes seen. Reproductive: Status post hysterectomy. Adnexal regions are unremarkable. Other: No free fluid or abscess collections seen. No free intraperitoneal air. Musculoskeletal: Degenerative changes within the lumbar spine. Large degenerative subchondral cysts within the right femoral head. No acute or suspicious osseous lesions seen. Single enlarged lymph node is seen in the right inguinal/groin region measuring 1.7 cm short axis dimension. Scattered additional smaller lymph nodes within the bilateral  groin regions. Mild edema within the subcutaneous soft tissues of the right groin (localized cellulitis? ). IMPRESSION: 1. Dense consolidation within the posterior right lower lung which could represent pneumonia, aspiration or atelectasis. Small adjacent pleural effusion. 2. Minimal bilateral hydronephrosis versus pelviectasis. No renal or ureteral calculi. No ureteral dilatation. Bladder appears normal. 3. Left adrenal mass measuring 1.9 x 1.3 cm, too small to definitively characterize but with CT density measurements suggesting benign adrenal adenoma. Could consider follow-up with adrenal protocol MRI after current issues are resolved. 4. Sigmoid colon diverticulosis without evidence of acute diverticulitis. 5. Cholelithiasis without evidence of acute cholecystitis. 6. Single enlarged lymph node in the right inguinal/groin region measuring 1.7 cm short axis dimension. Several additional smaller lymph nodes within the bilateral groin regions. Perhaps mild edema-like change within the subcutaneous soft tissues of the right groin (localized mild cellulitis?, mild lymphadenitis?). Electronically Signed   By: Franki Cabot M.D.   On: 12/01/2015 19:02   Dg Chest 1 View  12/01/2015  CLINICAL DATA:  Hypoxia.  Sepsis. EXAM: CHEST 1 VIEW COMPARISON:  11/30/2015 FINDINGS: Moderate cardiomegaly stable. Both lungs are clear. Previously seen right infrahilar opacity is no longer visualized on today's exam. No evidence of pleural effusion or pneumothorax. IMPRESSION: Cardiomegaly.  No active lung disease seen on today's exam. Electronically Signed   By: Earle Gell M.D.   On: 12/01/2015 12:05   US Renal  11/30/2015  CLINICAL DATA:  Acute renal failure  EXAM: RENAL / URINARY TRACT ULTRASOUND COMPLETE COMPARISON:  None. FINDINGS: Right Kidney: Length: 12.9 cm. Echogenic renal parenchyma. Trace perinephric fluid. Mild hydronephrosis. Left Kidney: Length: 12.9 cm. Echogenic renal parenchyma. 2.3 x 1.7 x 2.0 cm lower pole simple  cyst. Mild hydronephrosis. Bladder: Within normal limits. IMPRESSION: Mild hydronephrosis bilaterally. 2.3 cm left lower pole simple cyst. Echogenic renal parenchyma, suggesting medical renal disease. Electronically Signed   By: Julian Hy M.D.   On: 11/30/2015 17:06   Nm Pulmonary Perf And Vent  12/01/2015  CLINICAL DATA:  Hypoxia, short of breath.  Lower extremity swelling. EXAM: NUCLEAR MEDICINE VENTILATION - PERFUSION LUNG SCAN TECHNIQUE: Ventilation images were obtained in multiple projections using inhaled aerosol Tc-24m DTPA. Perfusion images were obtained in multiple projections after intravenous injection of Tc-71m MAA. RADIOPHARMACEUTICALS:  32.8 mCi Technetium-52m DTPA aerosol inhalation and 4.2 mCi Technetium-50m MAA IV COMPARISON:  Chest radiograph 537 T FINDINGS: Ventilation: No focal ventilation defect. Perfusion: No wedge shaped peripheral perfusion defects to suggest acute pulmonary embolism. Cardiomegaly noted. IMPRESSION: No evidence of pulmonary embolism. Electronically Signed   By: Suzy Bouchard M.D.   On: 12/01/2015 17:05     ASSESSMENT AND PLAN:    65 year old female with history of essential hypertension and tobacco abuse who presents with weakness over the past several days and found to have atrial fibrillation and RVR and sepsis from urinary tract infection.  1. Sepsis: Patient presented with leukocytosis, fever, tachycardia. Sepsis is secondary UTI/cellulitis/Right lower lobe pneumonia  Urine culture 80,000 gram-negative rods. ID and susceptibilities to follow Blood cultures are negative today.  ContinueIV cefazolin per ID recommendation ID consult for right lower extremity cellulitis. Lower extreme he Doppler negative for DVT -Patient currently still on IV heparin drip. Not sure if she has underlying ischemic leg. She is a chronic smoker. Will have vasculer surgery surgery involvement.  2. Acute Non-ST elevation MI with peak troponin 6.47: Continue heparin  drip, metoprolol atorvastatin, and aspirin. Echocardiogram shows mild diastolic dysfunction with EF of 50% As per cardiology due to sepsis and acute kidney injury no indication for cardiac catheterization at this time  3. Atrial fibrillation with RVR: Patient has now converted to sinus tachycardia. Continue heparin drip, PO amiodarone and metoprolol. Follow up  on echocardiogram. TSH is low, but T3 and T4 normal Suggest repeating TFTs in 4 weeks.  4. Acute kidney injury due to ATN from atrial fibrillation, sepsis and non-ST elevation MI. Creatinine is improving with IV fluids. Patient has adequate urine output. Repeat BMP in a.m. Patient's creatinine rising. IV fluids to be started at low rate.  5. Acute hypoxic respiratory failure: Chest x-ray is not convincing for pneumonia or CHF. VQ scan negative for PE.  6. Glaucoma: Continue eyedrops.  Management plans discussed with the patient and she is in agreement.  CODE STATUS: FULL  TOTAL TIME TAKING CARE OF THIS PATIENT: 28 minutes.     POSSIBLE D/C 2-3 days, DEPENDING ON CLINICAL CONDITION.   Cailen Mihalik M.D on 12/02/2015 at 3:21 PM  Between 7am to 6pm - Pager - [phone] After 6pm go to [email or web address] - password [email or web address] [facility]  Office  [phone]  CC: Primary care physician; Madelyn Brunner, MD  Note: This dictation was prepared with Dragon dictation along with smaller phrase technology. Any transcriptional errors that result from this process are unintentional.

## 2015-12-02 NOTE — Progress Notes (Signed)
SUBJECTIVE: The patient is sitting up on the side of the bed eating breakfast. She is unhappy of present due to worsening right lower leg pain this morning. She denies any chest pain or tightness and breathing is better as patient reports that her oxygen level is 100%.   Filed Vitals:   12/01/15 2135 12/02/15 0012 12/02/15 0421 12/02/15 0428  BP:    176/94  Pulse: 91   78  Temp:    97.4 F (36.3 C)  TempSrc:    Oral  Resp:    16  Height:      Weight:      SpO2:  100% 97% 98%    Intake/Output Summary (Last 24 hours) at 12/02/15 0827 Last data filed at 12/02/15 0659  Gross per 24 hour  Intake 910.33 ml  Output   1325 ml  Net -414.67 ml    LABS: Basic Metabolic Panel:  Recent Labs  11/30/15 0538 12/01/15 0505 12/01/15 1918 12/02/15 0505  NA 141 141  --  136  K 3.4* 3.2* 3.4* 3.9  CL 108 107  --  105  CO2 24 26  --  23  GLUCOSE 135* 189*  --  208*  BUN 40* 39*  --  48*  CREATININE 1.86* 1.67*  --  1.96*  CALCIUM 8.4* 8.7*  --  8.6*  MG 2.0 2.0  --   --   PHOS 3.4 2.9  --   --    Liver Function Tests:  Recent Labs  11/29/15 0937  AST 80*  ALT 24  ALKPHOS 64  BILITOT 1.1  PROT 8.1  ALBUMIN 3.8    Recent Labs  11/29/15 0937  LIPASE 12   CBC:  Recent Labs  11/29/15 0937 11/30/15 0538 12/01/15 0505 12/02/15 0505  WBC 16.8* 11.8* 14.9* 16.2*  NEUTROABS 16.3* 11.0*  --   --   HGB 13.5 11.3* 10.4* 10.3*  HCT 41.0 34.5* 31.7* 31.4*  MCV 90.0 92.1 89.6 90.0  PLT 140* 117* 120* 140*   Cardiac Enzymes:  Recent Labs  11/29/15 1935 11/29/15 2313 11/30/15 0538  TROPONINI 6.47* 6.01* 4.98*   BNP: Invalid input(s): POCBNP D-Dimer: No results for input(s): DDIMER in the last 72 hours. Hemoglobin A1C: No results for input(s): HGBA1C in the last 72 hours. Fasting Lipid Panel: No results for input(s): CHOL, HDL, LDLCALC, TRIG, CHOLHDL, LDLDIRECT in the last 72 hours. Thyroid Function Tests:  Recent Labs  11/29/15 0937  TSH 0.284*   Anemia  Panel: No results for input(s): VITAMINB12, FOLATE, FERRITIN, TIBC, IRON, RETICCTPCT in the last 72 hours.   PHYSICAL EXAM General: Well developed, well nourished, in no acute distress HEENT:  Normocephalic and atramatic Neck:  No JVD.  Lungs: Clear bilaterally to auscultation and percussion. Heart: HRRR . Normal S1 and S2 without gallops or murmurs.  Extremities: Edema and redness of the lower legs right, worse than left  Neuro: Alert and oriented X 3. Psych:  Good affect, responds appropriately  TELEMETRY: Normal sinus rhythm with rates in the 70s to 80s  ASSESSMENT AND PLAN: Atrial fibrillation with rapid ventricular response in the setting of severe cellulitis. The atrial fibrillation responded well to amiodarone which was subsequently transitioned to oral dosing. Patient is anticoagulated with heparin at this time. She can be transitioned to a novel oral anticoagulation agent prior to discharge. Elevated troponin with peak troponin of 6. With the severe right leg cellulitis and renal insufficiency and is not a candidate for catheterization at this time. She  has had no chest pain or tightness throughout this whole episode. We'll continue to follow as she recovers from this infection.  Active Problems:   Sepsis ([place])    Daune Perch, NP 12/02/2015 8:27 AM

## 2015-12-02 NOTE — Progress Notes (Signed)
Pharmacy Antibiotic Note  Patricia Ewing is a 65 y.o. female admitted on 11/29/2015 with UTI and cellulitis.  Pharmacy has been consulted for cefazolin dosing.  Plan: The dose of cefazolin will be adjusted to 1 gm IV q12h based on renal function.  Patient originally ordered Cefazolin 2 gm IV q8h.   Height: 5\' 6"  (167.6 cm) Weight: 219 lb 4.8 oz (99.474 kg) IBW/kg (Calculated) : 59.3  Temp (24hrs), Avg:98.2 F (36.8 C), Min:97.4 F (36.3 C), Max:98.8 F (37.1 C)   Recent Labs Lab 11/29/15 0937 11/29/15 0944 11/29/15 1252 11/29/15 1935 11/30/15 0538 12/01/15 0505 12/02/15 0505  WBC 16.8*  --   --   --  11.8* 14.9* 16.2*  CREATININE 2.01*  --   --   --  1.86* 1.67* 1.96*  LATICACIDVEN  --  2.6* 2.2* 1.5  --   --   --     Estimated Creatinine Clearance: 34.1 mL/min (by C-G formula based on Cr of 1.96).    Allergies  Allergen Reactions  . Contrast Media [Iodinated Diagnostic Agents] Other (See Comments)    Flushed, cough, anxiety, mild less responsiveness.    Antimicrobials this admission: Anti-infectives    Start     Dose/Rate Route Frequency Ordered Stop   12/02/15 1700  ceFAZolin (ANCEF) IVPB 1 g/50 mL premix     1 g 100 mL/hr over 30 Minutes Intravenous Every 12 hours 12/02/15 1355     12/01/15 2200  ceFAZolin (ANCEF) IVPB 2g/100 mL premix  Status:  Discontinued     2 g 200 mL/hr over 30 Minutes Intravenous Every 8 hours 12/01/15 1955 12/02/15 1355   11/29/15 2000  piperacillin-tazobactam (ZOSYN) IVPB 3.375 g  Status:  Discontinued     3.375 g 12.5 mL/hr over 240 Minutes Intravenous Every 8 hours 11/29/15 1250 12/01/15 1955   11/29/15 1700  vancomycin (VANCOCIN) IVPB 1000 mg/200 mL premix  Status:  Discontinued     1,000 mg 200 mL/hr over 60 Minutes Intravenous Every 24 hours 11/29/15 1251 11/30/15 1119   11/29/15 0945  piperacillin-tazobactam (ZOSYN) IVPB 3.375 g     3.375 g 100 mL/hr over 30 Minutes Intravenous  Once 11/29/15 0943 11/29/15 1120   11/29/15 0945   vancomycin (VANCOCIN) IVPB 1000 mg/200 mL premix     1,000 mg 200 mL/hr over 60 Minutes Intravenous  Once 11/29/15 0943 11/29/15 1146      Dose adjustments this admission: Transition from Cefazolin 2 gm IV q8h to 1 gm IV q12h based on est CrCl~34.1  Microbiology results: Results for orders placed or performed during the hospital encounter of 11/29/15  Blood Culture (routine x 2)     Status: None (Preliminary result)   Collection Time: 11/29/15  9:42 AM  Result Value Ref Range Status   Specimen Description BLOOD LEFT AC  Final   Special Requests   Final    BOTTLES DRAWN AEROBIC AND ANAEROBIC AER 4ML ANA 2ML   Culture NO GROWTH 3 DAYS  Final   Report Status PENDING  Incomplete  Blood Culture (routine x 2)     Status: None (Preliminary result)   Collection Time: 11/29/15  9:44 AM  Result Value Ref Range Status   Specimen Description BLOOD RIGHT AC  Final   Special Requests   Final    BOTTLES DRAWN AEROBIC AND ANAEROBIC ANA 4ML AER 2ML   Culture NO GROWTH 3 DAYS  Final   Report Status PENDING  Incomplete  Urine culture     Status: Abnormal  Collection Time: 11/29/15 10:00 AM  Result Value Ref Range Status   Specimen Description URINE, RANDOM  Final   Special Requests NONE  Final   Culture 80,000 COLONIES/mL ESCHERICHIA COLI (A)  Final   Report Status 12/01/2015 FINAL  Final   Organism ID, Bacteria ESCHERICHIA COLI (A)  Final      Susceptibility   Escherichia coli - MIC*    AMPICILLIN <=2 SENSITIVE Sensitive     CEFAZOLIN <=4 SENSITIVE Sensitive     CEFTRIAXONE <=1 SENSITIVE Sensitive     CIPROFLOXACIN <=0.25 SENSITIVE Sensitive     GENTAMICIN <=1 SENSITIVE Sensitive     IMIPENEM <=0.25 SENSITIVE Sensitive     NITROFURANTOIN <=16 SENSITIVE Sensitive     TRIMETH/SULFA <=20 SENSITIVE Sensitive     AMPICILLIN/SULBACTAM <=2 SENSITIVE Sensitive     PIP/TAZO <=4 SENSITIVE Sensitive     Extended ESBL NEGATIVE Sensitive     * 80,000 COLONIES/mL ESCHERICHIA COLI     Thank you  for allowing pharmacy to be a part of this patient's care.  Patricia Ewing G 12/02/2015 1:56 PM

## 2015-12-02 NOTE — Progress Notes (Addendum)
ANTICOAGULATION CONSULT NOTE - Initial Consult  Pharmacy Consult for heparin drip Indication: atrial fibrillation  Allergies  Allergen Reactions  . Contrast Media [Iodinated Diagnostic Agents] Other (See Comments)    Flushed, cough, anxiety, mild less responsiveness.   Patient Measurements: Height: 5\' 6"  (167.6 cm) Weight: 219 lb 4.8 oz (99.474 kg) IBW/kg (Calculated) : 59.3 Heparin Dosing Weight: 81.1 kg  Vital Signs: Temp: 97.4 F (36.3 C) (05/04 0428) Temp Source: Oral (05/04 0428) BP: 176/94 mmHg (05/04 0428) Pulse Rate: 78 (05/04 0428)  Labs:  Recent Labs  11/29/15 0937 11/29/15 1935  11/29/15 2313 11/30/15 0200 11/30/15 0538 12/01/15 0505 12/02/15 0505  HGB 13.5  --   --   --   --  11.3* 10.4* 10.3*  HCT 41.0  --   --   --   --  34.5* 31.7* 31.4*  PLT 140*  --   --   --   --  117* 120* 140*  APTT 33  --   --   --   --   --   --   --   LABPROT 17.9*  --   --   --   --   --   --   --   INR 1.47  --   --   --   --   --   --   --   HEPARINUNFRC  --   --   < >  --  0.34  --  0.47 0.50  CREATININE 2.01*  --   --   --   --  1.86* 1.67*  --   TROPONINI 5.24* 6.47*  --  6.01*  --  4.98*  --   --   < > = values in this interval not displayed. Estimated Creatinine Clearance: 40 mL/min (by C-G formula based on Cr of 1.67).  Medical History: Past Medical History  Diagnosis Date  . Hypertension   . Diabetes mellitus     diagnosed at age 70, on a "pill" (?metformin), but now diet-controlled  . Glaucoma   . Hyperlipidemia     managed with lifestyle modification  . Tobacco abuse     No PFT's or diagnosis of COPD   Assessment: Pharmacy consulted to dose and monitor heparin drip in this 65 year old female for new onset atrial fibrillation. Patient was not taking any anticoagulants prior to admission per med rec. Baseline labs were obtained in the ED.  Hgb 13.5 Plt 140  INR 1.47 Troponin 5.24  APTT 33  Goal of Therapy:  Heparin level 0.3-0.7 units/ml Monitor  platelets by anticoagulation protocol: Yes   Plan:  Give 4050 units bolus x 1 (~50 units/kg) Start heparin infusion at 1100 units/hr (~14 units/kg/hr) Check anti-Xa level in 6 hours and daily while on heparin Continue to monitor H&H and platelets  5/01:  HL @ 19:46 = 0.43.  Will continue this pt on current rate of 1100 units/hr and recheck HL on 5/2 @ 2:00.   5/2 02:00 heparin level 0.34. Continue current regimen. Recheck heparin level and CBC with tomorrow AM labs.  5/3 AM heparin level 0.47. Continue current regimen. Recheck heparin level and CBC with tomorrow AM labs.  5/4 AM heparin level 0.50. Continue current regimen. Recheck heparin level and CBC with tomorrow AM labs.   Thank you for the consult.  Eloise Harman, PharmD Clinical Pharmacist 12/02/2015,5:49 AM

## 2015-12-02 NOTE — Care Management (Signed)
Met with patient at bedside to discuss discharge planning. Admitted with right lower extremity cellulitis and sepsis,  WBC 16.2.  Followed by cardiology ( afib with rvr), nephrology (CKD), hematology (abn. UPEP) , urology (UPEP) and infectious disease (cellulitis), vascular surgery. .  Patient lives at home with her husband. Prior to admission, patient was ambulatory, independent, active and driving. She has a cane and walker at home that was her mothers. PCP is Dr. Gilford Rile. Pharmacy- [facility] [address]. Will follow progression and assess for home health needs.

## 2015-12-03 LAB — CBC
HCT: 29.1 % — ABNORMAL LOW (ref 35.0–47.0)
HEMOGLOBIN: 9.8 g/dL — AB (ref 12.0–16.0)
MCH: 30.5 pg (ref 26.0–34.0)
MCHC: 33.7 g/dL (ref 32.0–36.0)
MCV: 90.6 fL (ref 80.0–100.0)
Platelets: 131 10*3/uL — ABNORMAL LOW (ref 150–440)
RBC: 3.21 MIL/uL — ABNORMAL LOW (ref 3.80–5.20)
RDW: 16.1 % — ABNORMAL HIGH (ref 11.5–14.5)
WBC: 10 10*3/uL (ref 3.6–11.0)

## 2015-12-03 LAB — RETICULOCYTES
RBC.: 3.21 MIL/uL — ABNORMAL LOW (ref 3.80–5.20)
Retic Count, Absolute: 19.3 10*3/uL (ref 19.0–183.0)
Retic Ct Pct: 0.6 % (ref 0.4–3.1)

## 2015-12-03 LAB — BASIC METABOLIC PANEL
Anion gap: 8 (ref 5–15)
BUN: 47 mg/dL — AB (ref 6–20)
CHLORIDE: 105 mmol/L (ref 101–111)
CO2: 25 mmol/L (ref 22–32)
CREATININE: 1.67 mg/dL — AB (ref 0.44–1.00)
Calcium: 8.5 mg/dL — ABNORMAL LOW (ref 8.9–10.3)
GFR calc Af Amer: 36 mL/min — ABNORMAL LOW (ref >60)
GFR calc non Af Amer: 31 mL/min — ABNORMAL LOW (ref >60)
GLUCOSE: 129 mg/dL — AB (ref 65–99)
Potassium: 3.9 mmol/L (ref 3.5–5.1)
SODIUM: 138 mmol/L (ref 135–145)

## 2015-12-03 LAB — RAPID HIV SCREEN (HIV 1/2 AB+AG)
HIV 1/2 ANTIBODIES: NONREACTIVE
HIV-1 P24 ANTIGEN - HIV24: NONREACTIVE

## 2015-12-03 LAB — IRON AND TIBC
Iron: 41 ug/dL (ref 28–170)
Saturation Ratios: 22 % (ref 10.4–31.8)
TIBC: 186 ug/dL — ABNORMAL LOW (ref 250–450)
UIBC: 145 ug/dL

## 2015-12-03 LAB — FOLATE: Folate: 7.3 ng/mL (ref >5.9)

## 2015-12-03 LAB — HEPARIN LEVEL (UNFRACTIONATED): Heparin Unfractionated: 0.54 IU/mL (ref 0.30–0.70)

## 2015-12-03 LAB — SEDIMENTATION RATE: Sed Rate: 83 mm/hr — ABNORMAL HIGH (ref 0–30)

## 2015-12-03 LAB — FERRITIN: Ferritin: 1458 ng/mL — ABNORMAL HIGH (ref 11–307)

## 2015-12-03 MED ORDER — CEFAZOLIN SODIUM-DEXTROSE 2-4 GM/100ML-% IV SOLN
2.0000 g | Freq: Three times a day (TID) | INTRAVENOUS | Status: DC
  Administered 2015-12-03 – 2015-12-06 (×10): 2 g via INTRAVENOUS
  Filled 2015-12-03 (×12): qty 100

## 2015-12-03 MED ORDER — DOCUSATE SODIUM 100 MG PO CAPS
100.0000 mg | ORAL_CAPSULE | Freq: Every day | ORAL | Status: DC
  Administered 2015-12-03 – 2015-12-05 (×3): 100 mg via ORAL
  Filled 2015-12-03 (×4): qty 1

## 2015-12-03 MED ORDER — POLYETHYLENE GLYCOL 3350 17 G PO PACK
17.0000 g | PACK | Freq: Every day | ORAL | Status: DC
  Administered 2015-12-03 – 2015-12-05 (×3): 17 g via ORAL
  Filled 2015-12-03 (×5): qty 1

## 2015-12-03 NOTE — Consult Note (Signed)
[facility] Consult Note  MRN : [id number]  Patricia Ewing is a 65 y.o. (1951/07/18) female who presents with chief complaint of  Chief Complaint  Patient presents with  . Weakness   History of Present Illness:  The patient is a 65 year old morbidly obese female with a past medical history of essential hypertension and tobacco abuse who presented to the ED with weakness and found to have new onset atrial fibrillation with RVR, sepsis from a urinary tract infection and acute renal failure. Patient has been treated for right lower extremity edema with cellulitis as well.   Vascular asked to consult by Dr. Humphrey Rolls for lower extremity edema with cellulitis. Patient states long standing history of bilateral lower extremity "swelling". States her legs "always swell". Informs that when she takes her "medicine they go down." Usually left is worse than right as per patient. She does not wear compression stockings or elevate her lower extremities. She has never been work up for venous / lymphatic disease. Patient already refusing to wear compression stockings. Patient does states pain in lower extremities when walking.    Current Facility-Administered Medications  Medication Dose Route Frequency Provider Last Rate Last Dose  . 0.9 %  sodium chloride infusion   Intravenous Continuous Fritzi Mandes, MD 50 mL/hr at 12/02/15 1159    . acetaminophen (TYLENOL) tablet 650 mg  650 mg Oral Q6H PRN Fritzi Mandes, MD   650 mg at 11/30/15 2002   Or  . acetaminophen (TYLENOL) suppository 650 mg  650 mg Rectal Q6H PRN Fritzi Mandes, MD      . albuterol (PROVENTIL) (2.5 MG/3ML) 0.083% nebulizer solution 2.5 mg  2.5 mg Nebulization Q6H PRN Lenis Noon, RPH      . amiodarone (PACERONE) tablet 200 mg  200 mg Oral Daily Dionisio David, MD   200 mg at 12/03/15 1004  . aspirin chewable tablet 81 mg  81 mg Oral Daily Lenis Noon, RPH   81 mg at 12/03/15 1004  . atorvastatin (LIPITOR) tablet 40  mg  40 mg Oral q1800 Fritzi Mandes, MD   40 mg at 12/02/15 1725  . brimonidine (ALPHAGAN) 0.2 % ophthalmic solution 1 drop  1 drop Both Eyes BID Fritzi Mandes, MD   1 drop at 12/03/15 1033  . ceFAZolin (ANCEF) IVPB 2g/100 mL premix  2 g Intravenous Q8H Crystal G Scarpena, RPH   2 g at 12/03/15 1404  . cloNIDine (CATAPRES) tablet 0.2 mg  0.2 mg Oral Daily Fritzi Mandes, MD   0.2 mg at 12/03/15 1003  . heparin ADULT infusion 100 units/mL (25000 units/250 mL)  1,100 Units/hr Intravenous Continuous Lenis Noon, [facility] 11 mL/hr at 12/02/15 0547 1,100 Units/hr at 12/02/15 0547  . ipratropium-albuterol (DUONEB) 0.5-2.5 (3) MG/3ML nebulizer solution 3 mL  3 mL Nebulization Q4H Sital Mody, MD   3 mL at 12/03/15 1140  . latanoprost (XALATAN) 0.005 % ophthalmic solution 1 drop  1 drop Both Eyes QHS Fritzi Mandes, MD   1 drop at 12/02/15 2235  . metoprolol (LOPRESSOR) tablet 50 mg  50 mg Oral BID Bettey Costa, MD   50 mg at 12/03/15 1004  . nitroGLYCERIN (NITROSTAT) SL tablet 0.4 mg  0.4 mg Sublingual Q5 min PRN Fritzi Mandes, MD      . ondansetron (ZOFRAN) tablet 4 mg  4 mg Oral Q6H PRN Fritzi Mandes, MD       Or  . ondansetron (ZOFRAN) injection 4 mg  4  mg Intravenous Q6H PRN Fritzi Mandes, MD      . oxyCODONE (Oxy IR/ROXICODONE) immediate release tablet 5 mg  5 mg Oral Q4H PRN Fritzi Mandes, MD   5 mg at 12/03/15 0401  . potassium chloride SA (K-DUR,KLOR-CON) CR tablet 20 mEq  20 mEq Oral Once Bettey Costa, MD   20 mEq at 12/01/15 2200  . potassium chloride SA (K-DUR,KLOR-CON) CR tablet 40 mEq  40 mEq Oral Daily Bettey Costa, MD   40 mEq at 12/03/15 1004    Past Medical History  Diagnosis Date  . Hypertension   . Diabetes mellitus     diagnosed at age 61, on a "pill" (?metformin), but now diet-controlled  . Glaucoma   . Hyperlipidemia     managed with lifestyle modification  . Tobacco abuse     No PFT's or diagnosis of COPD    Past Surgical History  Procedure Laterality Date  . Abdominal hysterectomy  1984    Social  History Social History  Substance Use Topics  . Smoking status: Current Every Day Smoker -- 0.50 packs/day for 40 years    Types: Cigarettes  . Smokeless tobacco: None  . Alcohol Use: 0.5 - 1.0 oz/week    1-2 drink(s) per week    Family History Family History  Problem Relation Age of Onset  . Heart attack Father 67  . Lymphoma Son   . Colon cancer Other   Denies family history of renal, venous or PAD.  Allergies  Allergen Reactions  . Contrast Media [Iodinated Diagnostic Agents] Other (See Comments)    Flushed, cough, anxiety, mild less responsiveness.     REVIEW OF SYSTEMS (Negative unless checked)  Constitutional: [] Weight loss  [] Fever  [] Chills Cardiac: [] Chest pain   [] Chest pressure   [] Palpitations   [] Shortness of breath when laying flat   [] Shortness of breath at rest   [] Shortness of breath with exertion. Vascular:  [x] Pain in legs with walking   [] Pain in legs at rest   [] Pain in legs when laying flat   [] Claudication   [] Pain in feet when walking  [] Pain in feet at rest  [] Pain in feet when laying flat   [] History of DVT   [] Phlebitis   [x] Swelling in legs   [x] Varicose veins   [] Non-healing ulcers Pulmonary:   [] Uses home oxygen   [] Productive cough   [] Hemoptysis   [] Wheeze  [] COPD   [] Asthma Neurologic:  [] Dizziness  [] Blackouts   [] Seizures   [] History of stroke   [] History of TIA  [] Aphasia   [] Temporary blindness   [] Dysphagia   [] Weakness or numbness in arms   [] Weakness or numbness in legs Musculoskeletal:  [] Arthritis   [] Joint swelling   [] Joint pain   [] Low back pain Hematologic:  [] Easy bruising  [] Easy bleeding   [] Hypercoagulable state   [] Anemic  [] Hepatitis Gastrointestinal:  [] Blood in stool   [] Vomiting blood  [] Gastroesophageal reflux/heartburn   [] Difficulty swallowing. Genitourinary:  [x] Chronic kidney disease   [] Difficult urination  [] Frequent urination  [] Burning with urination   [] Blood in urine Skin:  [] Rashes   [] Ulcers    [] Wounds Psychological:  [] History of anxiety   []  History of major depression.  Physical Examination  Filed Vitals:   12/03/15 0631 12/03/15 0743 12/03/15 1002 12/03/15 1157  BP: 150/80  150/99 116/67  Pulse: 66  65 56  Temp: 97.9 F (36.6 C)   98.4 F (36.9 C)  TempSrc: Oral   Oral  Resp: 18   16  Height:      Weight:      SpO2: 100% 98%  100%   Body mass index is 35.41 kg/(m^2). Gen:  WD/WN, NAD Head: [place]/AT, No temporalis wasting. Prominent temp pulse not noted. Ear/Nose/Throat: Hearing grossly intact, nares w/o erythema or drainage, oropharynx w/o Erythema/Exudate Eyes: PERRLA, EOMI.  Neck: Supple, no nuchal rigidity.  No bruit or JVD.  Pulmonary:  Good air movement, clear to auscultation bilaterally.  Cardiac: RRR, normal S1, S2, no Murmurs, rubs or gallops. Vascular:  Vessel Right Left  Radial Palpable Palpable  Ulnar Palpable Palpable  Brachial Palpable Palpable  Carotid Palpable, without bruit Palpable, without bruit  Aorta Not palpable N/A  Femoral Palpable Palpable  Popliteal Unable to palpate due to edema Unable to palpate due to edema  PT Unable to palpate due to edema Unable to palpate due to edema  DP Unable to palpate due to edema Unable to palpate due to edema   Right Lower Extremity: Severe pitting edema, cellulitic, severe stasis dermatitis, looks like healed ulcerations.  Left Lower Extremity: Severe pitting edema, severe stasis dermatitis, looks like healed ulcerations.   Gastrointestinal: soft, non-tender/non-distended. No guarding/reflex. No masses Musculoskeletal: M/S 5/5 throughout. No deformity or atrophy.  Neurologic: CN 2-12 intact. Pain and light touch intact in extremities.  Symmetrical.  Speech is fluent. Motor exam as listed above. Psychiatric: Judgment intact, Mood & affect appropriate for pt's clinical situation. Dermatologic: No rashes or ulcers noted.  No open wounds. Lymph : No Cervical, Axillary, or Inguinal  lymphadenopathy.  CBC Lab Results  Component Value Date   WBC 10.0 12/03/2015   HGB 9.8* 12/03/2015   HCT 29.1* 12/03/2015   MCV 90.6 12/03/2015   PLT 131* 12/03/2015   BMET    Component Value Date/Time   NA 138 12/03/2015 0505   K 3.9 12/03/2015 0505   CL 105 12/03/2015 0505   CO2 25 12/03/2015 0505   GLUCOSE 129* 12/03/2015 0505   BUN 47* 12/03/2015 0505   CREATININE 1.67* 12/03/2015 0505   CALCIUM 8.5* 12/03/2015 0505   GFRNONAA 31* 12/03/2015 0505   GFRAA 36* 12/03/2015 0505   Estimated Creatinine Clearance: 40 mL/min (by C-G formula based on Cr of 1.67).  COAG Lab Results  Component Value Date   INR 1.47 11/29/2015    Radiology Ct Abdomen Pelvis Wo Contrast  12/01/2015  CLINICAL DATA:  Bilateral hydronephrosis.  Acute renal failure. EXAM: CT ABDOMEN AND PELVIS WITHOUT CONTRAST TECHNIQUE: Multidetector CT imaging of the abdomen and pelvis was performed following the standard protocol without IV contrast. COMPARISON:  Renal ultrasound dated 11/30/2015. FINDINGS: Lower chest: Dense consolidation at the right lung base could represent atelectasis, aspiration or pneumonia. Small adjacent pleural effusion. Probable cardiomegaly, incompletely imaged. Hepatobiliary: Gallbladder appears to be filled with stones but there are no adjacent inflammatory changes to suggest acute cholecystitis. No mass or focal lesion identified within the liver on this unenhanced exam. Pancreas: No mass or inflammatory process identified on this un-enhanced exam. Spleen: Within normal limits in size. Adrenals/Urinary Tract: Left adrenal mass measures approximately 1.9 x 1.3 cm with CT density measurements suggesting benign lipid-rich adrenal adenoma. Right adrenal gland appears normal. There is renal pelviectasis bilaterally, versus minimal bilateral hydronephrosis. Left renal cyst measuring 1.9 cm. No hydroureter. No renal or ureteral calculi. Bladder appears normal. No bladder stone or bladder wall  thickening. Stomach/Bowel: Bowel is normal in caliber. There is diverticulosis of the sigmoid colon but no inflammatory changes to suggest acute diverticulitis. No bowel wall thickening  or evidence of bowel wall inflammation seen. Appendix is normal. Stomach appears normal. Vascular/Lymphatic: Scattered atherosclerotic changes of the normal- caliber abdominal aorta. No enlarged lymph nodes seen. Reproductive: Status post hysterectomy. Adnexal regions are unremarkable. Other: No free fluid or abscess collections seen. No free intraperitoneal air. Musculoskeletal: Degenerative changes within the lumbar spine. Large degenerative subchondral cysts within the right femoral head. No acute or suspicious osseous lesions seen. Single enlarged lymph node is seen in the right inguinal/groin region measuring 1.7 cm short axis dimension. Scattered additional smaller lymph nodes within the bilateral groin regions. Mild edema within the subcutaneous soft tissues of the right groin (localized cellulitis? ). IMPRESSION: 1. Dense consolidation within the posterior right lower lung which could represent pneumonia, aspiration or atelectasis. Small adjacent pleural effusion. 2. Minimal bilateral hydronephrosis versus pelviectasis. No renal or ureteral calculi. No ureteral dilatation. Bladder appears normal. 3. Left adrenal mass measuring 1.9 x 1.3 cm, too small to definitively characterize but with CT density measurements suggesting benign adrenal adenoma. Could consider follow-up with adrenal protocol MRI after current issues are resolved. 4. Sigmoid colon diverticulosis without evidence of acute diverticulitis. 5. Cholelithiasis without evidence of acute cholecystitis. 6. Single enlarged lymph node in the right inguinal/groin region measuring 1.7 cm short axis dimension. Several additional smaller lymph nodes within the bilateral groin regions. Perhaps mild edema-like change within the subcutaneous soft tissues of the right groin  (localized mild cellulitis?, mild lymphadenitis?). Electronically Signed   By: Franki Cabot M.D.   On: 12/01/2015 19:02   Dg Chest 1 View  12/01/2015  CLINICAL DATA:  Hypoxia.  Sepsis. EXAM: CHEST 1 VIEW COMPARISON:  11/30/2015 FINDINGS: Moderate cardiomegaly stable. Both lungs are clear. Previously seen right infrahilar opacity is no longer visualized on today's exam. No evidence of pleural effusion or pneumothorax. IMPRESSION: Cardiomegaly.  No active lung disease seen on today's exam. Electronically Signed   By: Earle Gell M.D.   On: 12/01/2015 12:05   Dg Chest 1 View  11/30/2015  CLINICAL DATA:  Shortness of breath. EXAM: CHEST 1 VIEW COMPARISON:  Nov 29, 2015. FINDINGS: Stable cardiomegaly. No pneumothorax or pleural effusion is noted. Left lung is clear. New mild right infrahilar opacity is noted concerning for edema or inflammation. Bony thorax is unremarkable. IMPRESSION: New mild right infrahilar opacity is noted concerning for edema or inflammation. Electronically Signed   By: Marijo Conception, M.D.   On: 11/30/2015 14:22   US Renal  11/30/2015  CLINICAL DATA:  Acute renal failure EXAM: RENAL / URINARY TRACT ULTRASOUND COMPLETE COMPARISON:  None. FINDINGS: Right Kidney: Length: 12.9 cm. Echogenic renal parenchyma. Trace perinephric fluid. Mild hydronephrosis. Left Kidney: Length: 12.9 cm. Echogenic renal parenchyma. 2.3 x 1.7 x 2.0 cm lower pole simple cyst. Mild hydronephrosis. Bladder: Within normal limits. IMPRESSION: Mild hydronephrosis bilaterally. 2.3 cm left lower pole simple cyst. Echogenic renal parenchyma, suggesting medical renal disease. Electronically Signed   By: Julian Hy M.D.   On: 11/30/2015 17:06   Nm Pulmonary Perf And Vent  12/01/2015  CLINICAL DATA:  Hypoxia, short of breath.  Lower extremity swelling. EXAM: NUCLEAR MEDICINE VENTILATION - PERFUSION LUNG SCAN TECHNIQUE: Ventilation images were obtained in multiple projections using inhaled aerosol Tc-88m DTPA. Perfusion  images were obtained in multiple projections after intravenous injection of Tc-39m MAA. RADIOPHARMACEUTICALS:  32.8 mCi Technetium-60m DTPA aerosol inhalation and 4.2 mCi Technetium-70m MAA IV COMPARISON:  Chest radiograph 537 T FINDINGS: Ventilation: No focal ventilation defect. Perfusion: No wedge shaped peripheral perfusion defects to suggest  acute pulmonary embolism. Cardiomegaly noted. IMPRESSION: No evidence of pulmonary embolism. Electronically Signed   By: Suzy Bouchard M.D.   On: 12/01/2015 17:05   US Venous Img Lower Unilateral Right  11/29/2015  CLINICAL DATA:  Right lower extremity redness and tenderness for 1 month. No known injury. Initial encounter. EXAM: RIGHT LOWER EXTREMITY VENOUS DOPPLER ULTRASOUND TECHNIQUE: Gray-scale sonography with graded compression, as well as color Doppler and duplex ultrasound were performed to evaluate the lower extremity deep venous systems from the level of the common femoral vein and including the common femoral, femoral, profunda femoral, popliteal and calf veins including the posterior tibial, peroneal and gastrocnemius veins when visible. The superficial great saphenous vein was also interrogated. Spectral Doppler was utilized to evaluate flow at rest and with distal augmentation maneuvers in the common femoral, femoral and popliteal veins. COMPARISON:  None. FINDINGS: Contralateral Common Femoral Vein: Respiratory phasicity is normal and symmetric with the symptomatic side. No evidence of thrombus. Normal compressibility. Common Femoral Vein: No evidence of thrombus. Normal compressibility, respiratory phasicity and response to augmentation. Saphenofemoral Junction: No evidence of thrombus. Normal compressibility and flow on color Doppler imaging. Profunda Femoral Vein: No evidence of thrombus. Normal compressibility and flow on color Doppler imaging. Femoral Vein: No evidence of thrombus. Normal compressibility, respiratory phasicity and response to  augmentation. Popliteal Vein: No evidence of thrombus. Normal compressibility, respiratory phasicity and response to augmentation. Calf Veins: No evidence of thrombus. Normal compressibility and flow on color Doppler imaging. Superficial Great Saphenous Vein: No evidence of thrombus. Normal compressibility and flow on color Doppler imaging. Venous Reflux:  None. Other Findings:  None. IMPRESSION: No evidence of deep venous thrombosis. Electronically Signed   By: Inge Rise M.D.   On: 11/29/2015 10:44   Dg Chest Port 1 View  11/29/2015  CLINICAL DATA:  Dizziness, tachycardia for 2 days, right leg swelling EXAM: PORTABLE CHEST 1 VIEW COMPARISON:  08/24/2014 FINDINGS: Borderline cardiomegaly. No acute infiltrate or pleural effusion. No pulmonary edema. Mild elevation of the right hemidiaphragm. Mild degenerative changes thoracic spine. IMPRESSION: No active disease.  Borderline cardiomegaly. Electronically Signed   By: Lahoma Crocker M.D.   On: 11/29/2015 10:50    Assessment/Plan The patient is a 65 year old morbidly obese female with a past medical history of essential hypertension and tobacco abuse who presented to the ED with weakness and found to have new onset atrial fibrillation with RVR, sepsis from a urinary tract infection and acute renal failure. Patient has been treated for right lower extremity edema with cellulitis as well.  1) Patient at high risk for PAD due to multiple co-morbidities. No immediate intervention necessary. Can follow up as outpatient in our clinic with ABI's 2) Patient mostly likely with component of venous reflux / lymphedema. As inpatient would recommend starting weekly zinc oxide three layer unna wraps to bilateral lower extremities and elevating legs heart level or higher as much as possible. Patient can follow up in our clinic with venous duplex.  3) Continue IV ABX for cellulitis - elevation and compression very important in conjunction with ABX 4) Discussed with Dr.  Mayme Genta, PA-C  12/03/2015 2:36 PM

## 2015-12-03 NOTE — Care Management Important Message (Signed)
Important Message  Patient Details  Name: Patricia Ewing MRN: [id number] Date of Birth: 15-Apr-1951   Medicare Important Message Given:  Yes    Jolly Mango, RN 12/03/2015, 3:15 PM

## 2015-12-03 NOTE — Progress Notes (Signed)
SUBJECTIVE: Patient still has right leg pain no chest pain   Filed Vitals:   12/02/15 2030 12/03/15 0301 12/03/15 0631 12/03/15 0743  BP:   150/80   Pulse:   66   Temp:   97.9 F (36.6 C)   TempSrc:   Oral   Resp:   18   Height:      Weight:      SpO2: 99% 98% 100% 98%    Intake/Output Summary (Last 24 hours) at 12/03/15 0849 Last data filed at 12/03/15 0630  Gross per 24 hour  Intake 1820.73 ml  Output   1450 ml  Net 370.73 ml    LABS: Basic Metabolic Panel:  Recent Labs  12/01/15 0505  12/02/15 0505 12/03/15 0505  NA 141  --  136 138  K 3.2*  < > 3.9 3.9  CL 107  --  105 105  CO2 26  --  23 25  GLUCOSE 189*  --  208* 129*  BUN 39*  --  48* 47*  CREATININE 1.67*  --  1.96* 1.67*  CALCIUM 8.7*  --  8.6* 8.5*  MG 2.0  --   --   --   PHOS 2.9  --   --   --   < > = values in this interval not displayed. Liver Function Tests: No results for input(s): AST, ALT, ALKPHOS, BILITOT, PROT, ALBUMIN in the last 72 hours. No results for input(s): LIPASE, AMYLASE in the last 72 hours. CBC:  Recent Labs  12/02/15 0505 12/03/15 0505  WBC 16.2* 10.0  HGB 10.3* 9.8*  HCT 31.4* 29.1*  MCV 90.0 90.6  PLT 140* 131*   Cardiac Enzymes: No results for input(s): CKTOTAL, CKMB, CKMBINDEX, TROPONINI in the last 72 hours. BNP: Invalid input(s): POCBNP D-Dimer: No results for input(s): DDIMER in the last 72 hours. Hemoglobin A1C: No results for input(s): HGBA1C in the last 72 hours. Fasting Lipid Panel: No results for input(s): CHOL, HDL, LDLCALC, TRIG, CHOLHDL, LDLDIRECT in the last 72 hours. Thyroid Function Tests: No results for input(s): TSH, T4TOTAL, T3FREE, THYROIDAB in the last 72 hours.  Invalid input(s): FREET3 Anemia Panel:  Recent Labs  12/03/15 0505  FOLATE 7.3  FERRITIN 1458*  TIBC 186*  IRON 41  RETICCTPCT 0.6     PHYSICAL EXAM General: Well developed, well nourished, in no acute distress HEENT:  Normocephalic and atramatic Neck:  No JVD.   Lungs: Clear bilaterally to auscultation and percussion. Heart: HRRR . Normal S1 and S2 without gallops or murmurs.  Abdomen: Bowel sounds are positive, abdomen soft and non-tender  Msk:  Back normal, normal gait. Normal strength and tone for age. Extremities: No clubbing, cyanosis or edema.   Neuro: Alert and oriented X 3. Psych:  Good affect, responds appropriately  TELEMETRY:Sinus rhythm  ASSESSMENT AND PLAN: Elevated troponin with peak troponin of 6 and cellulitis of right leg with possible ischemia. Advise the patient to be seen by vascular as may have ischemic changes on the leg. Will not do invasive workup because of cellulitis and renal insufficiency.  Active Problems:   Sepsis ([place])    Dionisio David, MD, [facility] 12/03/2015 8:49 AM     Mode the top

## 2015-12-03 NOTE — Progress Notes (Signed)
A&O. Up with heavy assist to [facility]. Right lower ext red and swollen. Medicated for pain. IV saline and IV heparin infusing.

## 2015-12-03 NOTE — Progress Notes (Signed)
Physical Therapy Treatment Patient Details Name: Patricia Ewing MRN: [id number] DOB: Mar 05, 1951 Today's Date: 12/03/2015    History of Present Illness Pt is a 65 y.o. F admitted to hospital for sepsis, A-fib and acute NSTEMI. Pt has hx of HTN, DM, glaucoma, and hyperlipidemia.     PT Comments    Pts progress towards goals limited by increased pain in R LE. Pt waiting on consult from vascular care to determine treatment for R LE. Pt able to perform bed mobility using mod assist and use of bed rails. Pt able to transfer from EOB with heavy use of B UE and max assist. Pt able to take a few steps side-to-side and forwards/backwards at EOB with max assist. Ambulation limited by pts pain. Pt stated it hurts too much to bear weight on R LE. Will bring RW next PT session. Pt performed supine there-ex on B LE. Pt demonstrates deficits in balance and mobility. Pt would benefit from further skilled PT to address deficits. Due to patient's increase in R LE pain/swelling causing decreased mobility, recommend pt sent to SNF after discharge from acute hospitalization.   Follow Up Recommendations  SNF     Equipment Recommendations  Rolling walker with 5" wheels    Recommendations for Other Services       Precautions / Restrictions Precautions Precautions: Fall Restrictions Weight Bearing Restrictions: No    Mobility  Bed Mobility Overal bed mobility: Needs Assistance Bed Mobility: Sit to Supine       Sit to supine: Mod assist   General bed mobility comments: Pt able to perform bed mobility with use of bed rails and mod assist. Pt required assist with moving R LE and to scoot up in bed.  Transfers Overall transfer level: Needs assistance Equipment used: None Transfers: Sit to/from Stand Sit to Stand: Max assist         General transfer comment: Pt able to transfer from EOB with heavy use of bed rails and max assist. Pt provided heavy cues regarding and placement. Pt required rocking  x3 prior to standing. Once standing, pt required increased time to bear weight on R LE.   Ambulation/Gait Ambulation/Gait assistance: Max assist Ambulation Distance (Feet): 3 Feet Assistive device: None Gait Pattern/deviations: Shuffle Gait velocity: slow   General Gait Details: Pt able to take small steps to side and forward/backwards by EOB with max assist. Pt demonstrated shuffle gait with decreased clearance on R LE. Pt provided heavy cues regarding foot placement. Ambulation limited by pts pain on R LE. Recommended pt use RW for ambulation, will bring next PT session.    Stairs            Wheelchair Mobility    Modified Rankin (Stroke Patients Only)       Balance                                    Cognition Arousal/Alertness: Awake/alert Behavior During Therapy: WFL for tasks assessed/performed Overall Cognitive Status: Within Functional Limits for tasks assessed                      Exercises Other Exercises Other Exercises: Pt performed seated ther-ex on B LE including LAQ, ankle pumps and marching in place with min-mod assist with keeping trunk upright during exercises. All ther-ex performed x12 reps.     General Comments        Pertinent  Vitals/Pain Pain Assessment: Faces Faces Pain Scale: Hurts whole lot Pain Location: R LE Pain Descriptors / Indicators: Aching;Tightness Pain Intervention(s): Limited activity within patient's tolerance    Home Living                      Prior Function            PT Goals (current goals can now be found in the care plan section) Acute Rehab PT Goals Patient Stated Goal: to return to PLOF PT Goal Formulation: With patient Time For Goal Achievement: 12/14/15 Potential to Achieve Goals: Fair Progress towards PT goals: Not progressing toward goals - comment (due to pain in R LE)    Frequency  Min 2X/week    PT Plan Discharge plan needs to be updated    Co-evaluation              End of Session Equipment Utilized During Treatment: Gait belt;Oxygen Activity Tolerance: Patient limited by pain Patient left: in bed;with call bell/phone within reach;with bed alarm set;with family/visitor present     Time: 1317-1340 PT Time Calculation (min) (ACUTE ONLY): 23 min  Charges:                       G Codes:      Sherral Hammers 2015-12-21, 3:24 PM M. Barnett Abu, SPT

## 2015-12-03 NOTE — Progress Notes (Signed)
Pharmacy Antibiotic Note  Patricia Ewing is a 65 y.o. female admitted on 11/29/2015 with UTI and cellulitis.  Pharmacy has been consulted for cefazolin dosing.  Plan: Patient originally ordered Cefazolin 2 gm IV q8h by ID.  Dose was renally adjusted based on CrCl on 5/4.  Est CrCl today of 40 mL/min.  Will transition patient back to Cefazolin 2 gm IV q8h.   Height: 5\' 6"  (167.6 cm) Weight: 219 lb 4.8 oz (99.474 kg) IBW/kg (Calculated) : 59.3  Temp (24hrs), Avg:98.2 F (36.8 C), Min:97.9 F (36.6 C), Max:98.4 F (36.9 C)   Recent Labs Lab 11/29/15 0937 11/29/15 0944 11/29/15 1252 11/29/15 1935 11/30/15 0538 12/01/15 0505 12/02/15 0505 12/03/15 0505  WBC 16.8*  --   --   --  11.8* 14.9* 16.2* 10.0  CREATININE 2.01*  --   --   --  1.86* 1.67* 1.96* 1.67*  LATICACIDVEN  --  2.6* 2.2* 1.5  --   --   --   --     Estimated Creatinine Clearance: 40 mL/min (by C-G formula based on Cr of 1.67).    Allergies  Allergen Reactions  . Contrast Media [Iodinated Diagnostic Agents] Other (See Comments)    Flushed, cough, anxiety, mild less responsiveness.    Antimicrobials this admission: Anti-infectives    Start     Dose/Rate Route Frequency Ordered Stop   12/03/15 0830  ceFAZolin (ANCEF) IVPB 2g/100 mL premix     2 g 200 mL/hr over 30 Minutes Intravenous Every 8 hours 12/03/15 0802     12/02/15 1700  ceFAZolin (ANCEF) IVPB 1 g/50 mL premix  Status:  Discontinued     1 g 100 mL/hr over 30 Minutes Intravenous Every 12 hours 12/02/15 1355 12/03/15 0802   12/01/15 2200  ceFAZolin (ANCEF) IVPB 2g/100 mL premix  Status:  Discontinued     2 g 200 mL/hr over 30 Minutes Intravenous Every 8 hours 12/01/15 1955 12/02/15 1355   11/29/15 2000  piperacillin-tazobactam (ZOSYN) IVPB 3.375 g  Status:  Discontinued     3.375 g 12.5 mL/hr over 240 Minutes Intravenous Every 8 hours 11/29/15 1250 12/01/15 1955   11/29/15 1700  vancomycin (VANCOCIN) IVPB 1000 mg/200 mL premix  Status:  Discontinued      1,000 mg 200 mL/hr over 60 Minutes Intravenous Every 24 hours 11/29/15 1251 11/30/15 1119   11/29/15 0945  piperacillin-tazobactam (ZOSYN) IVPB 3.375 g     3.375 g 100 mL/hr over 30 Minutes Intravenous  Once 11/29/15 0943 11/29/15 1120   11/29/15 0945  vancomycin (VANCOCIN) IVPB 1000 mg/200 mL premix     1,000 mg 200 mL/hr over 60 Minutes Intravenous  Once 11/29/15 0943 11/29/15 1146      Dose adjustments this admission: Transition to Cefazolin 2 gm IV q8h based on improved renal function  Microbiology results: Results for orders placed or performed during the hospital encounter of 11/29/15  Blood Culture (routine x 2)     Status: None (Preliminary result)   Collection Time: 11/29/15  9:42 AM  Result Value Ref Range Status   Specimen Description BLOOD LEFT AC  Final   Special Requests   Final    BOTTLES DRAWN AEROBIC AND ANAEROBIC AER 4ML ANA 2ML   Culture NO GROWTH 3 DAYS  Final   Report Status PENDING  Incomplete  Blood Culture (routine x 2)     Status: None (Preliminary result)   Collection Time: 11/29/15  9:44 AM  Result Value Ref Range Status   Specimen  Description BLOOD RIGHT AC  Final   Special Requests   Final    BOTTLES DRAWN AEROBIC AND ANAEROBIC ANA 4ML AER 2ML   Culture NO GROWTH 3 DAYS  Final   Report Status PENDING  Incomplete  Urine culture     Status: Abnormal   Collection Time: 11/29/15 10:00 AM  Result Value Ref Range Status   Specimen Description URINE, RANDOM  Final   Special Requests NONE  Final   Culture 80,000 COLONIES/mL ESCHERICHIA COLI (A)  Final   Report Status 12/01/2015 FINAL  Final   Organism ID, Bacteria ESCHERICHIA COLI (A)  Final      Susceptibility   Escherichia coli - MIC*    AMPICILLIN <=2 SENSITIVE Sensitive     CEFAZOLIN <=4 SENSITIVE Sensitive     CEFTRIAXONE <=1 SENSITIVE Sensitive     CIPROFLOXACIN <=0.25 SENSITIVE Sensitive     GENTAMICIN <=1 SENSITIVE Sensitive     IMIPENEM <=0.25 SENSITIVE Sensitive     NITROFURANTOIN <=16  SENSITIVE Sensitive     TRIMETH/SULFA <=20 SENSITIVE Sensitive     AMPICILLIN/SULBACTAM <=2 SENSITIVE Sensitive     PIP/TAZO <=4 SENSITIVE Sensitive     Extended ESBL NEGATIVE Sensitive     * 80,000 COLONIES/mL ESCHERICHIA COLI     Thank you for allowing pharmacy to be a part of this patient's care.  Adler Alton G 12/03/2015 8:03 AM

## 2015-12-03 NOTE — Progress Notes (Addendum)
ELECTROLYTE CONSULT NOTE - Follow Up  Pharmacy Consult for electrolyte monitoring and replacement Indication: hypokalemia  Allergies  Allergen Reactions  . Contrast Media [Iodinated Diagnostic Agents] Other (See Comments)    Flushed, cough, anxiety, mild less responsiveness.   Patient Measurements: Height: 5\' 6"  (167.6 cm) Weight: 219 lb 4.8 oz (99.474 kg) IBW/kg (Calculated) : 59.3  Vital Signs: Temp: 97.9 F (36.6 C) (05/05 0631) Temp Source: Oral (05/05 0631) BP: 150/80 mmHg (05/05 0631) Pulse Rate: 66 (05/05 0631) Intake/Output from previous day: 05/04 0701 - 05/05 0700 In: 1820.7 [P.O.:480; I.V.:1290.7; IV Piggyback:50] Out: 1450 [id number] Intake/Output from this shift:    Estimated Creatinine Clearance: 40 mL/min (by C-G formula based on Cr of 1.67).  Medical History: Past Medical History  Diagnosis Date  . Hypertension   . Diabetes mellitus     diagnosed at age 42, on a "pill" (?metformin), but now diet-controlled  . Glaucoma   . Hyperlipidemia     managed with lifestyle modification  . Tobacco abuse     No PFT's or diagnosis of COPD   Assessment: Pharmacy consulted to monitor and replace electrolytes if needed in this 65 year old female admitted with sepsis and new onset atrial fibrillation.  Orders for KCl 40 meq po daily.  5/5: K: 3.9  5/4: K: 3.9 5/3: Mag: 2.0, Phos: 2.9  Goal of Therapy:  Electrolytes within normal limits  Plan:  Continue current orders for KCl 40 meq po daily.  No supplementation warranted at this time. If potassium remains within normal limits may consider monitoring every other day.  Will follow up with BMP/mag/phos in AM.   Loleta Dicker, PharmD Clinical Pharmacist 12/03/2015,8:05 AM

## 2015-12-03 NOTE — Progress Notes (Signed)
[facility] INFECTIOUS DISEASE PROGRESS NOTE Date of Admission:  11/29/2015     ID: Patricia Ewing is a 65 y.o. female with LE cellulitis, UTI  Active Problems:   Sepsis ([place])   Subjective: L:eg improving, no fevers  ROS  Eleven systems are reviewed and negative except per hpi  Medications:  Antibiotics Given (last 72 hours)    Date/Time Action Medication Dose Rate   11/30/15 2002 Given  [per pt request]   piperacillin-tazobactam (ZOSYN) IVPB 3.375 g 3.375 g 12.5 mL/hr   12/01/15 0540 Given   piperacillin-tazobactam (ZOSYN) IVPB 3.375 g 3.375 g 12.5 mL/hr   12/01/15 1433 Given   piperacillin-tazobactam (ZOSYN) IVPB 3.375 g 3.375 g 12.5 mL/hr   12/01/15 2304 Given   ceFAZolin (ANCEF) IVPB 2g/100 mL premix 2 g 200 mL/hr   12/02/15 0538 Given   ceFAZolin (ANCEF) IVPB 2g/100 mL premix 2 g 200 mL/hr   12/02/15 1725 Given   ceFAZolin (ANCEF) IVPB 1 g/50 mL premix 1 g 100 mL/hr   12/03/15 1001 Given   ceFAZolin (ANCEF) IVPB 2g/100 mL premix 2 g 200 mL/hr   12/03/15 1404 Given   ceFAZolin (ANCEF) IVPB 2g/100 mL premix 2 g 200 mL/hr     . amiodarone  200 mg Oral Daily  . aspirin  81 mg Oral Daily  . atorvastatin  40 mg Oral q1800  . brimonidine  1 drop Both Eyes BID  .  ceFAZolin (ANCEF) IV  2 g Intravenous Q8H  . cloNIDine  0.2 mg Oral Daily  . ipratropium-albuterol  3 mL Nebulization Q4H  . latanoprost  1 drop Both Eyes QHS  . metoprolol tartrate  50 mg Oral BID  . potassium chloride  20 mEq Oral Once  . potassium chloride  40 mEq Oral Daily    Objective: Vital signs in last 24 hours: Temp:  [97.9 F (36.6 C)-98.4 F (36.9 C)] 98.4 F (36.9 C) (05/05 1157) Pulse Rate:  [56-66] 56 (05/05 1157) Resp:  [16-19] 16 (05/05 1157) BP: (116-150)/(67-99) 116/67 mmHg (05/05 1157) SpO2:  [98 %-100 %] 100 % (05/05 1157) Constitutional: oriented to person, place, and time. appears well-developed and well-nourished. No distress. obese HENT: [place]/AT, PERRLA, no scleral  icterus Mouth/Throat: Oropharynx is clear and moist. No oropharyngeal exudate.  Cardiovascular: Normal rate, regular rhythm and normal heart sounds.  Pulmonary/Chest: Effort normal and breath sounds normal. No respiratory distress. has no wheezes.  Neck = supple, no nuchal rigidity Abdominal: Soft. Bowel sounds are normal. exhibits no distension. There is no tenderness.  Lymphadenopathy: no cervical adenopathy. No axillary adenopathy Neurological: alert and oriented to person, place, and time.  Skin: RLE with chronic appearing brawny edema and erythema Ext 3+ RLE edema Psychiatric: a normal mood and affect. behavior is normal  Lab Results  Recent Labs  12/02/15 0505 12/03/15 0505  WBC 16.2* 10.0  HGB 10.3* 9.8*  HCT 31.4* 29.1*  NA 136 138  K 3.9 3.9  CL 105 105  CO2 23 25  BUN 48* 47*  CREATININE 1.96* 1.67*    Microbiology: Results for orders placed or performed during the hospital encounter of 11/29/15  Blood Culture (routine x 2)     Status: None   Collection Time: 11/29/15  9:42 AM  Result Value Ref Range Status   Specimen Description BLOOD LEFT AC  Final   Special Requests   Final    BOTTLES DRAWN AEROBIC AND ANAEROBIC AER 4ML ANA 2ML   Culture NO GROWTH 5 DAYS  Final  Report Status 12/04/2015 FINAL  Final  Blood Culture (routine x 2)     Status: None   Collection Time: 11/29/15  9:44 AM  Result Value Ref Range Status   Specimen Description BLOOD RIGHT AC  Final   Special Requests   Final    BOTTLES DRAWN AEROBIC AND ANAEROBIC ANA 4ML AER 2ML   Culture NO GROWTH 5 DAYS  Final   Report Status 12/04/2015 FINAL  Final  Urine culture     Status: Abnormal   Collection Time: 11/29/15 10:00 AM  Result Value Ref Range Status   Specimen Description URINE, RANDOM  Final   Special Requests NONE  Final   Culture 80,000 COLONIES/mL ESCHERICHIA COLI (A)  Final   Report Status 12/01/2015 FINAL  Final   Organism ID, Bacteria ESCHERICHIA COLI (A)  Final       Susceptibility   Escherichia coli - MIC*    AMPICILLIN <=2 SENSITIVE Sensitive     CEFAZOLIN <=4 SENSITIVE Sensitive     CEFTRIAXONE <=1 SENSITIVE Sensitive     CIPROFLOXACIN <=0.25 SENSITIVE Sensitive     GENTAMICIN <=1 SENSITIVE Sensitive     IMIPENEM <=0.25 SENSITIVE Sensitive     NITROFURANTOIN <=16 SENSITIVE Sensitive     TRIMETH/SULFA <=20 SENSITIVE Sensitive     AMPICILLIN/SULBACTAM <=2 SENSITIVE Sensitive     PIP/TAZO <=4 SENSITIVE Sensitive     Extended ESBL NEGATIVE Sensitive     * 80,000 COLONIES/mL ESCHERICHIA COLI    Studies/Results: Ct Abdomen Pelvis Wo Contrast  12/01/2015  CLINICAL DATA:  Bilateral hydronephrosis.  Acute renal failure. EXAM: CT ABDOMEN AND PELVIS WITHOUT CONTRAST TECHNIQUE: Multidetector CT imaging of the abdomen and pelvis was performed following the standard protocol without IV contrast. COMPARISON:  Renal ultrasound dated 11/30/2015. FINDINGS: Lower chest: Dense consolidation at the right lung base could represent atelectasis, aspiration or pneumonia. Small adjacent pleural effusion. Probable cardiomegaly, incompletely imaged. Hepatobiliary: Gallbladder appears to be filled with stones but there are no adjacent inflammatory changes to suggest acute cholecystitis. No mass or focal lesion identified within the liver on this unenhanced exam. Pancreas: No mass or inflammatory process identified on this un-enhanced exam. Spleen: Within normal limits in size. Adrenals/Urinary Tract: Left adrenal mass measures approximately 1.9 x 1.3 cm with CT density measurements suggesting benign lipid-rich adrenal adenoma. Right adrenal gland appears normal. There is renal pelviectasis bilaterally, versus minimal bilateral hydronephrosis. Left renal cyst measuring 1.9 cm. No hydroureter. No renal or ureteral calculi. Bladder appears normal. No bladder stone or bladder wall thickening. Stomach/Bowel: Bowel is normal in caliber. There is diverticulosis of the sigmoid colon but no  inflammatory changes to suggest acute diverticulitis. No bowel wall thickening or evidence of bowel wall inflammation seen. Appendix is normal. Stomach appears normal. Vascular/Lymphatic: Scattered atherosclerotic changes of the normal- caliber abdominal aorta. No enlarged lymph nodes seen. Reproductive: Status post hysterectomy. Adnexal regions are unremarkable. Other: No free fluid or abscess collections seen. No free intraperitoneal air. Musculoskeletal: Degenerative changes within the lumbar spine. Large degenerative subchondral cysts within the right femoral head. No acute or suspicious osseous lesions seen. Single enlarged lymph node is seen in the right inguinal/groin region measuring 1.7 cm short axis dimension. Scattered additional smaller lymph nodes within the bilateral groin regions. Mild edema within the subcutaneous soft tissues of the right groin (localized cellulitis? ). IMPRESSION: 1. Dense consolidation within the posterior right lower lung which could represent pneumonia, aspiration or atelectasis. Small adjacent pleural effusion. 2. Minimal bilateral hydronephrosis versus pelviectasis. No renal  or ureteral calculi. No ureteral dilatation. Bladder appears normal. 3. Left adrenal mass measuring 1.9 x 1.3 cm, too small to definitively characterize but with CT density measurements suggesting benign adrenal adenoma. Could consider follow-up with adrenal protocol MRI after current issues are resolved. 4. Sigmoid colon diverticulosis without evidence of acute diverticulitis. 5. Cholelithiasis without evidence of acute cholecystitis. 6. Single enlarged lymph node in the right inguinal/groin region measuring 1.7 cm short axis dimension. Several additional smaller lymph nodes within the bilateral groin regions. Perhaps mild edema-like change within the subcutaneous soft tissues of the right groin (localized mild cellulitis?, mild lymphadenitis?). Electronically Signed   By: Franki Cabot M.D.   On:  12/01/2015 19:02   Nm Pulmonary Perf And Vent  12/01/2015  CLINICAL DATA:  Hypoxia, short of breath.  Lower extremity swelling. EXAM: NUCLEAR MEDICINE VENTILATION - PERFUSION LUNG SCAN TECHNIQUE: Ventilation images were obtained in multiple projections using inhaled aerosol Tc-7m DTPA. Perfusion images were obtained in multiple projections after intravenous injection of Tc-36m MAA. RADIOPHARMACEUTICALS:  32.8 mCi Technetium-38m DTPA aerosol inhalation and 4.2 mCi Technetium-39m MAA IV COMPARISON:  Chest radiograph 537 T FINDINGS: Ventilation: No focal ventilation defect. Perfusion: No wedge shaped peripheral perfusion defects to suggest acute pulmonary embolism. Cardiomegaly noted. IMPRESSION: No evidence of pulmonary embolism. Electronically Signed   By: Suzy Bouchard M.D.   On: 12/01/2015 17:05    Assessment/Plan: Patricia GIAMMARINO is a 65 y.o. female with chronic RLE edema now with RLE cellulitis and several small ulcers. Had fever initially but improving. Has UCX with E coli, BCX neg. Doppler negative CT abd pelvis shows enlarged LN in R groin And mild edema like changes in subq tissues of R groin.  Recommendations Continue ancef while inpt- dc on keflex 500 tid for 10 days Will need unawraps - has been seen by vascular Will need HH for unawraps Can fu with vascular surgery for unawrap management  Thank you very much for the consult. Will follow with you.  FITZGERALD, DAVID P   12/03/2015, 4:16 PM

## 2015-12-03 NOTE — Progress Notes (Addendum)
MEDICATION RELATED CONSULT NOTE - Follow Up  Pharmacy Consult for monitoring/recommending change in therapy secondary to drug interactions with amiodarone Indication: New start amiodarone  Allergies  Allergen Reactions  . Contrast Media [Iodinated Diagnostic Agents] Other (See Comments)    Flushed, cough, anxiety, mild less responsiveness.   Patient Measurements: Height: 5\' 6"  (167.6 cm) Weight: 219 lb 4.8 oz (99.474 kg) IBW/kg (Calculated) : 59.3  Vital Signs: Temp: 97.9 F (36.6 C) (05/05 0631) Temp Source: Oral (05/05 0631) BP: 150/80 mmHg (05/05 0631) Pulse Rate: 66 (05/05 0631) Intake/Output from previous day: 05/04 0701 - 05/05 0700 In: 1820.7 [P.O.:480; I.V.:1290.7; IV Piggyback:50] Out: 1450 [id number] Intake/Output from this shift:    Labs:  Recent Labs  11/30/15 1620 12/01/15 0505 12/02/15 0505 12/03/15 0505  WBC  --  14.9* 16.2* 10.0  HGB  --  10.4* 10.3* 9.8*  HCT  --  31.7* 31.4* 29.1*  PLT  --  120* 140* 131*  CREATININE  --  1.67* 1.96* 1.67*  LABCREA 31  --   --   --   MG  --  2.0  --   --   PHOS  --  2.9  --   --    Estimated Creatinine Clearance: 40 mL/min (by C-G formula based on Cr of 1.67).   Microbiology: Recent Results (from the past 720 hour(s))  Blood Culture (routine x 2)     Status: None (Preliminary result)   Collection Time: 11/29/15  9:42 AM  Result Value Ref Range Status   Specimen Description BLOOD LEFT AC  Final   Special Requests   Final    BOTTLES DRAWN AEROBIC AND ANAEROBIC AER 4ML ANA 2ML   Culture NO GROWTH 3 DAYS  Final   Report Status PENDING  Incomplete  Blood Culture (routine x 2)     Status: None (Preliminary result)   Collection Time: 11/29/15  9:44 AM  Result Value Ref Range Status   Specimen Description BLOOD RIGHT AC  Final   Special Requests   Final    BOTTLES DRAWN AEROBIC AND ANAEROBIC ANA 4ML AER 2ML   Culture NO GROWTH 3 DAYS  Final   Report Status PENDING  Incomplete  Urine culture     Status:  Abnormal   Collection Time: 11/29/15 10:00 AM  Result Value Ref Range Status   Specimen Description URINE, RANDOM  Final   Special Requests NONE  Final   Culture 80,000 COLONIES/mL ESCHERICHIA COLI (A)  Final   Report Status 12/01/2015 FINAL  Final   Organism ID, Bacteria ESCHERICHIA COLI (A)  Final      Susceptibility   Escherichia coli - MIC*    AMPICILLIN <=2 SENSITIVE Sensitive     CEFAZOLIN <=4 SENSITIVE Sensitive     CEFTRIAXONE <=1 SENSITIVE Sensitive     CIPROFLOXACIN <=0.25 SENSITIVE Sensitive     GENTAMICIN <=1 SENSITIVE Sensitive     IMIPENEM <=0.25 SENSITIVE Sensitive     NITROFURANTOIN <=16 SENSITIVE Sensitive     TRIMETH/SULFA <=20 SENSITIVE Sensitive     AMPICILLIN/SULBACTAM <=2 SENSITIVE Sensitive     PIP/TAZO <=4 SENSITIVE Sensitive     Extended ESBL NEGATIVE Sensitive     * 80,000 COLONIES/mL ESCHERICHIA COLI    Medications:  Scheduled:  . amiodarone  200 mg Oral Daily  . aspirin  81 mg Oral Daily  . atorvastatin  40 mg Oral q1800  . brimonidine  1 drop Both Eyes BID  .  ceFAZolin (ANCEF) IV  2 g Intravenous  Q8H  . cloNIDine  0.2 mg Oral Daily  . ipratropium-albuterol  3 mL Nebulization Q4H  . latanoprost  1 drop Both Eyes QHS  . metoprolol tartrate  50 mg Oral BID  . potassium chloride  20 mEq Oral Once  . potassium chloride  40 mEq Oral Daily   Infusions:  . sodium chloride 50 mL/hr at 12/02/15 1159  . heparin 1,100 Units/hr (12/02/15 0547)   PRN: acetaminophen **OR** acetaminophen, albuterol, nitroGLYCERIN, ondansetron **OR** ondansetron (ZOFRAN) IV, oxyCODONE  Assessment: Pharmacy consulted to monitor medication list and make recommendations regarding dose adjustments secondary to drug-drug interactions with amiodarone.  1. Amiodarone and ondansetron - risk of prolonged QTc 2. Amiodarone and atorvastatin - risk of increased atorvastatin concentration  Plan:  Patient admitted to telemetry unit and is being monitored.  Recommend close  monitoring of QTc. Ondansetron is ordered PRN for nausea and no doses have been charted at this time.  Monitor LFTs and for myalgia with atorvastatin.  Baseline LFTs normal.  Consider ordering LFTs if patient has symptoms consistent with heptatoxicity (i.e. unusual fatigue or weakness, loss of appetite, abdominal pain, dark-colored urine or yellowing of skin or sclera)   Thank you for the consult.   Kirsta Probert G 12/03/2015,8:04 AM

## 2015-12-03 NOTE — Care Management (Signed)
Noted PT recommendations. Spoke with patient and provided choice of home health agencies. She has no preference. Referral to [facility] for SN and PT.

## 2015-12-03 NOTE — Consult Note (Signed)
[facility]  Date of admission:  11/29/2015  Inpatient day:  12/02/2015  Consulting physician:  Dr Anthonette Legato  Reason for Consultation:  Abnormal UPEP.  Chief Complaint: Patricia Ewing is a 65 y.o. female with hypertension, diabetes, and hyperlipidemia who was admitted from the emergency room with right lower leg cellulitis, sepsis, atrial fibrillation with rapid ventricular response, and acute renal failure.  HPI: The patient notes swelling in her leg and mild erythema for over a month.  She presented to the emergency room secondary to increased pain in her leg as well as dizziness. Evaluation in the emergency room revealed atrial fibrillation with rapid ventricular rate in the 180s. She was noted to have a right lower extremity cellulitis as well as a UTI. She started on vancomycin and Zosyn. Lactic acid was 2.6. Creatinine was 2.09 with a baseline creatinine of 1.2. Troponin was 5.2.  She has undergone imaging studies. Chest x-ray on 11/29/2015 revealed no active disease with borderline cardiomegaly. Right lower extremity duplex on 11/29/2015 revealed no evidence of DVT.  Renal ultrasound on 11/30/2015 revealed mild bilateral hydronephrosis.  VQ scan on 12/01/2015 revealed no evidence of pulmonary embolism.  Abdominal and pelvic CT scan without contrast.on 12/01/2015 noted dense consolidation in the posterior right lower lobe small adjacent effusion, minimal bilateral hydronephrosis versus pelviectasis.  Thre was a 1.9 x 1.3 cm left adrenal mass suggestive of benign adrenal adenoma. There was sigmoid diverticulosis and cholelithiasis.  There was a 1.7 cm right inguinal node with several smaller lymph nodes in the groin bilaterally.  She is noted to have acute renal failure and chronic kidney disease stage III at baseline  proteinuria. Her acute renal failure was felt secondary to concurrent infection as well as use of Aleve and BC powder. As part of her evaluation, she underwent  random urine protein electrophoresis.  She was noted to have 2.9% monoclonal protein.  SPEP was negative.  Labs on admission included a hematocrit of 41.0, hemoglobin 13.5, platelets 140,000, white count 16,800 with an [place] of 16,300. CBC on 12/02/2015 included a hematocrit 31.4, hemoglobin 10.3, platelets 140,000, white count 16,200. Comprehensive metabolic panel included a calcium of 9.2, protein 8.1 and albumin 3.8.  She denies any history of recurrent infections. She states that her diet is good. She eats meat regularly.  She has had ice pica for some time.  She denies any melena or hematochezia. Last colonoscopy was 2 years ago.  Two to three small polyps were found.   Past Medical History  Diagnosis Date  . Hypertension   . Diabetes mellitus     diagnosed at age 35, on a "pill" (?metformin), but now diet-controlled  . Glaucoma   . Hyperlipidemia     managed with lifestyle modification  . Tobacco abuse     No PFT's or diagnosis of COPD    Past Surgical History  Procedure Laterality Date  . Abdominal hysterectomy  1984    Family History  Problem Relation Age of Onset  . Heart attack Father 41  . Lymphoma Son   . Colon cancer Other     Social History:  reports that she has been smoking Cigarettes.  She has a 20 pack-year smoking history. She does not have any smokeless tobacco history on file. She reports that she drinks about 0.5 - 1.0 oz of alcohol per week. She reports that she does not use illicit drugs. The patient has smoked 1/2 pack a day since the age of 42. She  is retired. She previously worked for the [employer]. She is alone today.  Allergies:  Allergies  Allergen Reactions  . Contrast Media [Iodinated Diagnostic Agents] Other (See Comments)    Flushed, cough, anxiety, mild less responsiveness.    Medications Prior to Admission  Medication Sig Dispense Refill  . albuterol (PROVENTIL HFA;VENTOLIN HFA) 108 (90 BASE) MCG/ACT inhaler Inhale 1 puff into the  lungs every 6 (six) hours as needed. Shortness of breath    . aspirin 81 MG tablet Take 81 mg by mouth daily.    . brimonidine (ALPHAGAN) 0.2 % ophthalmic solution Place 1 drop into both eyes 2 (two) times daily.    . cetirizine (ZYRTEC) 10 MG tablet Take 10 mg by mouth daily as needed. allergies    . cloNIDine (CATAPRES) 0.2 MG tablet Take 0.2 mg by mouth daily.    . furosemide (LASIX) 40 MG tablet Take 40 mg by mouth 2 (two) times daily.  3  . latanoprost (XALATAN) 0.005 % ophthalmic solution Place 1 drop into both eyes.    . potassium chloride SA (K-DUR,KLOR-CON) 20 MEQ tablet Take 20 mEq by mouth daily.      Review of Systems: GENERAL:  Feels "ok".  Fever at home prior to admission (none now).  No sweats or weight loss. PERFORMANCE STATUS (ECOG):  1-2 HEENT:  Allergies.  No visual changes, runny nose, sore throat, mouth sores or tenderness. Lungs: Little cough.  No shortness of breath.  No hemoptysis. Cardiac:  No chest pain, palpitations, orthopnea, or PND. GI:  No nausea, vomiting, diarrhea, constipation, melena or hematochezia. GU:  No urgency, frequency, dysuria, or hematuria. Musculoskeletal:  No back pain.  No joint pain.  No muscle tenderness. Extremities:  No pain and swelling in right lower extremity. Skin:  Right leg eythema.. Neuro:  No headache, numbness or weakness, balance or coordination issues. Endocrine:  Diabetes.  No thyroid issues, hot flashes or night sweats. Psych:  No mood changes, depression or anxiety. Pain:  Right leg pain. Review of systems:  All other systems reviewed and found to be negative.  Physical Exam:  Blood pressure 144/83, pulse 65, temperature 98.4 F (36.9 C), temperature source Oral, resp. rate 19, height _0  (1.676 m), weight 219 lb 4.8 oz (99.474 kg), SpO2 98 %.  GENERAL:  Well developed, well nourished, woman sitting comfortably on the medical unit in no acute distress. MENTAL STATUS:  Alert and oriented to person, place and  time. HEAD:  Short graying hair.  Normocephalic, atraumatic, face symmetric, no Cushingoid features. EYES:  Brown eyes.  Pupils equal round and reactive to light and accomodation.  No conjunctivitis or scleral icterus. ENT:  [place] in place.  Oropharynx clear without lesion.  Tongue normal. Mucous membranes moist.  RESPIRATORY:  Clear to auscultation without rales, wheezes or rhonchi. CARDIOVASCULAR:  Regular rate and rhythm without murmur, rub or gallop. ABDOMEN:  Soft, non-tender, with active bowel sounds, and no appreciable hepatosplenomegaly.  No masses. SKIN:  No rashes, ulcers or lesions. EXTREMITIES: Chronic bilateral lower extremity changes with increased edema and erythema right lower extremity with edges outlined (no extension).  Right leg tender.  No palpable cords. LYMPH NODES: Small right inguial node.  No palpable cervical, supraclavicular, or axillary adenopathy  NEUROLOGICAL: Unremarkable. PSYCH:  Appropriate.  Results for orders placed or performed during the hospital encounter of 11/29/15 (from the past 48 hour(s))  Basic metabolic panel     Status: Abnormal   Collection Time: 12/01/15  5:05 AM  Result Value Ref Range   Sodium 141 135 - 145 mmol/L   Potassium 3.2 (L) 3.5 - 5.1 mmol/L   Chloride 107 101 - 111 mmol/L   CO2 26 22 - 32 mmol/L   Glucose, Bld 189 (H) 65 - 99 mg/dL   BUN 39 (H) 6 - 20 mg/dL   Creatinine, Ser 1.67 (H) 0.44 - 1.00 mg/dL   Calcium 8.7 (L) 8.9 - 10.3 mg/dL   GFR calc non Af Amer 31 (L) >60 mL/min   GFR calc Af Amer 36 (L) >60 mL/min    Comment: (NOTE) The eGFR has been calculated using the CKD EPI equation. This calculation has not been validated in all clinical situations. eGFR's persistently <60 mL/min signify possible Chronic Kidney Disease.    Anion gap 8 5 - 15  Heparin level (unfractionated)     Status: None   Collection Time: 12/01/15  5:05 AM  Result Value Ref Range   Heparin Unfractionated 0.47 0.30 - 0.70 IU/mL    Comment:         IF HEPARIN RESULTS ARE BELOW EXPECTED VALUES, AND PATIENT DOSAGE HAS BEEN CONFIRMED, SUGGEST FOLLOW UP TESTING OF ANTITHROMBIN III LEVELS.   CBC     Status: Abnormal   Collection Time: 12/01/15  5:05 AM  Result Value Ref Range   WBC 14.9 (H) 3.6 - 11.0 K/uL   RBC 3.54 (L) 3.80 - 5.20 MIL/uL   Hemoglobin 10.4 (L) 12.0 - 16.0 g/dL   HCT 31.7 (L) 35.0 - 47.0 %   MCV 89.6 80.0 - 100.0 fL   MCH 29.6 26.0 - 34.0 pg   MCHC 33.0 32.0 - 36.0 g/dL   RDW 15.5 (H) 11.5 - 14.5 %   Platelets 120 (L) 150 - 440 K/uL  Magnesium     Status: None   Collection Time: 12/01/15  5:05 AM  Result Value Ref Range   Magnesium 2.0 1.7 - 2.4 mg/dL  Phosphorus     Status: None   Collection Time: 12/01/15  5:05 AM  Result Value Ref Range   Phosphorus 2.9 2.5 - 4.6 mg/dL  Potassium     Status: Abnormal   Collection Time: 12/01/15  7:18 PM  Result Value Ref Range   Potassium 3.4 (L) 3.5 - 5.1 mmol/L  Basic metabolic panel     Status: Abnormal   Collection Time: 12/02/15  5:05 AM  Result Value Ref Range   Sodium 136 135 - 145 mmol/L   Potassium 3.9 3.5 - 5.1 mmol/L   Chloride 105 101 - 111 mmol/L   CO2 23 22 - 32 mmol/L   Glucose, Bld 208 (H) 65 - 99 mg/dL   BUN 48 (H) 6 - 20 mg/dL   Creatinine, Ser 1.96 (H) 0.44 - 1.00 mg/dL   Calcium 8.6 (L) 8.9 - 10.3 mg/dL   GFR calc non Af Amer 26 (L) >60 mL/min   GFR calc Af Amer 30 (L) >60 mL/min    Comment: (NOTE) The eGFR has been calculated using the CKD EPI equation. This calculation has not been validated in all clinical situations. eGFR's persistently <60 mL/min signify possible Chronic Kidney Disease.    Anion gap 8 5 - 15  Heparin level (unfractionated)     Status: None   Collection Time: 12/02/15  5:05 AM  Result Value Ref Range   Heparin Unfractionated 0.50 0.30 - 0.70 IU/mL    Comment:        IF HEPARIN RESULTS ARE BELOW EXPECTED VALUES, AND  PATIENT DOSAGE HAS BEEN CONFIRMED, SUGGEST FOLLOW UP TESTING OF ANTITHROMBIN III LEVELS.   CBC      Status: Abnormal   Collection Time: 12/02/15  5:05 AM  Result Value Ref Range   WBC 16.2 (H) 3.6 - 11.0 K/uL   RBC 3.50 (L) 3.80 - 5.20 MIL/uL   Hemoglobin 10.3 (L) 12.0 - 16.0 g/dL   HCT 31.4 (L) 35.0 - 47.0 %   MCV 90.0 80.0 - 100.0 fL   MCH 29.5 26.0 - 34.0 pg   MCHC 32.8 32.0 - 36.0 g/dL   RDW 15.9 (H) 11.5 - 14.5 %   Platelets 140 (L) 150 - 440 K/uL  Glucose, capillary     Status: Abnormal   Collection Time: 12/02/15  8:46 PM  Result Value Ref Range   Glucose-Capillary 157 (H) 65 - 99 mg/dL   Ct Abdomen Pelvis Wo Contrast  12/01/2015  CLINICAL DATA:  Bilateral hydronephrosis.  Acute renal failure. EXAM: CT ABDOMEN AND PELVIS WITHOUT CONTRAST TECHNIQUE: Multidetector CT imaging of the abdomen and pelvis was performed following the standard protocol without IV contrast. COMPARISON:  Renal ultrasound dated 11/30/2015. FINDINGS: Lower chest: Dense consolidation at the right lung base could represent atelectasis, aspiration or pneumonia. Small adjacent pleural effusion. Probable cardiomegaly, incompletely imaged. Hepatobiliary: Gallbladder appears to be filled with stones but there are no adjacent inflammatory changes to suggest acute cholecystitis. No mass or focal lesion identified within the liver on this unenhanced exam. Pancreas: No mass or inflammatory process identified on this un-enhanced exam. Spleen: Within normal limits in size. Adrenals/Urinary Tract: Left adrenal mass measures approximately 1.9 x 1.3 cm with CT density measurements suggesting benign lipid-rich adrenal adenoma. Right adrenal gland appears normal. There is renal pelviectasis bilaterally, versus minimal bilateral hydronephrosis. Left renal cyst measuring 1.9 cm. No hydroureter. No renal or ureteral calculi. Bladder appears normal. No bladder stone or bladder wall thickening. Stomach/Bowel: Bowel is normal in caliber. There is diverticulosis of the sigmoid colon but no inflammatory changes to suggest acute diverticulitis.  No bowel wall thickening or evidence of bowel wall inflammation seen. Appendix is normal. Stomach appears normal. Vascular/Lymphatic: Scattered atherosclerotic changes of the normal- caliber abdominal aorta. No enlarged lymph nodes seen. Reproductive: Status post hysterectomy. Adnexal regions are unremarkable. Other: No free fluid or abscess collections seen. No free intraperitoneal air. Musculoskeletal: Degenerative changes within the lumbar spine. Large degenerative subchondral cysts within the right femoral head. No acute or suspicious osseous lesions seen. Single enlarged lymph node is seen in the right inguinal/groin region measuring 1.7 cm short axis dimension. Scattered additional smaller lymph nodes within the bilateral groin regions. Mild edema within the subcutaneous soft tissues of the right groin (localized cellulitis? ). IMPRESSION: 1. Dense consolidation within the posterior right lower lung which could represent pneumonia, aspiration or atelectasis. Small adjacent pleural effusion. 2. Minimal bilateral hydronephrosis versus pelviectasis. No renal or ureteral calculi. No ureteral dilatation. Bladder appears normal. 3. Left adrenal mass measuring 1.9 x 1.3 cm, too small to definitively characterize but with CT density measurements suggesting benign adrenal adenoma. Could consider follow-up with adrenal protocol MRI after current issues are resolved. 4. Sigmoid colon diverticulosis without evidence of acute diverticulitis. 5. Cholelithiasis without evidence of acute cholecystitis. 6. Single enlarged lymph node in the right inguinal/groin region measuring 1.7 cm short axis dimension. Several additional smaller lymph nodes within the bilateral groin regions. Perhaps mild edema-like change within the subcutaneous soft tissues of the right groin (localized mild cellulitis?, mild lymphadenitis?). Electronically Signed  By: Franki Cabot M.D.   On: 12/01/2015 19:02   Dg Chest 1 View  12/01/2015  CLINICAL  DATA:  Hypoxia.  Sepsis. EXAM: CHEST 1 VIEW COMPARISON:  11/30/2015 FINDINGS: Moderate cardiomegaly stable. Both lungs are clear. Previously seen right infrahilar opacity is no longer visualized on today's exam. No evidence of pleural effusion or pneumothorax. IMPRESSION: Cardiomegaly.  No active lung disease seen on today's exam. Electronically Signed   By: Earle Gell M.D.   On: 12/01/2015 12:05   Nm Pulmonary Perf And Vent  12/01/2015  CLINICAL DATA:  Hypoxia, short of breath.  Lower extremity swelling. EXAM: NUCLEAR MEDICINE VENTILATION - PERFUSION LUNG SCAN TECHNIQUE: Ventilation images were obtained in multiple projections using inhaled aerosol [id number]. Perfusion images were obtained in multiple projections after intravenous injection of Tc-963mAA. RADIOPHARMACEUTICALS:  32.8 mCi Technetium-9942mPA aerosol inhalation and 4.2 mCi Technetium-53m46m IV COMPARISON:  Chest radiograph 537 T FINDINGS: Ventilation: No focal ventilation defect. Perfusion: No wedge shaped peripheral perfusion defects to suggest acute pulmonary embolism. Cardiomegaly noted. IMPRESSION: No evidence of pulmonary embolism. Electronically Signed   By: StewSuzy Bouchard.   On: 12/01/2015 17:05    Assessment:  The patient is a 64 y12. woman with hypertension, diabetes, and hyperlipidemia who was admitted from the emergency room with right lower leg cellulitis, sepsis, atrial fibrillation with rapid ventricular response, and acute renal failure.  Her renal failure is noted to be in the setting of infection and well as use of Aleve and BC powder. Random urine protein electrophoresis revealed a 2.9% monoclonal protein.  SPEP was negative.  She has a normocytic anemia as well as mild thrombocytopenia.  Albumen, protein, and calcium are normal.  She denies any history of recurrent infections. She denies any bone pain.  Diet is good. She has had ice pica for some time.  She denies any melena or hematochezia. Last colonoscopy was 2  years ago.   Plan:   1.  Hematology/Oncology:  Unclear significance of small amount of monoclonal protein noted on random UPEP.  Additional labs ordered including a 24 hour urine for UPEP, immunofixation, serum immunoglobulins and free light chains.  Mild thrombocytopenia likely secondary to infection.  Anemia likely mult-ifactorial.  Additional labs ordered.  Right inguinal note reactive secondary to cellulitis.   Thank you for allowing me to participate in [facility].  I will follow her closely with you while hospitalized and after discharge in the outpatient department.  Patricia Ewing  12/02/2015

## 2015-12-03 NOTE — Progress Notes (Signed)
ANTICOAGULATION CONSULT NOTE - Initial Consult  Pharmacy Consult for heparin drip Indication: atrial fibrillation  Allergies  Allergen Reactions  . Contrast Media [Iodinated Diagnostic Agents] Other (See Comments)    Flushed, cough, anxiety, mild less responsiveness.   Patient Measurements: Height: 5\' 6"  (167.6 cm) Weight: 219 lb 4.8 oz (99.474 kg) IBW/kg (Calculated) : 59.3 Heparin Dosing Weight: 81.1 kg  Vital Signs: Temp: 98.4 F (36.9 C) (05/04 1939) Temp Source: Oral (05/04 1939) BP: 144/83 mmHg (05/04 1939) Pulse Rate: 65 (05/04 1939)  Labs:  Recent Labs  12/01/15 0505 12/02/15 0505 12/03/15 0505  HGB 10.4* 10.3* 9.8*  HCT 31.7* 31.4* 29.1*  PLT 120* 140* 131*  HEPARINUNFRC 0.47 0.50 0.54  CREATININE 1.67* 1.96*  --    Estimated Creatinine Clearance: 34.1 mL/min (by C-G formula based on Cr of 1.96).  Medical History: Past Medical History  Diagnosis Date  . Hypertension   . Diabetes mellitus     diagnosed at age 50, on a "pill" (?metformin), but now diet-controlled  . Glaucoma   . Hyperlipidemia     managed with lifestyle modification  . Tobacco abuse     No PFT's or diagnosis of COPD   Assessment: Pharmacy consulted to dose and monitor heparin drip in this 65 year old female for new onset atrial fibrillation. Patient was not taking any anticoagulants prior to admission per med rec. Baseline labs were obtained in the ED.  Hgb 13.5 Plt 140  INR 1.47 Troponin 5.24  APTT 33  Goal of Therapy:  Heparin level 0.3-0.7 units/ml Monitor platelets by anticoagulation protocol: Yes   Plan:  Give 4050 units bolus x 1 (~50 units/kg) Start heparin infusion at 1100 units/hr (~14 units/kg/hr) Check anti-Xa level in 6 hours and daily while on heparin Continue to monitor H&H and platelets  5/01:  HL @ 19:46 = 0.43.  Will continue this pt on current rate of 1100 units/hr and recheck HL on 5/2 @ 2:00.   5/2 02:00 heparin level 0.34. Continue current regimen. Recheck  heparin level and CBC with tomorrow AM labs.  5/3 AM heparin level 0.47. Continue current regimen. Recheck heparin level and CBC with tomorrow AM labs.  5/4 AM heparin level 0.50. Continue current regimen. Recheck heparin level and CBC with tomorrow AM labs.  5/5 AM heparin level 0.54. Continue current regimen. Recheck heparin level and CBC with tomorrow AM labs.   Thank you for the consult.  Eloise Harman, PharmD Clinical Pharmacist 12/03/2015,5:48 AM

## 2015-12-03 NOTE — Progress Notes (Signed)
[facility]  ROUNDING NOTE   Subjective:  Creatinine has come down slightly to 1.6. The patient appears to be tolerating IV fluid hydration fine. WBC count has also come down to 10.  Objective:  Vital signs in last 24 hours:  Temp:  [97.9 F (36.6 C)-98.4 F (36.9 C)] 97.9 F (36.6 C) (05/05 0631) Pulse Rate:  [65-73] 65 (05/05 1002) Resp:  [18-19] 18 (05/05 0631) BP: (144-173)/(80-99) 150/99 mmHg (05/05 1002) SpO2:  [98 %-100 %] 98 % (05/05 0743)  Weight change:  Filed Weights   11/29/15 0935 11/29/15 1725  Weight: 97.523 kg (215 lb) 99.474 kg (219 lb 4.8 oz)    Intake/Output: I/O last 3 completed shifts: In: 1820.7 [P.O.:480; I.V.:1290.7; IV Piggyback:50] Out: 2050 [Urine:2050]   Intake/Output this shift:     Physical Exam: General: NAD, resting in bed  Head: Normocephalic, atraumatic. Moist oral mucosal membranes  Eyes: Anicteric  Neck: Supple, trachea midline  Lungs:  Clear to auscultation normal effort  Heart: Regular rate and rhythm no rubs  Abdomen:  Soft, nontender, BS present  Extremities: Extensive cellulitis of the RLE, multiple excoriations of RLE  Neurologic: Nonfocal, moving all four extremities  Skin: Excoriations b/l LE's, cellulitis of RLE       Basic Metabolic Panel:  Recent Labs Lab 11/29/15 0937 11/29/15 1946 11/30/15 0538 12/01/15 0505 12/01/15 1918 12/02/15 0505 12/03/15 0505  NA 144  --  141 141  --  136 138  K 3.2*  --  3.4* 3.2* 3.4* 3.9 3.9  CL 104  --  108 107  --  105 105  CO2 23  --  24 26  --  23 25  GLUCOSE 124*  --  135* 189*  --  208* 129*  BUN 40*  --  40* 39*  --  48* 47*  CREATININE 2.01*  --  1.86* 1.67*  --  1.96* 1.67*  CALCIUM 9.2  --  8.4* 8.7*  --  8.6* 8.5*  MG  --  2.0 2.0 2.0  --   --   --   PHOS  --   --  3.4 2.9  --   --   --     Liver Function Tests:  Recent Labs Lab 11/29/15 0937  AST 80*  ALT 24  ALKPHOS 64  BILITOT 1.1  PROT 8.1  ALBUMIN 3.8    Recent Labs Lab  11/29/15 0937  LIPASE 12   No results for input(s): AMMONIA in the last 168 hours.  CBC:  Recent Labs Lab 11/29/15 0937 11/30/15 0538 12/01/15 0505 12/02/15 0505 12/03/15 0505  WBC 16.8* 11.8* 14.9* 16.2* 10.0  NEUTROABS 16.3* 11.0*  --   --   --   HGB 13.5 11.3* 10.4* 10.3* 9.8*  HCT 41.0 34.5* 31.7* 31.4* 29.1*  MCV 90.0 92.1 89.6 90.0 90.6  PLT 140* 117* 120* 140* 131*    Cardiac Enzymes:  Recent Labs Lab 11/29/15 0937 11/29/15 1935 11/29/15 2313 11/30/15 0538  TROPONINI 5.24* 6.47* 6.01* 4.98*    BNP: Invalid input(s): POCBNP  CBG:  Recent Labs Lab 12/02/15 2046  GLUCAP 157*    Microbiology: Results for orders placed or performed during the hospital encounter of 11/29/15  Blood Culture (routine x 2)     Status: None (Preliminary result)   Collection Time: 11/29/15  9:42 AM  Result Value Ref Range Status   Specimen Description BLOOD LEFT [facility]  Final   Special Requests   Final    BOTTLES DRAWN  AEROBIC AND ANAEROBIC AER 4ML ANA 2ML   Culture NO GROWTH 4 DAYS  Final   Report Status PENDING  Incomplete  Blood Culture (routine x 2)     Status: None (Preliminary result)   Collection Time: 11/29/15  9:44 AM  Result Value Ref Range Status   Specimen Description BLOOD RIGHT AC  Final   Special Requests   Final    BOTTLES DRAWN AEROBIC AND ANAEROBIC ANA 4ML AER 2ML   Culture NO GROWTH 4 DAYS  Final   Report Status PENDING  Incomplete  Urine culture     Status: Abnormal   Collection Time: 11/29/15 10:00 AM  Result Value Ref Range Status   Specimen Description URINE, RANDOM  Final   Special Requests NONE  Final   Culture 80,000 COLONIES/mL ESCHERICHIA COLI (A)  Final   Report Status 12/01/2015 FINAL  Final   Organism ID, Bacteria ESCHERICHIA COLI (A)  Final      Susceptibility   Escherichia coli - MIC*    AMPICILLIN <=2 SENSITIVE Sensitive     CEFAZOLIN <=4 SENSITIVE Sensitive     CEFTRIAXONE <=1 SENSITIVE Sensitive     CIPROFLOXACIN <=0.25 SENSITIVE  Sensitive     GENTAMICIN <=1 SENSITIVE Sensitive     IMIPENEM <=0.25 SENSITIVE Sensitive     NITROFURANTOIN <=16 SENSITIVE Sensitive     TRIMETH/SULFA <=20 SENSITIVE Sensitive     AMPICILLIN/SULBACTAM <=2 SENSITIVE Sensitive     PIP/TAZO <=4 SENSITIVE Sensitive     Extended ESBL NEGATIVE Sensitive     * 80,000 COLONIES/mL ESCHERICHIA COLI    Coagulation Studies: No results for input(s): LABPROT, INR in the last 72 hours.  Urinalysis: No results for input(s): COLORURINE, LABSPEC, PHURINE, GLUCOSEU, HGBUR, BILIRUBINUR, KETONESUR, PROTEINUR, UROBILINOGEN, NITRITE, LEUKOCYTESUR in the last 72 hours.  Invalid input(s): APPERANCEUR    Imaging: Ct Abdomen Pelvis Wo Contrast  12/01/2015  CLINICAL DATA:  Bilateral hydronephrosis.  Acute renal failure. EXAM: CT ABDOMEN AND PELVIS WITHOUT CONTRAST TECHNIQUE: Multidetector CT imaging of the abdomen and pelvis was performed following the standard protocol without IV contrast. COMPARISON:  Renal ultrasound dated 11/30/2015. FINDINGS: Lower chest: Dense consolidation at the right lung base could represent atelectasis, aspiration or pneumonia. Small adjacent pleural effusion. Probable cardiomegaly, incompletely imaged. Hepatobiliary: Gallbladder appears to be filled with stones but there are no adjacent inflammatory changes to suggest acute cholecystitis. No mass or focal lesion identified within the liver on this unenhanced exam. Pancreas: No mass or inflammatory process identified on this un-enhanced exam. Spleen: Within normal limits in size. Adrenals/Urinary Tract: Left adrenal mass measures approximately 1.9 x 1.3 cm with CT density measurements suggesting benign lipid-rich adrenal adenoma. Right adrenal gland appears normal. There is renal pelviectasis bilaterally, versus minimal bilateral hydronephrosis. Left renal cyst measuring 1.9 cm. No hydroureter. No renal or ureteral calculi. Bladder appears normal. No bladder stone or bladder wall thickening.  Stomach/Bowel: Bowel is normal in caliber. There is diverticulosis of the sigmoid colon but no inflammatory changes to suggest acute diverticulitis. No bowel wall thickening or evidence of bowel wall inflammation seen. Appendix is normal. Stomach appears normal. Vascular/Lymphatic: Scattered atherosclerotic changes of the normal- caliber abdominal aorta. No enlarged lymph nodes seen. Reproductive: Status post hysterectomy. Adnexal regions are unremarkable. Other: No free fluid or abscess collections seen. No free intraperitoneal air. Musculoskeletal: Degenerative changes within the lumbar spine. Large degenerative subchondral cysts within the right femoral head. No acute or suspicious osseous lesions seen. Single enlarged lymph node is seen in the right inguinal/groin  region measuring 1.7 cm short axis dimension. Scattered additional smaller lymph nodes within the bilateral groin regions. Mild edema within the subcutaneous soft tissues of the right groin (localized cellulitis? ). IMPRESSION: 1. Dense consolidation within the posterior right lower lung which could represent pneumonia, aspiration or atelectasis. Small adjacent pleural effusion. 2. Minimal bilateral hydronephrosis versus pelviectasis. No renal or ureteral calculi. No ureteral dilatation. Bladder appears normal. 3. Left adrenal mass measuring 1.9 x 1.3 cm, too small to definitively characterize but with CT density measurements suggesting benign adrenal adenoma. Could consider follow-up with adrenal protocol MRI after current issues are resolved. 4. Sigmoid colon diverticulosis without evidence of acute diverticulitis. 5. Cholelithiasis without evidence of acute cholecystitis. 6. Single enlarged lymph node in the right inguinal/groin region measuring 1.7 cm short axis dimension. Several additional smaller lymph nodes within the bilateral groin regions. Perhaps mild edema-like change within the subcutaneous soft tissues of the right groin (localized mild  cellulitis?, mild lymphadenitis?). Electronically Signed   By: Franki Cabot M.D.   On: 12/01/2015 19:02   Nm Pulmonary Perf And Vent  12/01/2015  CLINICAL DATA:  Hypoxia, short of breath.  Lower extremity swelling. EXAM: NUCLEAR MEDICINE VENTILATION - PERFUSION LUNG SCAN TECHNIQUE: Ventilation images were obtained in multiple projections using inhaled aerosol Tc-71m DTPA. Perfusion images were obtained in multiple projections after intravenous injection of Tc-56m MAA. RADIOPHARMACEUTICALS:  32.8 mCi Technetium-61m DTPA aerosol inhalation and 4.2 mCi Technetium-39m MAA IV COMPARISON:  Chest radiograph 537 T FINDINGS: Ventilation: No focal ventilation defect. Perfusion: No wedge shaped peripheral perfusion defects to suggest acute pulmonary embolism. Cardiomegaly noted. IMPRESSION: No evidence of pulmonary embolism. Electronically Signed   By: Suzy Bouchard M.D.   On: 12/01/2015 17:05     Medications:   . sodium chloride 50 mL/hr at 12/02/15 1159  . heparin 1,100 Units/hr (12/02/15 0547)   . amiodarone  200 mg Oral Daily  . aspirin  81 mg Oral Daily  . atorvastatin  40 mg Oral q1800  . brimonidine  1 drop Both Eyes BID  .  ceFAZolin (ANCEF) IV  2 g Intravenous Q8H  . cloNIDine  0.2 mg Oral Daily  . ipratropium-albuterol  3 mL Nebulization Q4H  . latanoprost  1 drop Both Eyes QHS  . metoprolol tartrate  50 mg Oral BID  . potassium chloride  20 mEq Oral Once  . potassium chloride  40 mEq Oral Daily   acetaminophen **OR** acetaminophen, albuterol, nitroGLYCERIN, ondansetron **OR** ondansetron (ZOFRAN) IV, oxyCODONE  Assessment/ Plan:  65 y.o. female with a PMHX of with a PMHx of hypertension, diabetes mellitus type 2, glaucoma, hyperlipidemia, history of tobacco abuse, who was admitted to [facility] on 11/29/2015 for evaluation of Cellulitis of the right lower extremity as well as malaise. We were asked to see her for evaluation management of acute renal failure in this setting. The patient's  baseline creatinine appears to be 1.2.   1. Acute renal failure/chronic kidney disease stage III baseline creatinine 1.2/diabetes mellitus type 2 with chronic kidney disease/proteinuria. Acute renal failure secondary to concurrent infection as well as use of Aleve and BC powder.  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  - patient was thought to have extrarenal pelvis based upon urology evaluation.  Appreciate their assistance. Creatinine has improved with hydration down to 1.6.  Therefore we will continue IV fluid hydration for now.  Follow serum creatinine daily.  2. Right lower extremity cellulitis. patient switched to cefazolin for treatment of her lower extremity cellulitis.  3. Hypertension. Blood pressure 150/99.  Continue metoprolol for now.  Patient started on clonidine for blood pressure control.  4.  Hypokalemia.  Potassium stable at 3.9.  Continue to monitor.  5. Abnormal UPEP/? Anemia of CKD.  Appreciate Dr. Kem Parkinson input.  Further workup has been ordered.   LOS: 4 Fidelis Loth 5/5/201711:32 AM

## 2015-12-03 NOTE — Progress Notes (Signed)
[facility] [place] NAME: Patricia Ewing    MR#:  [id number]  DATE OF BIRTH:  [birth year]  SUBJECTIVE:   Breathing much improved. Patient is now on 2 L of oxygen sats are around 99 -100%. Right lower extremity swelling present. Patient has kept it elevated with pillows underneath.  REVIEW OF SYSTEMS:    Review of Systems  Constitutional: Negative for fever, chills and malaise/fatigue.  HENT: Negative for ear discharge, ear pain, hearing loss, nosebleeds and sore throat.   Eyes: Negative for blurred vision and pain.  Respiratory: Negative for cough, hemoptysis, shortness of breath and wheezing.   Cardiovascular: Negative for chest pain, palpitations and leg swelling.  Gastrointestinal: Negative for nausea, vomiting, abdominal pain, diarrhea and blood in stool.  Genitourinary: Negative for dysuria.  Musculoskeletal: Negative for back pain.  Skin:       RLE redness  Neurological: Negative for dizziness, tremors, speech change, focal weakness, seizures and headaches.  Endo/Heme/Allergies: Does not bruise/bleed easily.  Psychiatric/Behavioral: Negative for depression, suicidal ideas and hallucinations.   Tolerating Diet:yes  DRUG ALLERGIES:   Allergies  Allergen Reactions  . Contrast Media [Iodinated Diagnostic Agents] Other (See Comments)    Flushed, cough, anxiety, mild less responsiveness.    VITALS:  Blood pressure 150/80, pulse 66, temperature 97.9 F (36.6 C), temperature source Oral, resp. rate 18, height 5\' 6"  (1.676 m), weight 99.474 kg (219 lb 4.8 oz), SpO2 100 %.  PHYSICAL EXAMINATION:   Physical Exam  Constitutional: She is oriented to person, place, and time and well-developed, well-nourished, and in no distress. No distress.  HENT:  Head: Normocephalic.  Eyes: No scleral icterus.  Neck: Normal range of motion. Neck supple. No JVD present. No tracheal deviation present.  Cardiovascular: Normal rate, regular rhythm and  normal heart sounds.  Exam reveals no gallop and no friction rub.   No murmur heard. Pulmonary/Chest: Effort normal. No respiratory distress. She has wheezes. She has no rales. She exhibits no tenderness.  Abdominal: Soft. Bowel sounds are normal. She exhibits no distension and no mass. There is no tenderness. There is no rebound and no guarding.  Musculoskeletal: Normal range of motion. She exhibits no edema.  Neurological: She is alert and oriented to person, place, and time.  Skin: Skin is warm. There is erythema.  extensive RLE cellulitis  Psychiatric: Affect and judgment normal.      LABORATORY PANEL:   CBC  Recent Labs Lab 12/03/15 0505  WBC 10.0  HGB 9.8*  HCT 29.1*  PLT 131*   ------------------------------------------------------------------------------------------------------------------  Chemistries   Recent Labs Lab 11/29/15 0937  12/01/15 0505  12/03/15 0505  NA 144  < > 141  < > 138  K 3.2*  < > 3.2*  < > 3.9  CL 104  < > 107  < > 105  CO2 23  < > 26  < > 25  GLUCOSE 124*  < > 189*  < > 129*  BUN 40*  < > 39*  < > 47*  CREATININE 2.01*  < > 1.67*  < > 1.67*  CALCIUM 9.2  < > 8.7*  < > 8.5*  MG  --   < > 2.0  --   --   AST 80*  --   --   --   --   ALT 24  --   --   --   --   ALKPHOS 64  --   --   --   --  BILITOT 1.1  --   --   --   --   < > = values in this interval not displayed. ------------------------------------------------------------------------------------------------------------------  Cardiac Enzymes  Recent Labs Lab 11/29/15 1935 11/29/15 2313 11/30/15 0538  TROPONINI 6.47* 6.01* 4.98*   ------------------------------------------------------------------------------------------------------------------  RADIOLOGY:  Ct Abdomen Pelvis Wo Contrast  12/01/2015  CLINICAL DATA:  Bilateral hydronephrosis.  Acute renal failure. EXAM: CT ABDOMEN AND PELVIS WITHOUT CONTRAST TECHNIQUE: Multidetector CT imaging of the abdomen and pelvis was  performed following the standard protocol without IV contrast. COMPARISON:  Renal ultrasound dated 11/30/2015. FINDINGS: Lower chest: Dense consolidation at the right lung base could represent atelectasis, aspiration or pneumonia. Small adjacent pleural effusion. Probable cardiomegaly, incompletely imaged. Hepatobiliary: Gallbladder appears to be filled with stones but there are no adjacent inflammatory changes to suggest acute cholecystitis. No mass or focal lesion identified within the liver on this unenhanced exam. Pancreas: No mass or inflammatory process identified on this un-enhanced exam. Spleen: Within normal limits in size. Adrenals/Urinary Tract: Left adrenal mass measures approximately 1.9 x 1.3 cm with CT density measurements suggesting benign lipid-rich adrenal adenoma. Right adrenal gland appears normal. There is renal pelviectasis bilaterally, versus minimal bilateral hydronephrosis. Left renal cyst measuring 1.9 cm. No hydroureter. No renal or ureteral calculi. Bladder appears normal. No bladder stone or bladder wall thickening. Stomach/Bowel: Bowel is normal in caliber. There is diverticulosis of the sigmoid colon but no inflammatory changes to suggest acute diverticulitis. No bowel wall thickening or evidence of bowel wall inflammation seen. Appendix is normal. Stomach appears normal. Vascular/Lymphatic: Scattered atherosclerotic changes of the normal- caliber abdominal aorta. No enlarged lymph nodes seen. Reproductive: Status post hysterectomy. Adnexal regions are unremarkable. Other: No free fluid or abscess collections seen. No free intraperitoneal air. Musculoskeletal: Degenerative changes within the lumbar spine. Large degenerative subchondral cysts within the right femoral head. No acute or suspicious osseous lesions seen. Single enlarged lymph node is seen in the right inguinal/groin region measuring 1.7 cm short axis dimension. Scattered additional smaller lymph nodes within the bilateral  groin regions. Mild edema within the subcutaneous soft tissues of the right groin (localized cellulitis? ). IMPRESSION: 1. Dense consolidation within the posterior right lower lung which could represent pneumonia, aspiration or atelectasis. Small adjacent pleural effusion. 2. Minimal bilateral hydronephrosis versus pelviectasis. No renal or ureteral calculi. No ureteral dilatation. Bladder appears normal. 3. Left adrenal mass measuring 1.9 x 1.3 cm, too small to definitively characterize but with CT density measurements suggesting benign adrenal adenoma. Could consider follow-up with adrenal protocol MRI after current issues are resolved. 4. Sigmoid colon diverticulosis without evidence of acute diverticulitis. 5. Cholelithiasis without evidence of acute cholecystitis. 6. Single enlarged lymph node in the right inguinal/groin region measuring 1.7 cm short axis dimension. Several additional smaller lymph nodes within the bilateral groin regions. Perhaps mild edema-like change within the subcutaneous soft tissues of the right groin (localized mild cellulitis?, mild lymphadenitis?). Electronically Signed   By: Franki Cabot M.D.   On: 12/01/2015 19:02   Dg Chest 1 View  12/01/2015  CLINICAL DATA:  Hypoxia.  Sepsis. EXAM: CHEST 1 VIEW COMPARISON:  11/30/2015 FINDINGS: Moderate cardiomegaly stable. Both lungs are clear. Previously seen right infrahilar opacity is no longer visualized on today's exam. No evidence of pleural effusion or pneumothorax. IMPRESSION: Cardiomegaly.  No active lung disease seen on today's exam. Electronically Signed   By: Earle Gell M.D.   On: 12/01/2015 12:05   Nm Pulmonary Perf And Vent  12/01/2015  CLINICAL DATA:  Hypoxia, short of breath.  Lower extremity swelling. EXAM: NUCLEAR MEDICINE VENTILATION - PERFUSION LUNG SCAN TECHNIQUE: Ventilation images were obtained in multiple projections using inhaled aerosol Tc-64m DTPA. Perfusion images were obtained in multiple projections after  intravenous injection of Tc-68m MAA. RADIOPHARMACEUTICALS:  32.8 mCi Technetium-7m DTPA aerosol inhalation and 4.2 mCi Technetium-34m MAA IV COMPARISON:  Chest radiograph 537 T FINDINGS: Ventilation: No focal ventilation defect. Perfusion: No wedge shaped peripheral perfusion defects to suggest acute pulmonary embolism. Cardiomegaly noted. IMPRESSION: No evidence of pulmonary embolism. Electronically Signed   By: Suzy Bouchard M.D.   On: 12/01/2015 17:05     ASSESSMENT AND PLAN:   65 year old female with history of essential hypertension and tobacco abuse who presents with weakness over the past several days and found to have atrial fibrillation and RVR and sepsis from urinary tract infection.  1. Sepsis: Patient presented with leukocytosis, fever, tachycardia. Sepsis is secondary UTI/cellulitis/Right lower lobe pneumonia (seen on CT abdomen) Urine culture 80,000 gram-negative rods. ID and susceptibilities to follow Blood cultures are negative ContinueIV cefazolin per ID recommendation Lower extreme he Doppler negative for DVT -Patient currently still on IV heparin drip. Not sure if she has underlying ischemic leg. She is a chronic smoker. Will have vasculer surgery surgery involvement.(consult was not called yday-missed by unit clerk)  2. Acute Non-ST elevation MI with peak troponin 6.47: - Continue heparin drip, metoprolol atorvastatin, and aspirin. Echocardiogram shows mild diastolic dysfunction with EF of 50% As per cardiology due to sepsis and acute kidney injury no indication for cardiac catheterization at this time  3. Atrial fibrillation with RVR: Patient has now converted to sinus tachycardia. Continue heparin drip, PO amiodarone and metoprolol. TSH is low, but T3 and T4 normal Suggest repeating TFTs in 4 weeks.  4. Acute kidney injury due to ATN from atrial fibrillation, sepsis and non-ST elevation MI. Creatinine is improving with IV fluids. Patient has adequate urine  output. 1.67---1.96---1.67 Fluids at low rate  5. Acute hypoxic respiratory failure: Chest x-ray is not convincing for pneumonia or CHF. VQ scan negative for PE. -sats 98% on 2liter oxygen Wean to RA  6. Glaucoma: Continue eyedrops.  7. Anemia of chronic disease. Patient was evaluated by hematology. Recommends outpatient follow-up. No indication for transfusion at present.  Management plans discussed with the patient and she is in agreement.  CODE STATUS: FULL  TOTAL TIME TAKING CARE OF THIS PATIENT: 30 minutes.   D/C DEPENDING ON CLINICAL CONDITION.   Chinita Schimpf M.D on 12/03/2015 at 7:48 AM  Between 7am to 6pm - Pager - [phone] After 6pm go to [email or web address] - password [email or web address] [facility]  Office  [phone]  CC: Primary care physician; Madelyn Brunner, MD  Note: This dictation was prepared with Dragon dictation along with smaller phrase technology. Any transcriptional errors that result from this process are unintentional.

## 2015-12-04 LAB — CBC
HCT: 29.9 % — ABNORMAL LOW (ref 35.0–47.0)
Hemoglobin: 10 g/dL — ABNORMAL LOW (ref 12.0–16.0)
MCH: 30.3 pg (ref 26.0–34.0)
MCHC: 33.3 g/dL (ref 32.0–36.0)
MCV: 91.1 fL (ref 80.0–100.0)
PLATELETS: 160 10*3/uL (ref 150–440)
RBC: 3.28 MIL/uL — AB (ref 3.80–5.20)
RDW: 16 % — ABNORMAL HIGH (ref 11.5–14.5)
WBC: 10.2 10*3/uL (ref 3.6–11.0)

## 2015-12-04 LAB — BASIC METABOLIC PANEL
ANION GAP: 7 (ref 5–15)
BUN: 33 mg/dL — ABNORMAL HIGH (ref 6–20)
CO2: 25 mmol/L (ref 22–32)
Calcium: 8.1 mg/dL — ABNORMAL LOW (ref 8.9–10.3)
Chloride: 107 mmol/L (ref 101–111)
Creatinine, Ser: 1.21 mg/dL — ABNORMAL HIGH (ref 0.44–1.00)
GFR calc Af Amer: 53 mL/min — ABNORMAL LOW (ref >60)
GFR, EST NON AFRICAN AMERICAN: 46 mL/min — AB (ref >60)
Glucose, Bld: 130 mg/dL — ABNORMAL HIGH (ref 65–99)
POTASSIUM: 4.1 mmol/L (ref 3.5–5.1)
SODIUM: 139 mmol/L (ref 135–145)

## 2015-12-04 LAB — VITAMIN B12: Vitamin B-12: 441 pg/mL (ref 180–914)

## 2015-12-04 LAB — MAGNESIUM: MAGNESIUM: 2.2 mg/dL (ref 1.7–2.4)

## 2015-12-04 LAB — CULTURE, BLOOD (ROUTINE X 2)
CULTURE: NO GROWTH
CULTURE: NO GROWTH

## 2015-12-04 LAB — PHOSPHORUS: Phosphorus: 2.3 mg/dL — ABNORMAL LOW (ref 2.5–4.6)

## 2015-12-04 MED ORDER — APIXABAN 5 MG PO TABS
5.0000 mg | ORAL_TABLET | Freq: Two times a day (BID) | ORAL | Status: DC
  Administered 2015-12-04 – 2015-12-06 (×5): 5 mg via ORAL
  Filled 2015-12-04 (×5): qty 1

## 2015-12-04 MED ORDER — K PHOS MONO-SOD PHOS DI & MONO 155-852-130 MG PO TABS
500.0000 mg | ORAL_TABLET | Freq: Once | ORAL | Status: AC
  Administered 2015-12-04: 500 mg via ORAL
  Filled 2015-12-04: qty 2

## 2015-12-04 MED ORDER — SODIUM CHLORIDE 0.9% FLUSH
3.0000 mL | Freq: Two times a day (BID) | INTRAVENOUS | Status: DC
  Administered 2015-12-04 – 2015-12-06 (×4): 3 mL via INTRAVENOUS

## 2015-12-04 MED ORDER — IPRATROPIUM-ALBUTEROL 0.5-2.5 (3) MG/3ML IN SOLN
3.0000 mL | Freq: Three times a day (TID) | RESPIRATORY_TRACT | Status: DC
  Administered 2015-12-04 – 2015-12-06 (×6): 3 mL via RESPIRATORY_TRACT
  Filled 2015-12-04 (×6): qty 3

## 2015-12-04 MED ORDER — IPRATROPIUM-ALBUTEROL 0.5-2.5 (3) MG/3ML IN SOLN: 3.0000 mL | RESPIRATORY_TRACT | Status: DC | PRN

## 2015-12-04 NOTE — Progress Notes (Signed)
Pharmacy Antibiotic Note  Patricia Ewing is a 65 y.o. female admitted on 11/29/2015 with UTI and cellulitis.  Pharmacy has been consulted for cefazolin dosing.  Plan: Patient originally ordered Cefazolin 2 gm IV q8h by ID.  Dose was renally adjusted based on CrCl on 5/4.  Est CrCl today of 40 mL/min.  Will transition patient back to Cefazolin 2 gm IV q8h.    5/6- Continue current regimen of Cefazolin 2 gram IV q8h.  Height: 5\' 6"  (167.6 cm) Weight: 219 lb 4.8 oz (99.474 kg) IBW/kg (Calculated) : 59.3  Temp (24hrs), Avg:98.3 F (36.8 C), Min:97.4 F (36.3 C), Max:99.8 F (37.7 C)   Recent Labs Lab 11/29/15 0944 11/29/15 1252 11/29/15 1935 11/30/15 0538 12/01/15 0505 12/02/15 0505 12/03/15 0505 12/04/15 1224  WBC  --   --   --  11.8* 14.9* 16.2* 10.0 10.2  CREATININE  --   --   --  1.86* 1.67* 1.96* 1.67* 1.21*  LATICACIDVEN 2.6* 2.2* 1.5  --   --   --   --   --     Estimated Creatinine Clearance: 55.2 mL/min (by C-G formula based on Cr of 1.21).    Allergies  Allergen Reactions  . Contrast Media [Iodinated Diagnostic Agents] Other (See Comments)    Flushed, cough, anxiety, mild less responsiveness.    Antimicrobials this admission: Anti-infectives    Start     Dose/Rate Route Frequency Ordered Stop   12/03/15 0830  ceFAZolin (ANCEF) IVPB 2g/100 mL premix     2 g 200 mL/hr over 30 Minutes Intravenous Every 8 hours 12/03/15 0802     12/02/15 1700  ceFAZolin (ANCEF) IVPB 1 g/50 mL premix  Status:  Discontinued     1 g 100 mL/hr over 30 Minutes Intravenous Every 12 hours 12/02/15 1355 12/03/15 0802   12/01/15 2200  ceFAZolin (ANCEF) IVPB 2g/100 mL premix  Status:  Discontinued     2 g 200 mL/hr over 30 Minutes Intravenous Every 8 hours 12/01/15 1955 12/02/15 1355   11/29/15 2000  piperacillin-tazobactam (ZOSYN) IVPB 3.375 g  Status:  Discontinued     3.375 g 12.5 mL/hr over 240 Minutes Intravenous Every 8 hours 11/29/15 1250 12/01/15 1955   11/29/15 1700  vancomycin  (VANCOCIN) IVPB 1000 mg/200 mL premix  Status:  Discontinued     1,000 mg 200 mL/hr over 60 Minutes Intravenous Every 24 hours 11/29/15 1251 11/30/15 1119   11/29/15 0945  piperacillin-tazobactam (ZOSYN) IVPB 3.375 g     3.375 g 100 mL/hr over 30 Minutes Intravenous  Once 11/29/15 0943 11/29/15 1120   11/29/15 0945  vancomycin (VANCOCIN) IVPB 1000 mg/200 mL premix     1,000 mg 200 mL/hr over 60 Minutes Intravenous  Once 11/29/15 0943 11/29/15 1146      Dose adjustments this admission: Transition to Cefazolin 2 gm IV q8h based on improved renal function  Microbiology results: Results for orders placed or performed during the hospital encounter of 11/29/15  Blood Culture (routine x 2)     Status: None   Collection Time: 11/29/15  9:42 AM  Result Value Ref Range Status   Specimen Description BLOOD LEFT AC  Final   Special Requests   Final    BOTTLES DRAWN AEROBIC AND ANAEROBIC AER 4ML ANA 2ML   Culture NO GROWTH 5 DAYS  Final   Report Status 12/04/2015 FINAL  Final  Blood Culture (routine x 2)     Status: None   Collection Time: 11/29/15  9:44 AM  Result Value Ref Range Status   Specimen Description BLOOD RIGHT AC  Final   Special Requests   Final    BOTTLES DRAWN AEROBIC AND ANAEROBIC ANA 4ML AER 2ML   Culture NO GROWTH 5 DAYS  Final   Report Status 12/04/2015 FINAL  Final  Urine culture     Status: Abnormal   Collection Time: 11/29/15 10:00 AM  Result Value Ref Range Status   Specimen Description URINE, RANDOM  Final   Special Requests NONE  Final   Culture 80,000 COLONIES/mL ESCHERICHIA COLI (A)  Final   Report Status 12/01/2015 FINAL  Final   Organism ID, Bacteria ESCHERICHIA COLI (A)  Final      Susceptibility   Escherichia coli - MIC*    AMPICILLIN <=2 SENSITIVE Sensitive     CEFAZOLIN <=4 SENSITIVE Sensitive     CEFTRIAXONE <=1 SENSITIVE Sensitive     CIPROFLOXACIN <=0.25 SENSITIVE Sensitive     GENTAMICIN <=1 SENSITIVE Sensitive     IMIPENEM <=0.25 SENSITIVE  Sensitive     NITROFURANTOIN <=16 SENSITIVE Sensitive     TRIMETH/SULFA <=20 SENSITIVE Sensitive     AMPICILLIN/SULBACTAM <=2 SENSITIVE Sensitive     PIP/TAZO <=4 SENSITIVE Sensitive     Extended ESBL NEGATIVE Sensitive     * 80,000 COLONIES/mL ESCHERICHIA COLI     Thank you for allowing pharmacy to be a part of this patient's care.  Heide Brossart A 12/04/2015 2:08 PM

## 2015-12-04 NOTE — Progress Notes (Signed)
ANTICOAGULATION CONSULT NOTE - Initial Consult  Pharmacy Consult for apixaban Indication: atrial fibrillation  Allergies  Allergen Reactions  . Contrast Media [Iodinated Diagnostic Agents] Other (See Comments)    Flushed, cough, anxiety, mild less responsiveness.    Patient Measurements: Height: 5\' 6"  (167.6 cm) Weight: 219 lb 4.8 oz (99.474 kg) IBW/kg (Calculated) : 59.3  Vital Signs: Temp: 97.8 F (36.6 C) (05/06 0433) Temp Source: Oral (05/06 0433) BP: 160/84 mmHg (05/06 0433) Pulse Rate: 60 (05/06 0433)  Labs:  Recent Labs  12/02/15 0505 12/03/15 0505  HGB 10.3* 9.8*  HCT 31.4* 29.1*  PLT 140* 131*  HEPARINUNFRC 0.50 0.54  CREATININE 1.96* 1.67*    Estimated Creatinine Clearance: 40 mL/min (by C-G formula based on Cr of 1.67).   Medical History: Past Medical History  Diagnosis Date  . Hypertension   . Diabetes mellitus     diagnosed at age 71, on a "pill" (?metformin), but now diet-controlled  . Glaucoma   . Hyperlipidemia     managed with lifestyle modification  . Tobacco abuse     No PFT's or diagnosis of COPD   Assessment: Pharmacy consulted to dose apixaban in this 65 year old female for atrial fibrillation. Patient was previously on a heparin drip.  Age: 65 Wt: 99.5 kg SCr: 1.67 (CrCl 40 mL/min)  Goal of Therapy:  Monitor platelets by anticoagulation protocol: Yes   Plan:  Discontinue heparin drip at 1100 this morning Order apixaban 5mg  PO BID to begin at 1100 this morning  Pharmacy will continue to monitor CBC. Thank you for the consult.  Lenis Noon, PharmD Clinical Pharmacist 12/04/2015,10:54 AM

## 2015-12-04 NOTE — Clinical Social Work Placement (Signed)
   CLINICAL SOCIAL WORK PLACEMENT  NOTE  Date:  12/04/2015  Patient Details  Name: SWAYZEE HOWE MRN: [id number] Date of Birth: [birth year]  Clinical Social Work is seeking post-discharge placement for this patient at the [place] level of care (*CSW will initial, date and re-position this form in  chart as items are completed):  Yes   Patient/family provided with [place] Work Department's list of facilities offering this level of care within the geographic area requested by the patient (or if unable, by the patient's family).  Yes   Patient/family informed of their freedom to choose among providers that offer the needed level of care, that participate in Medicare, Medicaid or managed care program needed by the patient, have an available bed and are willing to accept the patient.  Yes   Patient/family informed of [facility]'s ownership interest in [facility] and [facility], as well as of the fact that they are under no obligation to receive care at these facilities.  PASRR submitted to EDS on 12/04/15     PASRR number received on 12/04/15     Existing PASRR number confirmed on       FL2 transmitted to all facilities in geographic area requested by pt/family on 12/04/15     FL2 transmitted to all facilities within larger geographic area on       Patient informed that his/her managed care company has contracts with or will negotiate with certain facilities, including the following:            Patient/family informed of bed offers received.  Patient chooses bed at       Physician recommends and patient chooses bed at      Patient to be transferred to   on  .  Patient to be transferred to facility by       Patient family notified on   of transfer.  Name of family member notified:        PHYSICIAN       Additional Comment:    _______________________________________________ Loralyn Freshwater, LCSW 12/04/2015, 4:08 PM

## 2015-12-04 NOTE — Progress Notes (Signed)
SUBJECTIVE: No chest pain but still has a lot of leg pain   Filed Vitals:   12/04/15 0428 12/04/15 0433 12/04/15 0914 12/04/15 1119  BP:  160/84  159/71  Pulse:  60  56  Temp:  97.8 F (36.6 C)  97.4 F (36.3 C)  TempSrc:  Oral  Oral  Resp:  18  17  Height:      Weight:      SpO2: 98% 100% 97% 100%    Intake/Output Summary (Last 24 hours) at 12/04/15 1209 Last data filed at 12/04/15 1138  Gross per 24 hour  Intake 1532.89 ml  Output    650 ml  Net 882.89 ml    LABS: Basic Metabolic Panel:  Recent Labs  12/02/15 0505 12/03/15 0505  NA 136 138  K 3.9 3.9  CL 105 105  CO2 23 25  GLUCOSE 208* 129*  BUN 48* 47*  CREATININE 1.96* 1.67*  CALCIUM 8.6* 8.5*   Liver Function Tests: No results for input(s): AST, ALT, ALKPHOS, BILITOT, PROT, ALBUMIN in the last 72 hours. No results for input(s): LIPASE, AMYLASE in the last 72 hours. CBC:  Recent Labs  12/02/15 0505 12/03/15 0505  WBC 16.2* 10.0  HGB 10.3* 9.8*  HCT 31.4* 29.1*  MCV 90.0 90.6  PLT 140* 131*   Cardiac Enzymes: No results for input(s): CKTOTAL, CKMB, CKMBINDEX, TROPONINI in the last 72 hours. BNP: Invalid input(s): POCBNP D-Dimer: No results for input(s): DDIMER in the last 72 hours. Hemoglobin A1C: No results for input(s): HGBA1C in the last 72 hours. Fasting Lipid Panel: No results for input(s): CHOL, HDL, LDLCALC, TRIG, CHOLHDL, LDLDIRECT in the last 72 hours. Thyroid Function Tests: No results for input(s): TSH, T4TOTAL, T3FREE, THYROIDAB in the last 72 hours.  Invalid input(s): FREET3 Anemia Panel:  Recent Labs  12/03/15 0505  FOLATE 7.3  FERRITIN 1458*  TIBC 186*  IRON 41  RETICCTPCT 0.6     PHYSICAL EXAM General: Well developed, well nourished, in no acute distress HEENT:  Normocephalic and atramatic Neck:  No JVD.  Lungs: Clear bilaterally to auscultation and percussion. Heart: HRRR . Normal S1 and S2 without gallops or murmurs.  Abdomen: Bowel sounds are positive,  abdomen soft and non-tender  Msk:  Back normal, normal gait. Normal strength and tone for age. Extremities: No clubbing, cyanosis or edema.   Neuro: Alert and oriented X 3. Psych:  Good affect, responds appropriately  TELEMETRY:Sinus rhythm  ASSESSMENT AND PLAN: Atrial fibrillation/elevated troponin peaked level 6, and cellulitis of the lower extremity, advised Eliquis and DC heparin. Active Problems:   Sepsis ([place])    Dionisio David, MD, [facility] 12/04/2015 12:09 PM

## 2015-12-04 NOTE — Progress Notes (Addendum)
ELECTROLYTE CONSULT NOTE - Follow Up  Pharmacy Consult for electrolyte monitoring and replacement Indication: hypokalemia  Allergies  Allergen Reactions  . Contrast Media [Iodinated Diagnostic Agents] Other (See Comments)    Flushed, cough, anxiety, mild less responsiveness.   Patient Measurements: Height: 5\' 6"  (167.6 cm) Weight: 219 lb 4.8 oz (99.474 kg) IBW/kg (Calculated) : 59.3  Vital Signs: Temp: 97.4 F (36.3 C) (05/06 1119) Temp Source: Oral (05/06 1119) BP: 159/71 mmHg (05/06 1119) Pulse Rate: 56 (05/06 1119) Intake/Output from previous day: 05/05 0701 - 05/06 0700 In: 1203.5 [P.O.:240; I.V.:763.5; IV Piggyback:200] Out: 300 [Urine:300] Intake/Output from this shift: Total I/O In: 669.4 [P.O.:480; I.V.:189.4] Out: 350 [Urine:350]  Estimated Creatinine Clearance: 55.2 mL/min (by C-G formula based on Cr of 1.21).  Medical History: Past Medical History  Diagnosis Date  . Hypertension   . Diabetes mellitus     diagnosed at age 16, on a "pill" (?metformin), but now diet-controlled  . Glaucoma   . Hyperlipidemia     managed with lifestyle modification  . Tobacco abuse     No PFT's or diagnosis of COPD   Assessment: Pharmacy consulted to monitor and replace electrolytes if needed in this 65 year old female admitted with sepsis and new onset atrial fibrillation.  Orders for KCl 40 meq po daily.  5/5: K: 3.9  5/4: K: 3.9 5/3: Mag: 2.0, Phos: 2.9  Goal of Therapy:  Electrolytes within normal limits  Plan:  Continue current orders for KCl 40 meq po daily.  No supplementation warranted at this time. If potassium remains within normal limits may consider monitoring every other day.  Will follow up with BMP/mag/phos in AM.  5/6- K=4.1, Mag= 2.2, Phos= 2.3.  Will give KPHos Neutral 2 tablets po x 1. Will order K/Phos labs for am.   Noralee Space, PharmD Clinical Pharmacist 12/04/2015,2:11 PM

## 2015-12-04 NOTE — Progress Notes (Signed)
[facility]  ROUNDING NOTE   Subjective:  No new renal function testing available today. Creatinine has been trending down however. Bilateral lower extremities wrapped now. Good urine output noted.  Objective:  Vital signs in last 24 hours:  Temp:  [97.8 F (36.6 C)-99.8 F (37.7 C)] 97.8 F (36.6 C) (05/06 0433) Pulse Rate:  [56-65] 60 (05/06 0433) Resp:  [16-19] 18 (05/06 0433) BP: (116-160)/(59-99) 160/84 mmHg (05/06 0433) SpO2:  [98 %-100 %] 100 % (05/06 0433)  Weight change:  Filed Weights   11/29/15 0935 11/29/15 1725  Weight: 97.523 kg (215 lb) 99.474 kg (219 lb 4.8 oz)    Intake/Output: I/O last 3 completed shifts: In: 1929.4 [P.O.:240; I.V.:1489.4; IV Piggyback:200] Out: 1400 [Urine:1400]   Intake/Output this shift:     Physical Exam: General: NAD, resting in bed  Head: Normocephalic, atraumatic. Moist oral mucosal membranes  Eyes: Anicteric  Neck: Supple, trachea midline  Lungs:  Clear to auscultation normal effort  Heart: Regular rate and rhythm no rubs  Abdomen:  Soft, nontender, BS present  Extremities: Bilateral LE's wrapped at this time.  Neurologic: Nonfocal, moving all four extremities  Skin: Excoriations b/l LE's, cellulitis of RLE       Basic Metabolic Panel:  Recent Labs Lab 11/29/15 0937 11/29/15 1946 11/30/15 0538 12/01/15 0505 12/01/15 1918 12/02/15 0505 12/03/15 0505  NA 144  --  141 141  --  136 138  K 3.2*  --  3.4* 3.2* 3.4* 3.9 3.9  CL 104  --  108 107  --  105 105  CO2 23  --  24 26  --  23 25  GLUCOSE 124*  --  135* 189*  --  208* 129*  BUN 40*  --  40* 39*  --  48* 47*  CREATININE 2.01*  --  1.86* 1.67*  --  1.96* 1.67*  CALCIUM 9.2  --  8.4* 8.7*  --  8.6* 8.5*  MG  --  2.0 2.0 2.0  --   --   --   PHOS  --   --  3.4 2.9  --   --   --     Liver Function Tests:  Recent Labs Lab 11/29/15 0937  AST 80*  ALT 24  ALKPHOS 64  BILITOT 1.1  PROT 8.1  ALBUMIN 3.8    Recent Labs Lab 11/29/15 0937   LIPASE 12   No results for input(s): AMMONIA in the last 168 hours.  CBC:  Recent Labs Lab 11/29/15 0937 11/30/15 0538 12/01/15 0505 12/02/15 0505 12/03/15 0505  WBC 16.8* 11.8* 14.9* 16.2* 10.0  NEUTROABS 16.3* 11.0*  --   --   --   HGB 13.5 11.3* 10.4* 10.3* 9.8*  HCT 41.0 34.5* 31.7* 31.4* 29.1*  MCV 90.0 92.1 89.6 90.0 90.6  PLT 140* 117* 120* 140* 131*    Cardiac Enzymes:  Recent Labs Lab 11/29/15 0937 11/29/15 1935 11/29/15 2313 11/30/15 0538  TROPONINI 5.24* 6.47* 6.01* 4.98*    BNP: Invalid input(s): POCBNP  CBG:  Recent Labs Lab 12/02/15 2046  GLUCAP 157*    Microbiology: Results for orders placed or performed during the hospital encounter of 11/29/15  Blood Culture (routine x 2)     Status: None (Preliminary result)   Collection Time: 11/29/15  9:42 AM  Result Value Ref Range Status   Specimen Description BLOOD LEFT AC  Final   Special Requests   Final    BOTTLES DRAWN AEROBIC AND ANAEROBIC AER 4ML ANA  2ML   Culture NO GROWTH 4 DAYS  Final   Report Status PENDING  Incomplete  Blood Culture (routine x 2)     Status: None (Preliminary result)   Collection Time: 11/29/15  9:44 AM  Result Value Ref Range Status   Specimen Description BLOOD RIGHT AC  Final   Special Requests   Final    BOTTLES DRAWN AEROBIC AND ANAEROBIC ANA 4ML AER 2ML   Culture NO GROWTH 4 DAYS  Final   Report Status PENDING  Incomplete  Urine culture     Status: Abnormal   Collection Time: 11/29/15 10:00 AM  Result Value Ref Range Status   Specimen Description URINE, RANDOM  Final   Special Requests NONE  Final   Culture 80,000 COLONIES/mL ESCHERICHIA COLI (A)  Final   Report Status 12/01/2015 FINAL  Final   Organism ID, Bacteria ESCHERICHIA COLI (A)  Final      Susceptibility   Escherichia coli - MIC*    AMPICILLIN <=2 SENSITIVE Sensitive     CEFAZOLIN <=4 SENSITIVE Sensitive     CEFTRIAXONE <=1 SENSITIVE Sensitive     CIPROFLOXACIN <=0.25 SENSITIVE Sensitive      GENTAMICIN <=1 SENSITIVE Sensitive     IMIPENEM <=0.25 SENSITIVE Sensitive     NITROFURANTOIN <=16 SENSITIVE Sensitive     TRIMETH/SULFA <=20 SENSITIVE Sensitive     AMPICILLIN/SULBACTAM <=2 SENSITIVE Sensitive     PIP/TAZO <=4 SENSITIVE Sensitive     Extended ESBL NEGATIVE Sensitive     * 80,000 COLONIES/mL ESCHERICHIA COLI    Coagulation Studies: No results for input(s): LABPROT, INR in the last 72 hours.  Urinalysis: No results for input(s): COLORURINE, LABSPEC, PHURINE, GLUCOSEU, HGBUR, BILIRUBINUR, KETONESUR, PROTEINUR, UROBILINOGEN, NITRITE, LEUKOCYTESUR in the last 72 hours.  Invalid input(s): APPERANCEUR    Imaging: No results found.   Medications:   . sodium chloride 50 mL/hr at 12/02/15 1159  . heparin 1,100 Units/hr (12/02/15 0547)   . amiodarone  200 mg Oral Daily  . aspirin  81 mg Oral Daily  . atorvastatin  40 mg Oral q1800  . brimonidine  1 drop Both Eyes BID  .  ceFAZolin (ANCEF) IV  2 g Intravenous Q8H  . cloNIDine  0.2 mg Oral Daily  . docusate sodium  100 mg Oral Daily  . ipratropium-albuterol  3 mL Nebulization Q4H  . latanoprost  1 drop Both Eyes QHS  . metoprolol tartrate  50 mg Oral BID  . polyethylene glycol  17 g Oral Daily  . potassium chloride  20 mEq Oral Once  . potassium chloride  40 mEq Oral Daily   acetaminophen **OR** acetaminophen, albuterol, nitroGLYCERIN, ondansetron **OR** ondansetron (ZOFRAN) IV, oxyCODONE  Assessment/ Plan:  65 y.o. female with a PMHX of with a PMHx of hypertension, diabetes mellitus type 2, glaucoma, hyperlipidemia, history of tobacco abuse, who was admitted to [facility] on 11/29/2015 for evaluation of Cellulitis of the right lower extremity as well as malaise. We were asked to see her for evaluation management of acute renal failure in this setting. The patient's baseline creatinine appears to be 1.2.   1. Acute renal failure/chronic kidney disease stage III baseline creatinine 1.2/diabetes mellitus type 2 with  chronic kidney disease/proteinuria. Acute renal failure secondary to concurrent infection as well as use of Aleve and BC powder.  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  -Renal function has been improving. Creatinine yesterday was 1.6. Creatinine for today is still pending. Continue to monitor renal function closely. She will  need outpatient follow-up for her chronic kidney disease.  2. Right lower extremity cellulitis. Bilateral lower extremities wrapped now. Continue cefazolin. Patient had significant right lower extremity cellulitis.   3. Hypertension. Blood pressure currently 160/84. Continue clonidine and metoprolol. We will consider adding an ACE inhibitor versus ARB as an outpatient.  4.  Hypokalemia.  Awaiting repeat potassium today.  5. Abnormal UPEP/? Anemia of CKD.  Appreciate Dr. Kem Parkinson input.  Further workup has been ordered.   LOS: 5 Delma Drone 5/6/20178:57 AM

## 2015-12-04 NOTE — NC FL2 (Signed)
[place] LEVEL OF CARE SCREENING TOOL     IDENTIFICATION  Patient Name: Patricia Ewing Birthdate: 01-11-1951 Sex: female Admission Date (Current Location): 11/29/2015  [place] and [place] Number:  Engineering geologist and Address:  [facility], [address], [place], [place] [address]      Provider Number: [id number]  Attending Physician Name and Address:  Demetrios Loll, MD  Relative Name and Phone Number:       Current Level of Care: Hospital Recommended Level of Care: [place] Prior Approval Number:    Date Approved/Denied:   PASRR Number:  ([id number])  Discharge Plan: SNF    Current Diagnoses: Patient Active Problem List   Diagnosis Date Noted  . Sepsis ([place]) 11/29/2015  . Acute chest pain 11/30/2011  . HTN (hypertension) 11/30/2011  . Bilateral leg edema 11/30/2011    Orientation RESPIRATION BLADDER Height & Weight     Self, Time, Situation, Place  Normal Continent Weight: 219 lb 4.8 oz (99.474 kg) Height:  5\' 6"  (167.6 cm)  BEHAVIORAL SYMPTOMS/MOOD NEUROLOGICAL BOWEL NUTRITION STATUS   (none )  (none ) Continent Diet (Diet: Soft )  AMBULATORY STATUS COMMUNICATION OF NEEDS Skin   Extensive Assist Verbally Other (Comment) (Wound Right Leg, dry, red. )                       Personal Care Assistance Level of Assistance  Bathing, Feeding, Dressing Bathing Assistance: Limited assistance Feeding assistance: Independent Dressing Assistance: Limited assistance     Functional Limitations Info  Sight, Hearing, Speech Sight Info: Adequate Hearing Info: Adequate Speech Info: Adequate    SPECIAL CARE FACTORS FREQUENCY  PT (By licensed PT), OT (By licensed OT)     PT Frequency:  (5) OT Frequency:  (5)            Contractures      Additional Factors Info  Code Status, Allergies Code Status Info:  (DNR ) Allergies Info:  (Contrast Media )           Current Medications (12/04/2015):   This is the current hospital active medication list Current Facility-Administered Medications  Medication Dose Route Frequency Provider Last Rate Last Dose  . acetaminophen (TYLENOL) tablet 650 mg  650 mg Oral Q6H PRN Fritzi Mandes, MD   650 mg at 11/30/15 2002   Or  . acetaminophen (TYLENOL) suppository 650 mg  650 mg Rectal Q6H PRN Fritzi Mandes, MD      . albuterol (PROVENTIL) (2.5 MG/3ML) 0.083% nebulizer solution 2.5 mg  2.5 mg Nebulization Q6H PRN Lenis Noon, RPH      . amiodarone (PACERONE) tablet 200 mg  200 mg Oral Daily Dionisio David, MD   200 mg at 12/04/15 1020  . apixaban (ELIQUIS) tablet 5 mg  5 mg Oral BID Lenis Noon, RPH   5 mg at 12/04/15 1138  . aspirin chewable tablet 81 mg  81 mg Oral Daily Lenis Noon, RPH   81 mg at 12/04/15 1019  . atorvastatin (LIPITOR) tablet 40 mg  40 mg Oral q1800 Fritzi Mandes, MD   40 mg at 12/03/15 1725  . brimonidine (ALPHAGAN) 0.2 % ophthalmic solution 1 drop  1 drop Both Eyes BID Fritzi Mandes, MD   1 drop at 12/04/15 1018  . ceFAZolin (ANCEF) IVPB 2g/100 mL premix  2 g Intravenous Q8H Crystal G Scarpena, RPH   2 g at 12/04/15 1437  . cloNIDine (CATAPRES)  tablet 0.2 mg  0.2 mg Oral Daily Fritzi Mandes, MD   0.2 mg at 12/04/15 1019  . docusate sodium (COLACE) capsule 100 mg  100 mg Oral Daily Fritzi Mandes, MD   100 mg at 12/04/15 1018  . ipratropium-albuterol (DUONEB) 0.5-2.5 (3) MG/3ML nebulizer solution 3 mL  3 mL Nebulization Q4H Sital Mody, MD   3 mL at 12/04/15 1107  . latanoprost (XALATAN) 0.005 % ophthalmic solution 1 drop  1 drop Both Eyes QHS Fritzi Mandes, MD   1 drop at 12/03/15 2151  . metoprolol (LOPRESSOR) tablet 50 mg  50 mg Oral BID Bettey Costa, MD   50 mg at 12/04/15 1019  . nitroGLYCERIN (NITROSTAT) SL tablet 0.4 mg  0.4 mg Sublingual Q5 min PRN Fritzi Mandes, MD      . ondansetron (ZOFRAN) tablet 4 mg  4 mg Oral Q6H PRN Fritzi Mandes, MD       Or  . ondansetron (ZOFRAN) injection 4 mg  4 mg Intravenous Q6H PRN Fritzi Mandes, MD      . oxyCODONE  (Oxy IR/ROXICODONE) immediate release tablet 5 mg  5 mg Oral Q4H PRN Fritzi Mandes, MD   5 mg at 12/04/15 1024  . phosphorus (K PHOS NEUTRAL) tablet 500 mg  500 mg Oral Once Fritzi Mandes, MD      . polyethylene glycol (MIRALAX / GLYCOLAX) packet 17 g  17 g Oral Daily Fritzi Mandes, MD   17 g at 12/04/15 1015  . potassium chloride SA (K-DUR,KLOR-CON) CR tablet 40 mEq  40 mEq Oral Daily Bettey Costa, MD   40 mEq at 12/04/15 1018     Discharge Medications: Please see discharge summary for a list of discharge medications.  Relevant Imaging Results:  Relevant Lab Results:   Additional Information  (SSN: [id number])  Loralyn Freshwater, LCSW

## 2015-12-04 NOTE — Progress Notes (Signed)
[facility] [place] NAME: Patricia Ewing    MR#:  [id number]  DATE OF BIRTH:  July 13, 1951  SUBJECTIVE:   No SOB, off 2 L of oxygen, still right lower extremity pain.  REVIEW OF SYSTEMS:    Review of Systems  Constitutional: Negative for fever, chills and malaise/fatigue.  HENT: Negative for ear discharge, ear pain, hearing loss, nosebleeds and sore throat.   Eyes: Negative for blurred vision and pain.  Respiratory: Negative for cough, hemoptysis, shortness of breath and wheezing.   Cardiovascular: Negative for chest pain, palpitations and leg swelling.  Gastrointestinal: Negative for nausea, vomiting, abdominal pain, diarrhea and blood in stool.  Genitourinary: Negative for dysuria.  Musculoskeletal: right lower extremity pain.  Skin:       RLE redness  Neurological: Negative for dizziness, tremors, speech change, focal weakness, seizures and headaches.  Endo/Heme/Allergies: Does not bruise/bleed easily.  Psychiatric/Behavioral: Negative for depression, suicidal ideas and hallucinations.   Tolerating Diet:yes  DRUG ALLERGIES:   Allergies  Allergen Reactions  . Contrast Media [Iodinated Diagnostic Agents] Other (See Comments)    Flushed, cough, anxiety, mild less responsiveness.    VITALS:  Blood pressure 159/71, pulse 56, temperature 97.4 F (36.3 C), temperature source Oral, resp. rate 17, height 5\' 6"  (1.676 m), weight 99.474 kg (219 lb 4.8 oz), SpO2 98 %.  PHYSICAL EXAMINATION:   Physical Exam  Constitutional: She is oriented to person, place, and time and well-developed, well-nourished, and in no distress. No distress.  HENT:  Head: Normocephalic.  Eyes: No scleral icterus.  Neck: Normal range of motion. Neck supple. No JVD present. No tracheal deviation present.  Cardiovascular: Normal rate, regular rhythm and normal heart sounds.  Exam reveals no gallop and no friction rub.   No murmur heard. Pulmonary/Chest: Effort  normal. No respiratory distress. No wheezing or rales. She exhibits no tenderness.  Abdominal: Soft. Bowel sounds are normal. She exhibits no distension and no mass. There is no tenderness. There is no rebound and no guarding.  Musculoskeletal: Normal range of motion. Bilateral leg in unna wraps. Neurological: She is alert and oriented to person, place, and time.  Skin: Skin is warm.  extensive RLE cellulitis  Psychiatric: Affect and judgment normal.      LABORATORY PANEL:   CBC  Recent Labs Lab 12/04/15 1224  WBC 10.2  HGB 10.0*  HCT 29.9*  PLT 160   ------------------------------------------------------------------------------------------------------------------  Chemistries   Recent Labs Lab 11/29/15 0937  12/04/15 1224  NA 144  < > 139  K 3.2*  < > 4.1  CL 104  < > 107  CO2 23  < > 25  GLUCOSE 124*  < > 130*  BUN 40*  < > 33*  CREATININE 2.01*  < > 1.21*  CALCIUM 9.2  < > 8.1*  MG  --   < > 2.2  AST 80*  --   --   ALT 24  --   --   ALKPHOS 64  --   --   BILITOT 1.1  --   --   < > = values in this interval not displayed. ------------------------------------------------------------------------------------------------------------------  Cardiac Enzymes  Recent Labs Lab 11/29/15 1935 11/29/15 2313 11/30/15 0538  TROPONINI 6.47* 6.01* 4.98*   ------------------------------------------------------------------------------------------------------------------  RADIOLOGY:  No results found.   ASSESSMENT AND PLAN:   65 year old female with history of essential hypertension and tobacco abuse who presents with weakness over the past several days and found to have atrial  fibrillation and RVR and sepsis from urinary tract infection.  1. Sepsis: Patient presented with leukocytosis, fever, tachycardia. Sepsis is secondary UTI/cellulitis/Right lower lobe pneumonia (seen on CT abdomen) Urine culture: ESCHERICHIA COLI. Blood cultures are negative Continue IV  cefazolin per ID recommendation Lower extreme he Doppler negative for DVT -Patient currently still on IV heparin drip.  Per vasculer surgery consult, Patient at high risk for PAD due to multiple co-morbidities. No immediate intervention necessary. Can follow up as outpatient in our clinic with ABI's, recommend starting weekly zinc oxide three layer unna wraps.   2. Acute Non-ST elevation MI with peak troponin 6.47: She was on heparin drip. continue metoprolol atorvastatin, and aspirin. Echocardiogram shows mild diastolic dysfunction with EF of 50% As per cardiology due to sepsis and acute kidney injury no indication for cardiac catheterization at this time Per Dr. Humphrey Rolls, discontinue heparin drip and start Eliquis.  3. Atrial fibrillation with RVR:  converted to sinus tachycardia, but Aifb today. Rate is controlled. Discontinue heparin drip, start Eliquis and continue PO amiodarone and metoprolol. TSH is low, but T3 and T4 normal Suggest repeating TFTs in 4 weeks.  4. Acute kidney injury due to ATN from atrial fibrillation, sepsis and non-ST elevation MI. improved with IV fluids.  5. Acute hypoxic respiratory failure: Chest x-ray is not convincing for pneumonia or CHF. VQ scan negative for PE. Improved, off O2 [place].  6. Glaucoma: Continue eyedrops.  7. Anemia of chronic disease. Patient was evaluated by hematology. Recommends outpatient follow-up. No indication for transfusion at present.  Management plans discussed with the patient and she is in agreement.  CODE STATUS: FULL  TOTAL TIME TAKING CARE OF THIS PATIENT: 35 minutes.   D/C in 1-2 days DEPENDING ON CLINICAL CONDITION.   Demetrios Loll M.D on 12/04/2015 at 1:50 PM  Between 7am to 6pm - Pager - [phone] After 6pm go to [email or web address] - password [email or web address] [facility]  Office  [phone]  CC: Primary care physician; Madelyn Brunner, MD  Note: This dictation was prepared with Dragon dictation along  with smaller phrase technology. Any transcriptional errors that result from this process are unintentional.

## 2015-12-04 NOTE — Progress Notes (Signed)
PT changed recommendation from home health to SNF. Clinical Social Worker (CSW) met with patient alone at bedside and discussed SNF option. Patient is agreeable to SNF search in [place]. SNF listed provided. CSW explained that patient's insurance [employer] will have to approve SNF stay. FL2 complete and faxed out. CSW started [employer] authorization. CSW will continue to follow and assist as needed.   Bailey Morgan, LCSW [phone] 

## 2015-12-05 LAB — BASIC METABOLIC PANEL
ANION GAP: 7 (ref 5–15)
BUN: 27 mg/dL — ABNORMAL HIGH (ref 6–20)
CO2: 26 mmol/L (ref 22–32)
Calcium: 8.4 mg/dL — ABNORMAL LOW (ref 8.9–10.3)
Chloride: 106 mmol/L (ref 101–111)
Creatinine, Ser: 1.23 mg/dL — ABNORMAL HIGH (ref 0.44–1.00)
GFR, EST AFRICAN AMERICAN: 52 mL/min — AB (ref >60)
GFR, EST NON AFRICAN AMERICAN: 45 mL/min — AB (ref >60)
Glucose, Bld: 130 mg/dL — ABNORMAL HIGH (ref 65–99)
POTASSIUM: 4.1 mmol/L (ref 3.5–5.1)
SODIUM: 139 mmol/L (ref 135–145)

## 2015-12-05 LAB — CBC
HEMATOCRIT: 32.8 % — AB (ref 35.0–47.0)
HEMOGLOBIN: 10.7 g/dL — AB (ref 12.0–16.0)
MCH: 29.5 pg (ref 26.0–34.0)
MCHC: 32.7 g/dL (ref 32.0–36.0)
MCV: 90.1 fL (ref 80.0–100.0)
Platelets: 210 10*3/uL (ref 150–440)
RBC: 3.64 MIL/uL — ABNORMAL LOW (ref 3.80–5.20)
RDW: 16.1 % — AB (ref 11.5–14.5)
WBC: 14.8 10*3/uL — AB (ref 3.6–11.0)

## 2015-12-05 LAB — PHOSPHORUS: Phosphorus: 3 mg/dL (ref 2.5–4.6)

## 2015-12-05 MED ORDER — LOSARTAN POTASSIUM 25 MG PO TABS
25.0000 mg | ORAL_TABLET | Freq: Every day | ORAL | Status: DC
  Administered 2015-12-05 – 2015-12-06 (×2): 25 mg via ORAL
  Filled 2015-12-05 (×2): qty 1

## 2015-12-05 NOTE — Progress Notes (Signed)
ANTICOAGULATION CONSULT NOTE - Initial Consult  Pharmacy Consult for apixaban Indication: atrial fibrillation  Allergies  Allergen Reactions  . Contrast Media [Iodinated Diagnostic Agents] Other (See Comments)    Flushed, cough, anxiety, mild less responsiveness.    Patient Measurements: Height: 5\' 6"  (167.6 cm) Weight: 219 lb 4.8 oz (99.474 kg) IBW/kg (Calculated) : 59.3  Vital Signs: Temp: 98.5 F (36.9 C) (05/07 0800) Temp Source: Oral (05/07 0800) BP: 187/95 mmHg (05/07 0800) Pulse Rate: 60 (05/07 0800)  Labs:  Recent Labs  12/03/15 0505 12/04/15 1224 12/05/15 0533  HGB 9.8* 10.0* 10.7*  HCT 29.1* 29.9* 32.8*  PLT 131* 160 210  HEPARINUNFRC 0.54  --   --   CREATININE 1.67* 1.21* 1.23*    Estimated Creatinine Clearance: 54.3 mL/min (by C-G formula based on Cr of 1.23).   Medical History: Past Medical History  Diagnosis Date  . Hypertension   . Diabetes mellitus     diagnosed at age 3, on a "pill" (?metformin), but now diet-controlled  . Glaucoma   . Hyperlipidemia     managed with lifestyle modification  . Tobacco abuse     No PFT's or diagnosis of COPD   Assessment: Pharmacy consulted to dose apixaban in this 65 year old female for atrial fibrillation. Patient was previously on a heparin drip.  Age: 65 Wt: 99.5 kg SCr: 1.67 (CrCl 40 mL/min)  Goal of Therapy:  Monitor platelets by anticoagulation protocol: Yes   Plan:  5/6- Discontinue heparin drip at 1100 this morning Order apixaban 5mg  PO BID to begin at 1100 this morning  Pharmacy will continue to monitor CBC. Thank you for the consult.  Noralee Space, PharmD Clinical Pharmacist 12/05/2015,10:51 AM

## 2015-12-05 NOTE — Clinical Social Work Note (Signed)
CSW extended bed offers to patient and patient has chosen [facility]. CSW will call [employer] in the morning to initiate authorization. Shela Leff MSW,LCSW [phone]

## 2015-12-05 NOTE — Progress Notes (Signed)
[facility]  ROUNDING NOTE   Subjective:  Renal function appears to have come back down to her baseline. Creatinine currently 1.2 with a BUN down to 27. Good urine output noted.  Objective:  Vital signs in last 24 hours:  Temp:  [98.2 F (36.8 C)-98.9 F (37.2 C)] 98.2 F (36.8 C) (05/07 1144) Pulse Rate:  [54-60] 54 (05/07 1144) Resp:  [14-20] 20 (05/07 1144) BP: (166-187)/(71-95) 170/72 mmHg (05/07 1144) SpO2:  [94 %-100 %] 99 % (05/07 1144)  Weight change:  Filed Weights   11/29/15 0935 11/29/15 1725  Weight: 97.523 kg (215 lb) 99.474 kg (219 lb 4.8 oz)    Intake/Output: I/O last 3 completed shifts: In: 2419.4 [P.O.:720; I.V.:1199.4; IV Piggyback:500] Out: 1751 [Urine:1750; Stool:1]   Intake/Output this shift:  Total I/O In: 120 [P.O.:120] Out: 200 [Urine:200]  Physical Exam: General: NAD, resting in bed  Head: Normocephalic, atraumatic. Moist oral mucosal membranes  Eyes: Anicteric  Neck: Supple, trachea midline  Lungs:  Clear to auscultation normal effort  Heart: Regular rate and rhythm no rubs  Abdomen:  Soft, nontender, BS present  Extremities: Bilateral LE's wrapped at this time.  Neurologic: Nonfocal, moving all four extremities  Skin: Excoriations b/l LE's, cellulitis of RLE       Basic Metabolic Panel:  Recent Labs Lab 11/29/15 1946 11/30/15 0538 12/01/15 0505 12/01/15 1918 12/02/15 0505 12/03/15 0505 12/04/15 1224 12/05/15 0533  NA  --  141 141  --  136 138 139 139  K  --  3.4* 3.2* 3.4* 3.9 3.9 4.1 4.1  CL  --  108 107  --  105 105 107 106  CO2  --  24 26  --  23 25 25 26   GLUCOSE  --  135* 189*  --  208* 129* 130* 130*  BUN  --  40* 39*  --  48* 47* 33* 27*  CREATININE  --  1.86* 1.67*  --  1.96* 1.67* 1.21* 1.23*  CALCIUM  --  8.4* 8.7*  --  8.6* 8.5* 8.1* 8.4*  MG 2.0 2.0 2.0  --   --   --  2.2  --   PHOS  --  3.4 2.9  --   --   --  2.3* 3.0    Liver Function Tests:  Recent Labs Lab 11/29/15 0937  AST 80*   ALT 24  ALKPHOS 64  BILITOT 1.1  PROT 8.1  ALBUMIN 3.8    Recent Labs Lab 11/29/15 0937  LIPASE 12   No results for input(s): AMMONIA in the last 168 hours.  CBC:  Recent Labs Lab 11/29/15 0937 11/30/15 0538 12/01/15 0505 12/02/15 0505 12/03/15 0505 12/04/15 1224 12/05/15 0533  WBC 16.8* 11.8* 14.9* 16.2* 10.0 10.2 14.8*  NEUTROABS 16.3* 11.0*  --   --   --   --   --   HGB 13.5 11.3* 10.4* 10.3* 9.8* 10.0* 10.7*  HCT 41.0 34.5* 31.7* 31.4* 29.1* 29.9* 32.8*  MCV 90.0 92.1 89.6 90.0 90.6 91.1 90.1  PLT 140* 117* 120* 140* 131* 160 210    Cardiac Enzymes:  Recent Labs Lab 11/29/15 0937 11/29/15 1935 11/29/15 2313 11/30/15 0538  TROPONINI 5.24* 6.47* 6.01* 4.98*    BNP: Invalid input(s): POCBNP  CBG:  Recent Labs Lab 12/02/15 2046  GLUCAP 157*    Microbiology: Results for orders placed or performed during the hospital encounter of 11/29/15  Blood Culture (routine x 2)     Status: None   Collection Time:  11/29/15  9:42 AM  Result Value Ref Range Status   Specimen Description BLOOD LEFT AC  Final   Special Requests   Final    BOTTLES DRAWN AEROBIC AND ANAEROBIC AER 4ML ANA 2ML   Culture NO GROWTH 5 DAYS  Final   Report Status 12/04/2015 FINAL  Final  Blood Culture (routine x 2)     Status: None   Collection Time: 11/29/15  9:44 AM  Result Value Ref Range Status   Specimen Description BLOOD RIGHT AC  Final   Special Requests   Final    BOTTLES DRAWN AEROBIC AND ANAEROBIC ANA 4ML AER 2ML   Culture NO GROWTH 5 DAYS  Final   Report Status 12/04/2015 FINAL  Final  Urine culture     Status: Abnormal   Collection Time: 11/29/15 10:00 AM  Result Value Ref Range Status   Specimen Description URINE, RANDOM  Final   Special Requests NONE  Final   Culture 80,000 COLONIES/mL ESCHERICHIA COLI (A)  Final   Report Status 12/01/2015 FINAL  Final   Organism ID, Bacteria ESCHERICHIA COLI (A)  Final      Susceptibility   Escherichia coli - MIC*     AMPICILLIN <=2 SENSITIVE Sensitive     CEFAZOLIN <=4 SENSITIVE Sensitive     CEFTRIAXONE <=1 SENSITIVE Sensitive     CIPROFLOXACIN <=0.25 SENSITIVE Sensitive     GENTAMICIN <=1 SENSITIVE Sensitive     IMIPENEM <=0.25 SENSITIVE Sensitive     NITROFURANTOIN <=16 SENSITIVE Sensitive     TRIMETH/SULFA <=20 SENSITIVE Sensitive     AMPICILLIN/SULBACTAM <=2 SENSITIVE Sensitive     PIP/TAZO <=4 SENSITIVE Sensitive     Extended ESBL NEGATIVE Sensitive     * 80,000 COLONIES/mL ESCHERICHIA COLI    Coagulation Studies: No results for input(s): LABPROT, INR in the last 72 hours.  Urinalysis: No results for input(s): COLORURINE, LABSPEC, PHURINE, GLUCOSEU, HGBUR, BILIRUBINUR, KETONESUR, PROTEINUR, UROBILINOGEN, NITRITE, LEUKOCYTESUR in the last 72 hours.  Invalid input(s): APPERANCEUR    Imaging: No results found.   Medications:     . amiodarone  200 mg Oral Daily  . apixaban  5 mg Oral BID  . aspirin  81 mg Oral Daily  . atorvastatin  40 mg Oral q1800  . brimonidine  1 drop Both Eyes BID  .  ceFAZolin (ANCEF) IV  2 g Intravenous Q8H  . cloNIDine  0.2 mg Oral Daily  . docusate sodium  100 mg Oral Daily  . ipratropium-albuterol  3 mL Nebulization TID  . latanoprost  1 drop Both Eyes QHS  . metoprolol tartrate  50 mg Oral BID  . polyethylene glycol  17 g Oral Daily  . potassium chloride  40 mEq Oral Daily  . sodium chloride flush  3 mL Intravenous Q12H   acetaminophen **OR** acetaminophen, ipratropium-albuterol, nitroGLYCERIN, ondansetron **OR** ondansetron (ZOFRAN) IV, oxyCODONE  Assessment/ Plan:  65 y.o. female with a PMHX of with a PMHx of hypertension, diabetes mellitus type 2, glaucoma, hyperlipidemia, history of tobacco abuse, who was admitted to [facility] on 11/29/2015 for evaluation of Cellulitis of the right lower extremity as well as malaise. We were asked to see her for evaluation management of acute renal failure in this setting. The patient's baseline creatinine appears to  be 1.2.   1. Acute renal failure/chronic kidney disease stage III baseline creatinine 1.2/diabetes mellitus type 2 with chronic kidney disease/proteinuria. Acute renal failure secondary to concurrent infection as well as use of Aleve and BC powder.  Patient  felt to have extrarenal pelvis per urology. Negative serologic work up. -Renal function continues to improve. Creatinine down to 1.2 and BUN is also trending down.  She was treated with IV fluid hydration but this is been also discontinued. Continue to monitor renal function trend. Patient will need follow-up for her underlying chronic kidney disease.  2. Right lower extremity cellulitis. This was quite extensive upon admission. This is slowly improve with cefazolin therapy. Bilateral lower extremities have been wrapped.  3. Hypertension.  Blood pressure still high at 170/72. We will go ahead and add losartan 25 mg by mouth daily to her medication regimen.  4.  Hypokalemia. Potassium now up to 4.1 and acceptable.  5. Abnormal UPEP/? Anemia of CKD.  Appreciate Dr. Kem Parkinson input.  Further workup has been ordered.   LOS: 6 Imoni Kohen 5/7/201712:46 PM

## 2015-12-05 NOTE — Progress Notes (Signed)
SUBJECTIVE: No chest pain   Filed Vitals:   12/04/15 1949 12/05/15 0500 12/05/15 0735 12/05/15 0800  BP: 166/71 169/79  187/95  Pulse: 60 58  60  Temp: 98.9 F (37.2 C) 98.3 F (36.8 C)  98.5 F (36.9 C)  TempSrc: Oral Oral  Oral  Resp: 14 19  18   Height:      Weight:      SpO2: 100% 100% 94% 100%    Intake/Output Summary (Last 24 hours) at 12/05/15 1140 Last data filed at 12/05/15 0830  Gross per 24 hour  Intake   1670 ml  Output   1101 ml  Net    569 ml    LABS: Basic Metabolic Panel:  Recent Labs  12/04/15 1224 12/05/15 0533  NA 139 139  K 4.1 4.1  CL 107 106  CO2 25 26  GLUCOSE 130* 130*  BUN 33* 27*  CREATININE 1.21* 1.23*  CALCIUM 8.1* 8.4*  MG 2.2  --   PHOS 2.3* 3.0   Liver Function Tests: No results for input(s): AST, ALT, ALKPHOS, BILITOT, PROT, ALBUMIN in the last 72 hours. No results for input(s): LIPASE, AMYLASE in the last 72 hours. CBC:  Recent Labs  12/04/15 1224 12/05/15 0533  WBC 10.2 14.8*  HGB 10.0* 10.7*  HCT 29.9* 32.8*  MCV 91.1 90.1  PLT 160 210   Cardiac Enzymes: No results for input(s): CKTOTAL, CKMB, CKMBINDEX, TROPONINI in the last 72 hours. BNP: Invalid input(s): POCBNP D-Dimer: No results for input(s): DDIMER in the last 72 hours. Hemoglobin A1C: No results for input(s): HGBA1C in the last 72 hours. Fasting Lipid Panel: No results for input(s): CHOL, HDL, LDLCALC, TRIG, CHOLHDL, LDLDIRECT in the last 72 hours. Thyroid Function Tests: No results for input(s): TSH, T4TOTAL, T3FREE, THYROIDAB in the last 72 hours.  Invalid input(s): FREET3 Anemia Panel:  Recent Labs  12/03/15 0505  VITAMINB12 441  FOLATE 7.3  FERRITIN 1458*  TIBC 186*  IRON 41  RETICCTPCT 0.6     PHYSICAL EXAM General: Well developed, well nourished, in no acute distress HEENT:  Normocephalic and atramatic Neck:  No JVD.  Lungs: Clear bilaterally to auscultation and percussion. Heart: HRRR . Normal S1 and S2 without gallops or  murmurs.  Abdomen: Bowel sounds are positive, abdomen soft and non-tender  Msk:  Back normal, normal gait. Normal strength and tone for age. Extremities: No clubbing, cyanosis or edema.   Neuro: Alert and oriented X 3. Psych:  Good affect, responds appropriately  TELEMETRY:Sinus rhythm  ASSESSMENT AND PLAN: Elevated troponin but patient is asymptomatic and cellulitis is gradually improving. Patient needs physical therapy and become ambulatory prior to discharge.  Active Problems:   Sepsis ([place])    Neoma Laming A, MD, [facility] 12/05/2015 11:40 AM     Mode the top

## 2015-12-05 NOTE — Progress Notes (Signed)
Continues to complain of pain to right leg. Sleeping after prn meds.   Tolerating compression dressings.

## 2015-12-05 NOTE — Progress Notes (Signed)
ELECTROLYTE CONSULT NOTE - Follow Up  Pharmacy Consult for electrolyte monitoring and replacement Indication: hypokalemia  Allergies  Allergen Reactions  . Contrast Media [Iodinated Diagnostic Agents] Other (See Comments)    Flushed, cough, anxiety, mild less responsiveness.   Patient Measurements: Height: 5\' 6"  (167.6 cm) Weight: 219 lb 4.8 oz (99.474 kg) IBW/kg (Calculated) : 59.3  Vital Signs: Temp: 98.3 F (36.8 C) (05/07 0500) Temp Source: Oral (05/07 0500) BP: 169/79 mmHg (05/07 0500) Pulse Rate: 58 (05/07 0500) Intake/Output from previous day: 05/06 0701 - 05/07 0700 In: 2219.4 [P.O.:720; I.V.:1199.4; IV Piggyback:300] Out: 1451 [Urine:1450; Stool:1] Intake/Output from this shift:    Estimated Creatinine Clearance: 54.3 mL/min (by C-G formula based on Cr of 1.23).  Medical History: Past Medical History  Diagnosis Date  . Hypertension   . Diabetes mellitus     diagnosed at age 17, on a "pill" (?metformin), but now diet-controlled  . Glaucoma   . Hyperlipidemia     managed with lifestyle modification  . Tobacco abuse     No PFT's or diagnosis of COPD   Assessment: Pharmacy consulted to monitor and replace electrolytes if needed in this 65 year old female admitted with sepsis and new onset atrial fibrillation.  Orders for KCl 40 meq po daily.  5/5: K: 3.9  5/4: K: 3.9 5/3: Mag: 2.0, Phos: 2.9  Goal of Therapy:  Electrolytes within normal limits  Plan:  Continue current orders for KCl 40 meq po daily.  No supplementation warranted at this time. If potassium remains within normal limits may consider monitoring every other day.  Will follow up with BMP/mag/phos in AM.  5/6- K=4.1, Mag= 2.2, Phos= 2.3.  Will give KPHos Neutral 2 tablets po x 1. Will order K/Phos labs for am.  5/7:  K=4.1, Phos= 3.0. Patient on KDUR 85meq po daily. Cotninue current regimen.  Will f/u labs in am.   Noralee Space, PharmD Clinical Pharmacist 12/05/2015,8:28 AM

## 2015-12-05 NOTE — Progress Notes (Signed)
[facility] [place] NAME: Patricia Ewing    MR#:  [id number]  DATE OF BIRTH:  [birth year]  SUBJECTIVE:   still right lower extremity pain.  REVIEW OF SYSTEMS:    Review of Systems  Constitutional: Negative for fever, chills and malaise/fatigue.  HENT: Negative for ear discharge, ear pain, hearing loss, nosebleeds and sore throat.   Eyes: Negative for blurred vision and pain.  Respiratory: Negative for cough, hemoptysis, shortness of breath and wheezing.   Cardiovascular: Negative for chest pain, palpitations and leg swelling.  Gastrointestinal: Negative for nausea, vomiting, abdominal pain, diarrhea and blood in stool.  Genitourinary: Negative for dysuria.  Musculoskeletal: right lower extremity pain.  Skin: no rash or jaundice. Neurological: Negative for dizziness, tremors, speech change, focal weakness, seizures and headaches.  Endo/Heme/Allergies: Does not bruise/bleed easily.  Psychiatric/Behavioral: Negative for depression, suicidal ideas and hallucinations.   Tolerating Diet:yes  DRUG ALLERGIES:   Allergies  Allergen Reactions  . Contrast Media [Iodinated Diagnostic Agents] Other (See Comments)    Flushed, cough, anxiety, mild less responsiveness.    VITALS:  Blood pressure 170/72, pulse 54, temperature 98.2 F (36.8 C), temperature source Oral, resp. rate 20, height 5\' 6"  (1.676 m), weight 99.474 kg (219 lb 4.8 oz), SpO2 99 %.  PHYSICAL EXAMINATION:   Physical Exam  Constitutional: She is oriented to person, place, and time and well-developed, well-nourished, and in no distress. No distress.  HENT:  Head: Normocephalic.  Eyes: No scleral icterus.  Neck: Normal range of motion. Neck supple. No JVD present. No tracheal deviation present.  Cardiovascular: Normal rate, regular rhythm and normal heart sounds.  Exam reveals no gallop and no friction rub.   No murmur heard. Pulmonary/Chest: Effort normal. No respiratory  distress. No wheezing or rales. She exhibits no tenderness.  Abdominal: Soft. Bowel sounds are normal. She exhibits no distension and no mass. There is no tenderness. There is no rebound and no guarding.  Musculoskeletal: Normal range of motion. Bilateral leg in unna wraps. Neurological: She is alert and oriented to person, place, and time.  Skin: Skin is warm.  Psychiatric: Affect and judgment normal.      LABORATORY PANEL:   CBC  Recent Labs Lab 12/05/15 0533  WBC 14.8*  HGB 10.7*  HCT 32.8*  PLT 210   ------------------------------------------------------------------------------------------------------------------  Chemistries   Recent Labs Lab 11/29/15 0937  12/04/15 1224 12/05/15 0533  NA 144  < > 139 139  K 3.2*  < > 4.1 4.1  CL 104  < > 107 106  CO2 23  < > 25 26  GLUCOSE 124*  < > 130* 130*  BUN 40*  < > 33* 27*  CREATININE 2.01*  < > 1.21* 1.23*  CALCIUM 9.2  < > 8.1* 8.4*  MG  --   < > 2.2  --   AST 80*  --   --   --   ALT 24  --   --   --   ALKPHOS 64  --   --   --   BILITOT 1.1  --   --   --   < > = values in this interval not displayed. ------------------------------------------------------------------------------------------------------------------  Cardiac Enzymes  Recent Labs Lab 11/29/15 1935 11/29/15 2313 11/30/15 0538  TROPONINI 6.47* 6.01* 4.98*   ------------------------------------------------------------------------------------------------------------------  RADIOLOGY:  No results found.   ASSESSMENT AND PLAN:   65 year old female with history of essential hypertension and tobacco abuse who presents with weakness over  the past several days and found to have atrial fibrillation and RVR and sepsis from urinary tract infection.  1. Sepsis: Patient presented with leukocytosis, fever, tachycardia. Sepsis is secondary UTI/cellulitis/Right lower lobe pneumonia (seen on CT abdomen) Urine culture: ESCHERICHIA COLI. Blood cultures are  negative Continue IV cefazolin per ID recommendation Lower extreme he Doppler negative for DVT -Patient was on heparin drip.  Per vasculer surgery consult, Patient at high risk for PAD due to multiple co-morbidities. No immediate intervention necessary. Can follow up as outpatient in our clinic with ABI's, recommend starting weekly zinc oxide three layer unna wraps. Leukocytosis today, f/u CBC.  2. Acute Non-ST elevation MI with peak troponin 6.47: She was on heparin drip. continue metoprolol atorvastatin, and aspirin. Echocardiogram shows mild diastolic dysfunction with EF of 50% As per cardiology due to sepsis and acute kidney injury no indication for cardiac catheterization at this time Per Dr. Humphrey Rolls, discontinue heparin drip and start Eliquis.  3. Atrial fibrillation with RVR:  converted to sinus tachycardia,  Rate is controlled. Discontinue heparin drip, start Eliquis and continue PO amiodarone and metoprolol. TSH is low, but T3 and T4 normal Suggest repeating TFTs in 4 weeks.  4. Acute kidney injury due to ATN from atrial fibrillation, sepsis and non-ST elevation MI. improved with IV fluids.  5. Acute hypoxic respiratory failure: Chest x-ray is not convincing for pneumonia or CHF. VQ scan negative for PE. Improved, off O2 [place].  6. Glaucoma: Continue eyedrops.  7. Anemia of chronic disease. Patient was evaluated by hematology. Recommends outpatient follow-up. No indication for transfusion at present.  Management plans discussed with the patient and she is in agreement.  CODE STATUS: FULL  TOTAL TIME TAKING CARE OF THIS PATIENT: 35 minutes.   D/C in 1-2 days DEPENDING ON CLINICAL CONDITION.   Demetrios Loll M.D on 12/05/2015 at 12:45 PM  Between 7am to 6pm - Pager - [phone] After 6pm go to [email or web address] - password [email or web address] [facility]  Office  [phone]  CC: Primary care physician; Madelyn Brunner, MD  Note: This dictation was prepared with  Dragon dictation along with smaller phrase technology. Any transcriptional errors that result from this process are unintentional.

## 2015-12-06 LAB — CBC
HEMATOCRIT: 28.8 % — AB (ref 35.0–47.0)
Hemoglobin: 9.8 g/dL — ABNORMAL LOW (ref 12.0–16.0)
MCH: 30.4 pg (ref 26.0–34.0)
MCHC: 34 g/dL (ref 32.0–36.0)
MCV: 89.5 fL (ref 80.0–100.0)
Platelets: 209 10*3/uL (ref 150–440)
RBC: 3.21 MIL/uL — ABNORMAL LOW (ref 3.80–5.20)
RDW: 15.8 % — AB (ref 11.5–14.5)
WBC: 15.3 10*3/uL — AB (ref 3.6–11.0)

## 2015-12-06 LAB — UIFE/LIGHT CHAINS/TP QN, 24-HR UR
% BETA, Urine: 29.9 %
ALPHA 1 URINE: 5.5 %
Albumin, U: 17.2 %
Alpha 2, Urine: 33.3 %
Free Kappa/Lambda Ratio: 5.66 (ref 2.04–10.37)
Free Lambda Lt Chains,Ur: 56.4 mg/L — ABNORMAL HIGH (ref 0.24–6.66)
Free Lt Chn Excr Rate: 319 mg/L — ABNORMAL HIGH (ref 1.35–24.19)
GAMMA GLOBULIN URINE: 14.1 %
Total Protein, Urine: 30.7 mg/dL

## 2015-12-06 LAB — BASIC METABOLIC PANEL
Anion gap: 9 (ref 5–15)
BUN: 19 mg/dL (ref 6–20)
CHLORIDE: 105 mmol/L (ref 101–111)
CO2: 24 mmol/L (ref 22–32)
CREATININE: 0.98 mg/dL (ref 0.44–1.00)
Calcium: 8 mg/dL — ABNORMAL LOW (ref 8.9–10.3)
GFR, EST NON AFRICAN AMERICAN: 59 mL/min — AB (ref >60)
Glucose, Bld: 115 mg/dL — ABNORMAL HIGH (ref 65–99)
POTASSIUM: 4.1 mmol/L (ref 3.5–5.1)
SODIUM: 138 mmol/L (ref 135–145)

## 2015-12-06 LAB — IGG, IGA, IGM
IgA: 159 mg/dL (ref 87–352)
IgG (Immunoglobin G), Serum: 1025 mg/dL (ref 700–1600)
IgM, Serum: 54 mg/dL (ref 26–217)

## 2015-12-06 LAB — KAPPA/LAMBDA LIGHT CHAINS
Kappa free light chain: 27.93 mg/L — ABNORMAL HIGH (ref 3.30–19.40)
Kappa, lambda light chain ratio: 1.15 (ref 0.26–1.65)
Lambda free light chains: 24.25 mg/L (ref 5.71–26.30)

## 2015-12-06 LAB — PHOSPHORUS: PHOSPHORUS: 3 mg/dL (ref 2.5–4.6)

## 2015-12-06 MED ORDER — NITROGLYCERIN 0.4 MG SL SUBL: 0.4000 mg | SUBLINGUAL_TABLET | SUBLINGUAL | Status: AC | PRN

## 2015-12-06 MED ORDER — LOSARTAN POTASSIUM 25 MG PO TABS: 25.0000 mg | ORAL_TABLET | Freq: Every day | ORAL | Status: DC

## 2015-12-06 MED ORDER — POLYETHYLENE GLYCOL 3350 17 G PO PACK: 17.0000 g | PACK | Freq: Every day | ORAL | Status: DC

## 2015-12-06 MED ORDER — APIXABAN 5 MG PO TABS: 5.0000 mg | ORAL_TABLET | Freq: Two times a day (BID) | ORAL | Status: DC

## 2015-12-06 MED ORDER — CEPHALEXIN 500 MG PO CAPS
500.0000 mg | ORAL_CAPSULE | Freq: Three times a day (TID) | ORAL | Status: DC
  Administered 2015-12-06: 500 mg via ORAL
  Filled 2015-12-06: qty 1

## 2015-12-06 MED ORDER — CEPHALEXIN 500 MG PO CAPS: 500.0000 mg | ORAL_CAPSULE | Freq: Three times a day (TID) | ORAL | Status: DC

## 2015-12-06 MED ORDER — AMIODARONE HCL 200 MG PO TABS: 200.0000 mg | ORAL_TABLET | Freq: Every day | ORAL | Status: DC

## 2015-12-06 MED ORDER — OXYCODONE HCL 5 MG PO TABS: 5.0000 mg | ORAL_TABLET | Freq: Four times a day (QID) | ORAL | Status: DC | PRN

## 2015-12-06 MED ORDER — METOPROLOL TARTRATE 50 MG PO TABS: 50.0000 mg | ORAL_TABLET | Freq: Two times a day (BID) | ORAL | Status: DC

## 2015-12-06 MED ORDER — ATORVASTATIN CALCIUM 40 MG PO TABS: 40.0000 mg | ORAL_TABLET | Freq: Every day | ORAL | Status: DC

## 2015-12-06 NOTE — Progress Notes (Signed)
ELECTROLYTE CONSULT NOTE - Follow Up  Pharmacy Consult for electrolyte monitoring and replacement Indication: hypokalemia  Allergies  Allergen Reactions  . Contrast Media [Iodinated Diagnostic Agents] Other (See Comments)    Flushed, cough, anxiety, mild less responsiveness.   Patient Measurements: Height: 5\' 6"  (167.6 cm) Weight: 219 lb 4.8 oz (99.474 kg) IBW/kg (Calculated) : 59.3  Vital Signs: Temp: 98.4 F (36.9 C) (05/08 0401) Temp Source: Oral (05/08 0401) BP: 164/76 mmHg (05/08 0524) Pulse Rate: 55 (05/08 0524) Intake/Output from previous day: 05/07 0701 - 05/08 0700 In: 660 [P.O.:360; IV Piggyback:300] Out: 626 [Urine:625; Stool:1] Intake/Output from this shift:    Estimated Creatinine Clearance: 68.1 mL/min (by C-G formula based on Cr of 0.98).  Medical History: Past Medical History  Diagnosis Date  . Hypertension   . Diabetes mellitus     diagnosed at age 69, on a "pill" (?metformin), but now diet-controlled  . Glaucoma   . Hyperlipidemia     managed with lifestyle modification  . Tobacco abuse     No PFT's or diagnosis of COPD   Assessment: Pharmacy consulted to monitor and replace electrolytes if needed in this 65 year old female admitted with sepsis and new onset atrial fibrillation.  Orders for KCl 40 meq po daily.  5/8: K=4.1, Phos=3.0  Goal of Therapy:  Electrolytes within normal limits  Plan:  Continue current orders for KCl 40 meq po daily.  No supplementation warranted at this time. If potassium remains within normal limits may consider monitoring every other day.  Will follow up with BMP in AM.    Loleta Dicker, PharmD Clinical Pharmacist 12/06/2015,7:37 AM

## 2015-12-06 NOTE — Progress Notes (Signed)
Physical Therapy Treatment Patient Details Name: Patricia Ewing MRN: [id number] DOB: 11/12/1950 Today's Date: 12/06/2015    History of Present Illness Pt is a 65 y.o. F admitted to hospital for sepsis, A-fib and acute NSTEMI. Since being in hospital, pt developed R LE cellulitis. Pt has hx of HTN, DM, glaucoma, and hyperlipidemia.     PT Comments    Pt is progressing towards goals. Pt stated R LE in less pain today, swelling improved. Pt able to perform bed mobility with min assist. Pt able to transfer and ambulate approx 30 ft using RW and min assist. Pt demonstrated improved gait with use of RW, requiring heavy cues for walker placement. Pt performed supine there-ex on B LE with min to no assist. Pt demonstrates deficits in strength and mobility. Pt would benefit from further skilled PT to address deficits and return to PLOF; recommend pt sent to SNF after discharge from acute hospitalization.   Follow Up Recommendations  SNF     Equipment Recommendations  Rolling walker with 5" wheels    Recommendations for Other Services       Precautions / Restrictions Precautions Precautions: Fall Restrictions Weight Bearing Restrictions: No    Mobility  Bed Mobility Overal bed mobility: Needs Assistance Bed Mobility: Supine to Sit     Supine to sit: Min assist     General bed mobility comments: Pt able to perform bed mobility with min assist and use of bed rails. Pt requires assist with R LE and scooting to EOB. Pt required increased time to perform mobility.   Transfers Overall transfer level: Needs assistance Equipment used: Rolling walker (2 wheeled) Transfers: Sit to/from Stand Sit to Stand: Min assist         General transfer comment: Pt able to transfer from EOB using RW and min assist. Pt required increased time to rise and upright posture. Pt provided cues regarding hand placement. Once standing, pt required increased time to maintain WB in R LE.    Ambulation/Gait Ambulation/Gait assistance: Min assist Ambulation Distance (Feet): 30 Feet Assistive device: Rolling walker (2 wheeled) Gait Pattern/deviations: Step-through pattern Gait velocity: decreased   General Gait Details: Pt able to ambulate approx 30 ft in room using RW and min assist. Pt demonstrated slow step-through gait pattern. Pt appeared unsteady, however no LOB. Pt required increased use of B UE for WB with RW. Pt provided cues regarding walker placement around objects in room.    Stairs            Wheelchair Mobility    Modified Rankin (Stroke Patients Only)       Balance Overall balance assessment: Modified Independent (able to maintain standing w/RW)                                  Cognition Arousal/Alertness: Awake/alert Behavior During Therapy: WFL for tasks assessed/performed Overall Cognitive Status: Within Functional Limits for tasks assessed                      Exercises Other Exercises Other Exercises: Pt performed supine ther-ex on B LE including SLR and SAQ with min assist, ankle pumps and quad sets with no assist. All ther-ex performed x12 reps.     General Comments        Pertinent Vitals/Pain Pain Assessment: 0-10 Pain Score: 8  Pain Location: R LE Pain Descriptors / Indicators: Aching Pain Intervention(s): Limited activity  within patient's tolerance    Home Living                      Prior Function            PT Goals (current goals can now be found in the care plan section) Acute Rehab PT Goals Patient Stated Goal: to return to PLOF PT Goal Formulation: With patient Time For Goal Achievement: 12/14/15 Potential to Achieve Goals: Fair Progress towards PT goals: Progressing toward goals    Frequency  Min 2X/week    PT Plan Current plan remains appropriate    Co-evaluation             End of Session Equipment Utilized During Treatment: Gait belt Activity Tolerance:  Patient limited by pain Patient left: in chair;with call bell/phone within reach;with family/visitor present     Time: [id number] PT Time Calculation (min) (ACUTE ONLY): 23 min  Charges:                       G Codes:      Sherral Hammers 12/18/2015, 11:06 AM M. Barnett Abu, SPT

## 2015-12-06 NOTE — Progress Notes (Signed)
[facility]  ROUNDING NOTE   Subjective:  Laying in bed Creatinine 0.98 (1.2) UOP 925  On potassium chloride  Objective:  Vital signs in last 24 hours:  Temp:  [98.2 F (36.8 C)-98.4 F (36.9 C)] 98.4 F (36.9 C) (05/08 0401) Pulse Rate:  [54-66] 58 (05/08 0743) Resp:  [18-21] 20 (05/08 0743) BP: (164-190)/(72-95) 184/88 mmHg (05/08 0743) SpO2:  [95 %-99 %] 95 % (05/08 0743)  Weight change:  Filed Weights   11/29/15 0935 11/29/15 1725  Weight: 97.523 kg (215 lb) 99.474 kg (219 lb 4.8 oz)    Intake/Output: I/O last 3 completed shifts: In: 54 [P.O.:360; IV Piggyback:500] Out: [id number] [Urine:1725; Stool:2]   Intake/Output this shift:     Physical Exam: General: NAD, resting in bed  Head: Normocephalic, atraumatic. Moist oral mucosal membranes  Eyes: Anicteric  Neck: Supple, trachea midline  Lungs:  Clear to auscultation normal effort  Heart: Regular rate and rhythm no rubs  Abdomen:  Soft, nontender, BS present  Extremities: Bilateral LE's wrapped   Neurologic: Nonfocal, moving all four extremities  Skin: Excoriations b/l LE's, cellulitis of RLE       Basic Metabolic Panel:  Recent Labs Lab 11/29/15 1946 11/30/15 0538 12/01/15 0505  12/02/15 0505 12/03/15 0505 12/04/15 1224 12/05/15 0533 12/06/15 0601  NA  --  141 141  --  136 138 139 139 138  K  --  3.4* 3.2*  < > 3.9 3.9 4.1 4.1 4.1  CL  --  108 107  --  105 105 107 106 105  CO2  --  24 26  --  23 25 25 26 24   GLUCOSE  --  135* 189*  --  208* 129* 130* 130* 115*  BUN  --  40* 39*  --  48* 47* 33* 27* 19  CREATININE  --  1.86* 1.67*  --  1.96* 1.67* 1.21* 1.23* 0.98  CALCIUM  --  8.4* 8.7*  --  8.6* 8.5* 8.1* 8.4* 8.0*  MG 2.0 2.0 2.0  --   --   --  2.2  --   --   PHOS  --  3.4 2.9  --   --   --  2.3* 3.0 3.0  < > = values in this interval not displayed.  Liver Function Tests:  Recent Labs Lab 11/29/15 0937  AST 80*  ALT 24  ALKPHOS 64  BILITOT 1.1  PROT 8.1  ALBUMIN 3.8     Recent Labs Lab 11/29/15 0937  LIPASE 12   No results for input(s): AMMONIA in the last 168 hours.  CBC:  Recent Labs Lab 11/29/15 0937 11/30/15 0538  12/02/15 0505 12/03/15 0505 12/04/15 1224 12/05/15 0533 12/06/15 0601  WBC 16.8* 11.8*  < > 16.2* 10.0 10.2 14.8* 15.3*  NEUTROABS 16.3* 11.0*  --   --   --   --   --   --   HGB 13.5 11.3*  < > 10.3* 9.8* 10.0* 10.7* 9.8*  HCT 41.0 34.5*  < > 31.4* 29.1* 29.9* 32.8* 28.8*  MCV 90.0 92.1  < > 90.0 90.6 91.1 90.1 89.5  PLT 140* 117*  < > 140* 131* 160 210 209  < > = values in this interval not displayed.  Cardiac Enzymes:  Recent Labs Lab 11/29/15 0937 11/29/15 1935 11/29/15 2313 11/30/15 0538  TROPONINI 5.24* 6.47* 6.01* 4.98*    BNP: Invalid input(s): POCBNP  CBG:  Recent Labs Lab 12/02/15 2046  GLUCAP 157*  Microbiology: Results for orders placed or performed during the hospital encounter of 11/29/15  Blood Culture (routine x 2)     Status: None   Collection Time: 11/29/15  9:42 AM  Result Value Ref Range Status   Specimen Description BLOOD LEFT AC  Final   Special Requests   Final    BOTTLES DRAWN AEROBIC AND ANAEROBIC AER 4ML ANA 2ML   Culture NO GROWTH 5 DAYS  Final   Report Status 12/04/2015 FINAL  Final  Blood Culture (routine x 2)     Status: None   Collection Time: 11/29/15  9:44 AM  Result Value Ref Range Status   Specimen Description BLOOD RIGHT AC  Final   Special Requests   Final    BOTTLES DRAWN AEROBIC AND ANAEROBIC ANA 4ML AER 2ML   Culture NO GROWTH 5 DAYS  Final   Report Status 12/04/2015 FINAL  Final  Urine culture     Status: Abnormal   Collection Time: 11/29/15 10:00 AM  Result Value Ref Range Status   Specimen Description URINE, RANDOM  Final   Special Requests NONE  Final   Culture 80,000 COLONIES/mL ESCHERICHIA COLI (A)  Final   Report Status 12/01/2015 FINAL  Final   Organism ID, Bacteria ESCHERICHIA COLI (A)  Final      Susceptibility   Escherichia coli - MIC*     AMPICILLIN <=2 SENSITIVE Sensitive     CEFAZOLIN <=4 SENSITIVE Sensitive     CEFTRIAXONE <=1 SENSITIVE Sensitive     CIPROFLOXACIN <=0.25 SENSITIVE Sensitive     GENTAMICIN <=1 SENSITIVE Sensitive     IMIPENEM <=0.25 SENSITIVE Sensitive     NITROFURANTOIN <=16 SENSITIVE Sensitive     TRIMETH/SULFA <=20 SENSITIVE Sensitive     AMPICILLIN/SULBACTAM <=2 SENSITIVE Sensitive     PIP/TAZO <=4 SENSITIVE Sensitive     Extended ESBL NEGATIVE Sensitive     * 80,000 COLONIES/mL ESCHERICHIA COLI    Coagulation Studies: No results for input(s): LABPROT, INR in the last 72 hours.  Urinalysis: No results for input(s): COLORURINE, LABSPEC, PHURINE, GLUCOSEU, HGBUR, BILIRUBINUR, KETONESUR, PROTEINUR, UROBILINOGEN, NITRITE, LEUKOCYTESUR in the last 72 hours.  Invalid input(s): APPERANCEUR    Imaging: No results found.   Medications:     . amiodarone  200 mg Oral Daily  . apixaban  5 mg Oral BID  . aspirin  81 mg Oral Daily  . atorvastatin  40 mg Oral q1800  . brimonidine  1 drop Both Eyes BID  .  ceFAZolin (ANCEF) IV  2 g Intravenous Q8H  . cloNIDine  0.2 mg Oral Daily  . docusate sodium  100 mg Oral Daily  . ipratropium-albuterol  3 mL Nebulization TID  . latanoprost  1 drop Both Eyes QHS  . losartan  25 mg Oral Daily  . metoprolol tartrate  50 mg Oral BID  . polyethylene glycol  17 g Oral Daily  . potassium chloride  40 mEq Oral Daily  . sodium chloride flush  3 mL Intravenous Q12H   acetaminophen **OR** acetaminophen, ipratropium-albuterol, nitroGLYCERIN, ondansetron **OR** ondansetron (ZOFRAN) IV, oxyCODONE  Assessment/ Plan:  65 y.o. female with a PMHX of with a PMHx of hypertension, diabetes mellitus type 2, glaucoma, hyperlipidemia, history of tobacco abuse, who was admitted to [facility] on 11/29/2015 for evaluation of Cellulitis of the right lower extremity as well as malaise.  1. Acute renal failure/chronic kidney disease stage III baseline creatinine 1.2/diabetes mellitus  type 2 with chronic kidney disease/proteinuria. Acute renal failure secondary to concurrent  infection as well as use of Aleve and BC powder.  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 Negative serologic work up. -Renal function back to baseline.   2. Right lower extremity cellulitis. with e. Coli in urine - cefazolin IV   3. Hypertension: elevated.  - clonidine, losartan, metoprolol  4.  Hypokalemia. Potassium now at goal - continue potassium chloride.   5. Anemia with renal failure: abnormal UPEP: hemoglobin 9.8 - Dr. Mike Gip following.    LOS: [place], Patricia Ewing 5/8/20179:13 AM

## 2015-12-06 NOTE — Discharge Instructions (Signed)
Heart healthy diet. Activity as tolerated. Fall precaution. Wound care.

## 2015-12-06 NOTE — Discharge Summary (Signed)
[facility] NAME: Patricia Ewing    MR#:  [id number]  DATE OF BIRTH:  [birth year]  DATE OF ADMISSION:  11/29/2015 ADMITTING PHYSICIAN: Fritzi Mandes, MD  DATE OF DISCHARGE: 12/06/2015 PRIMARY CARE PHYSICIAN: Madelyn Brunner, MD    ADMISSION DIAGNOSIS:  Acute renal insufficiency [N28.9] New onset atrial fibrillation ([place]) [I48.91] NSTEMI (non-ST elevated myocardial infarction) ([place]) [I21.4] Atrial fibrillation with rapid ventricular response ([place]) [I48.91] Acute cystitis without hematuria [N30.00] Sepsis, due to unspecified organism ([place]) [A41.9]   DISCHARGE DIAGNOSIS:  Sepsis is secondary UTI/ leg cellulitis/ Right lower lobe pneumonia Acute Non-ST elevation MI  Atrial fibrillation with RVR Acute kidney injury due to ATN   Acute hypoxic respiratory failure SECONDARY DIAGNOSIS:   Past Medical History  Diagnosis Date  . Hypertension   . Diabetes mellitus     diagnosed at age 89, on a "pill" (?metformin), but now diet-controlled  . Glaucoma   . Hyperlipidemia     managed with lifestyle modification  . Tobacco abuse     No PFT's or diagnosis of COPD    HOSPITAL COURSE:  65 year old female with history of essential hypertension and tobacco abuse who presents with weakness over the past several days and found to have atrial fibrillation and RVR and sepsis from urinary tract infection.  1. Sepsis: Patient presented with leukocytosis, fever, tachycardia. Sepsis is secondary UTI/cellulitis/Right lower lobe pneumonia (seen on CT abdomen) Urine culture: ESCHERICHIA COLI. Blood cultures are negative Continue IV cefazolin per ID recommendation Lower extreme he Doppler negative for DVT -Patient was on heparin drip.  Per vasculer surgery consult, Patient at high risk for PAD due to multiple co-morbidities. No immediate intervention necessary. Can follow up as outpatient in our clinic with ABI's, recommend starting weekly zinc  oxide three layer unna wraps. Leukocytosis, f/u CBC as outpatient.  2. Acute Non-ST elevation MI with peak troponin 6.47: She was on heparin drip. continue metoprolol atorvastatin, and aspirin. Echocardiogram shows mild diastolic dysfunction with EF of 50% As per cardiology due to sepsis and acute kidney injury no indication for cardiac catheterization at this time Per Dr. Humphrey Rolls, discontinued heparin drip and started Eliquis.  3. Atrial fibrillation with RVR: converted to sinus tachycardia, Rate is controlled. Discontinue heparin drip, started Eliquis and continue PO amiodarone and metoprolol. TSH is low, but T3 and T4 normal Suggest repeating TFTs in 4 weeks.  4. Acute kidney injury due to ATN from atrial fibrillation, sepsis and non-ST elevation MI. improved with IV fluids.  5. Acute hypoxic respiratory failure: Chest x-ray is not convincing for pneumonia or CHF. VQ scan negative for PE. Improved, off O2 [place].  6. Glaucoma: Continue eyedrops.  7. Anemia of chronic disease. Patient was evaluated by hematology. Recommends outpatient follow-up. No indication for transfusion at present.  DISCHARGE CONDITIONS:  Stable, discharge to SNF today.  CONSULTS OBTAINED:  Treatment Team:  Dionisio David, MD Anthonette Legato, MD Leonel Ramsay, MD Lequita Asal, MD Nickie Retort, MD Katha Cabal, MD  DRUG ALLERGIES:   Allergies  Allergen Reactions  . Contrast Media [Iodinated Diagnostic Agents] Other (See Comments)    Flushed, cough, anxiety, mild less responsiveness.    DISCHARGE MEDICATIONS:   Current Discharge Medication List    START taking these medications   Details  amiodarone (PACERONE) 200 MG tablet Take 1 tablet (200 mg total) by mouth daily.    apixaban (ELIQUIS) 5 MG TABS tablet Take 1 tablet (5  mg total) by mouth 2 (two) times daily. Qty: 60 tablet    atorvastatin (LIPITOR) 40 MG tablet Take 1 tablet (40 mg total) by mouth daily at 6 PM.     cephALEXin (KEFLEX) 500 MG capsule Take 1 capsule (500 mg total) by mouth every 8 (eight) hours. Qty: 30 capsule, Refills: 0    losartan (COZAAR) 25 MG tablet Take 1 tablet (25 mg total) by mouth daily.    metoprolol (LOPRESSOR) 50 MG tablet Take 1 tablet (50 mg total) by mouth 2 (two) times daily.    nitroGLYCERIN (NITROSTAT) 0.4 MG SL tablet Place 1 tablet (0.4 mg total) under the tongue every 5 (five) minutes as needed for chest pain.    oxyCODONE (OXY IR/ROXICODONE) 5 MG immediate release tablet Take 1 tablet (5 mg total) by mouth every 6 (six) hours as needed for moderate pain. Qty: 16 tablet, Refills: 0    polyethylene glycol (MIRALAX / GLYCOLAX) packet Take 17 g by mouth daily. Qty: 14 each, Refills: 0      CONTINUE these medications which have NOT CHANGED   Details  albuterol (PROVENTIL HFA;VENTOLIN HFA) 108 (90 BASE) MCG/ACT inhaler Inhale 1 puff into the lungs every 6 (six) hours as needed. Shortness of breath    aspirin 81 MG tablet Take 81 mg by mouth daily.    brimonidine (ALPHAGAN) 0.2 % ophthalmic solution Place 1 drop into both eyes 2 (two) times daily.    cetirizine (ZYRTEC) 10 MG tablet Take 10 mg by mouth daily as needed. allergies    cloNIDine (CATAPRES) 0.2 MG tablet Take 0.2 mg by mouth daily.    latanoprost (XALATAN) 0.005 % ophthalmic solution Place 1 drop into both eyes.      STOP taking these medications     furosemide (LASIX) 40 MG tablet      potassium chloride SA (K-DUR,KLOR-CON) 20 MEQ tablet          DISCHARGE INSTRUCTIONS:    If you experience worsening of your admission symptoms, develop shortness of breath, life threatening emergency, suicidal or homicidal thoughts you must seek medical attention immediately by calling 911 or calling your MD immediately  if symptoms less severe.  You Must read complete instructions/literature along with all the possible adverse reactions/side effects for all the Medicines you take and that have been  prescribed to you. Take any new Medicines after you have completely understood and accept all the possible adverse reactions/side effects.   Please note  You were cared for by a hospitalist during your hospital stay. If you have any questions about your discharge medications or the care you received while you were in the hospital after you are discharged, you can call the unit and asked to speak with the hospitalist on call if the hospitalist that took care of you is not available. Once you are discharged, your primary care physician will handle any further medical issues. Please note that NO REFILLS for any discharge medications will be authorized once you are discharged, as it is imperative that you return to your primary care physician (or establish a relationship with a primary care physician if you do not have one) for your aftercare needs so that they can reassess your need for medications and monitor your lab values.    Today   SUBJECTIVE   Right leg pain.   VITAL SIGNS:  Blood pressure 157/64, pulse 54, temperature 98.3 F (36.8 C), temperature source Oral, resp. rate 20, height 5\' 6"  (1.676 m), weight 99.474 kg (  219 lb 4.8 oz), SpO2 96 %.  I/O:   Intake/Output Summary (Last 24 hours) at 12/06/15 1138 Last data filed at 12/06/15 0536  Gross per 24 hour  Intake    300 ml  Output    626 ml  Net   -326 ml    PHYSICAL EXAMINATION:  GENERAL:  65 y.o.-year-old patient lying in the bed with no acute distress.  EYES: Pupils equal, round, reactive to light and accommodation. No scleral icterus. Extraocular muscles intact.  HEENT: Head atraumatic, normocephalic. Oropharynx and nasopharynx clear.  NECK:  Supple, no jugular venous distention. No thyroid enlargement, no tenderness.  LUNGS: Normal breath sounds bilaterally, no wheezing, rales,rhonchi or crepitation. No use of accessory muscles of respiration.  CARDIOVASCULAR: S1, S2 normal. No murmurs, rubs, or gallops.  ABDOMEN: Soft,  non-tender, non-distended. Bowel sounds present. No organomegaly or mass.  EXTREMITIES: No cyanosis, or clubbing. Bilateral leg in unna wraps. NEUROLOGIC: Cranial nerves II through XII are intact. Muscle strength 3-4/5 in all extremities. Sensation intact. Gait not checked.  PSYCHIATRIC: The patient is alert and oriented x 3.  SKIN: No obvious rash, lesion, or ulcer.   DATA REVIEW:   CBC  Recent Labs Lab 12/06/15 0601  WBC 15.3*  HGB 9.8*  HCT 28.8*  PLT 209    Chemistries   Recent Labs Lab 12/04/15 1224  12/06/15 0601  NA 139  < > 138  K 4.1  < > 4.1  CL 107  < > 105  CO2 25  < > 24  GLUCOSE 130*  < > 115*  BUN 33*  < > 19  CREATININE 1.21*  < > 0.98  CALCIUM 8.1*  < > 8.0*  MG 2.2  --   --   < > = values in this interval not displayed.  Cardiac Enzymes  Recent Labs Lab 11/30/15 0538  TROPONINI 4.98*    Microbiology Results  Results for orders placed or performed during the hospital encounter of 11/29/15  Blood Culture (routine x 2)     Status: None   Collection Time: 11/29/15  9:42 AM  Result Value Ref Range Status   Specimen Description BLOOD LEFT AC  Final   Special Requests   Final    BOTTLES DRAWN AEROBIC AND ANAEROBIC AER 4ML ANA 2ML   Culture NO GROWTH 5 DAYS  Final   Report Status 12/04/2015 FINAL  Final  Blood Culture (routine x 2)     Status: None   Collection Time: 11/29/15  9:44 AM  Result Value Ref Range Status   Specimen Description BLOOD RIGHT AC  Final   Special Requests   Final    BOTTLES DRAWN AEROBIC AND ANAEROBIC ANA 4ML AER 2ML   Culture NO GROWTH 5 DAYS  Final   Report Status 12/04/2015 FINAL  Final  Urine culture     Status: Abnormal   Collection Time: 11/29/15 10:00 AM  Result Value Ref Range Status   Specimen Description URINE, RANDOM  Final   Special Requests NONE  Final   Culture 80,000 COLONIES/mL ESCHERICHIA COLI (A)  Final   Report Status 12/01/2015 FINAL  Final   Organism ID, Bacteria ESCHERICHIA COLI (A)  Final       Susceptibility   Escherichia coli - MIC*    AMPICILLIN <=2 SENSITIVE Sensitive     CEFAZOLIN <=4 SENSITIVE Sensitive     CEFTRIAXONE <=1 SENSITIVE Sensitive     CIPROFLOXACIN <=0.25 SENSITIVE Sensitive     GENTAMICIN <=1 SENSITIVE Sensitive  IMIPENEM <=0.25 SENSITIVE Sensitive     NITROFURANTOIN <=16 SENSITIVE Sensitive     TRIMETH/SULFA <=20 SENSITIVE Sensitive     AMPICILLIN/SULBACTAM <=2 SENSITIVE Sensitive     PIP/TAZO <=4 SENSITIVE Sensitive     Extended ESBL NEGATIVE Sensitive     * 80,000 COLONIES/mL ESCHERICHIA COLI    RADIOLOGY:  No results found.      Management plans discussed with the patient, family and they are in agreement.  CODE STATUS:     Code Status Orders        Start     Ordered   11/30/15 1231  Do not attempt resuscitation (DNR)   Continuous    Question Answer Comment  In the event of cardiac or respiratory ARREST Do not call a "code blue"   In the event of cardiac or respiratory ARREST Do not perform Intubation, CPR, defibrillation or ACLS   In the event of cardiac or respiratory ARREST Use medication by any route, position, wound care, and other measures to relive pain and suffering. May use oxygen, suction and manual treatment of airway obstruction as needed for comfort.      11/30/15 1238    Code Status History    Date Active Date Inactive Code Status Order ID Comments User Context   11/29/2015  5:28 PM 11/30/2015 12:38 PM Full Code [id number]  Fritzi Mandes, MD Inpatient   11/30/2011  7:16 PM 12/02/2011  2:37 PM Full Code [id number]  Isabel Caprice, RN Inpatient      TOTAL TIME TAKING CARE OF THIS PATIENT: 37 minutes.    Demetrios Loll M.D on 12/06/2015 at 11:38 AM  Between 7am to 6pm - Pager - [phone]  After 6pm go to [email or web address] - password [email or web address] [facility]  Office  [phone]  CC: Primary care physician; Madelyn Brunner, MD

## 2015-12-06 NOTE — Clinical Social Work Note (Signed)
Patient to discharge today to [facility] as patient changed her decision on facility. Beth at [facility] has provided auth: [id number] for patient to go to [facility] today. Discharge information sent. CSW has spoken to [place], Scientist, physiological at [facility] and he is aware of patient coming and is able to offer her a private room at no extra cost. Patient states that her husband is going to take her. Shela Leff MSW,LCSW [phone]

## 2015-12-06 NOTE — Progress Notes (Addendum)
Report called to [place] at [facility]. No pain. IV and tele removed. Husband to take to facility. Pt has no further concerns at this time.

## 2015-12-06 NOTE — Care Management Important Message (Signed)
Important Message  Patient Details  Name: Patricia Ewing MRN: [id number] Date of Birth: 1950/09/29   Medicare Important Message Given:  Yes    Abbi Mancini A, RN 12/06/2015, 7:17 AM

## 2015-12-06 NOTE — Clinical Social Work Placement (Signed)
   CLINICAL SOCIAL WORK PLACEMENT  NOTE  Date:  12/06/2015  Patient Details  Name: Patricia Ewing MRN: [id number] Date of Birth: 07-10-1951  Clinical Social Work is seeking post-discharge placement for this patient at the [place] level of care (*CSW will initial, date and re-position this form in  chart as items are completed):  Yes   Patient/family provided with [place] Work Department's list of facilities offering this level of care within the geographic area requested by the patient (or if unable, by the patient's family).  Yes   Patient/family informed of their freedom to choose among providers that offer the needed level of care, that participate in Medicare, Medicaid or managed care program needed by the patient, have an available bed and are willing to accept the patient.  Yes   Patient/family informed of [facility]'s ownership interest in [facility] and [facility], as well as of the fact that they are under no obligation to receive care at these facilities.  PASRR submitted to EDS on 12/04/15     PASRR number received on 12/04/15     Existing PASRR number confirmed on       FL2 transmitted to all facilities in geographic area requested by pt/family on 12/04/15     FL2 transmitted to all facilities within larger geographic area on       Patient informed that his/her managed care company has contracts with or will negotiate with certain facilities, including the following:        Yes   Patient/family informed of bed offers received.  Patient chooses bed at  [facility])     Physician recommends and patient chooses bed at  [facility])    Patient to be transferred to  [facility]) on 12/06/15.  Patient to be transferred to facility by  (Patient's huband)     Patient family notified on 12/06/15 of transfer.  Name of family member notified:        PHYSICIAN Please sign FL2     Additional  Comment:    _______________________________________________ Shela Leff, LCSW 12/06/2015, 2:42 PM

## 2015-12-06 NOTE — Progress Notes (Signed)
SUBJECTIVE: Patient still having leg pain and is to get physical therapy today   Filed Vitals:   12/06/15 0401 12/06/15 0524 12/06/15 0722 12/06/15 0743  BP: 190/88 164/76  184/88  Pulse: 58 55  58  Temp: 98.4 F (36.9 C)     TempSrc: Oral     Resp: 18   20  Height:      Weight:      SpO2: 98%  97% 95%    Intake/Output Summary (Last 24 hours) at 12/06/15 0839 Last data filed at 12/06/15 0536  Gross per 24 hour  Intake    540 ml  Output    626 ml  Net    -86 ml    LABS: Basic Metabolic Panel:  Recent Labs  12/04/15 1224 12/05/15 0533 12/06/15 0601  NA 139 139 138  K 4.1 4.1 4.1  CL 107 106 105  CO2 25 26 24   GLUCOSE 130* 130* 115*  BUN 33* 27* 19  CREATININE 1.21* 1.23* 0.98  CALCIUM 8.1* 8.4* 8.0*  MG 2.2  --   --   PHOS 2.3* 3.0 3.0   Liver Function Tests: No results for input(s): AST, ALT, ALKPHOS, BILITOT, PROT, ALBUMIN in the last 72 hours. No results for input(s): LIPASE, AMYLASE in the last 72 hours. CBC:  Recent Labs  12/05/15 0533 12/06/15 0601  WBC 14.8* 15.3*  HGB 10.7* 9.8*  HCT 32.8* 28.8*  MCV 90.1 89.5  PLT 210 209   Cardiac Enzymes: No results for input(s): CKTOTAL, CKMB, CKMBINDEX, TROPONINI in the last 72 hours. BNP: Invalid input(s): POCBNP D-Dimer: No results for input(s): DDIMER in the last 72 hours. Hemoglobin A1C: No results for input(s): HGBA1C in the last 72 hours. Fasting Lipid Panel: No results for input(s): CHOL, HDL, LDLCALC, TRIG, CHOLHDL, LDLDIRECT in the last 72 hours. Thyroid Function Tests: No results for input(s): TSH, T4TOTAL, T3FREE, THYROIDAB in the last 72 hours.  Invalid input(s): FREET3 Anemia Panel: No results for input(s): VITAMINB12, FOLATE, FERRITIN, TIBC, IRON, RETICCTPCT in the last 72 hours.   PHYSICAL EXAM General: Well developed, well nourished, in no acute distress HEENT:  Normocephalic and atramatic Neck:  No JVD.  Lungs: Clear bilaterally to auscultation and percussion. Heart: HRRR .  Normal S1 and S2 without gallops or murmurs.  Abdomen: Bowel sounds are positive, abdomen soft and non-tender  Msk:  Back normal, normal gait. Normal strength and tone for age. Extremities: No clubbing, cyanosis or edema.   Neuro: Alert and oriented X 3. Psych:  Good affect, responds appropriately  TELEMETRY:Sinus rhythm  ASSESSMENT AND PLAN: Atrial fibrillation/non-STEMI/sepsis and cellulitis of the lower extremity. Getting physical therapy prior to discharge.  Active Problems:   Sepsis ([place])    Dionisio David, MD, [facility] 12/06/2015 8:39 AM

## 2017-01-05 ENCOUNTER — Encounter (INDEPENDENT_AMBULATORY_CARE_PROVIDER_SITE_OTHER): Payer: Medicare Other

## 2017-01-05 ENCOUNTER — Ambulatory Visit (INDEPENDENT_AMBULATORY_CARE_PROVIDER_SITE_OTHER): Payer: Self-pay | Admitting: Vascular Surgery

## 2018-04-16 ENCOUNTER — Other Ambulatory Visit: Payer: Self-pay | Admitting: Internal Medicine

## 2018-04-16 DIAGNOSIS — Z1231 Encounter for screening mammogram for malignant neoplasm of breast: Secondary | ICD-10-CM

## 2018-04-23 ENCOUNTER — Ambulatory Visit
Admission: RE | Admit: 2018-04-23 | Discharge: 2018-04-23 | Disposition: A | Discharge status: Home / Self Care | Payer: Medicare Other | Source: Ambulatory Visit | Attending: Internal Medicine | Admitting: Internal Medicine

## 2018-04-23 DIAGNOSIS — Z1231 Encounter for screening mammogram for malignant neoplasm of breast: Secondary | ICD-10-CM | POA: Diagnosis present

## 2018-04-23 IMAGING — MG MM DIGITAL SCREENING BILAT W/ TOMO W/ CAD
6 of 10 series · 6 of 30 positions shown · non-contrast
Comparison: Previous exam(s).

ACR Breast Density Category a: The breast tissue is almost entirely
fatty.

CLINICAL DATA: Screening.

EXAM:
DIGITAL SCREENING BILATERAL MAMMOGRAM WITH TOMO AND CAD

[R CC synth-2D]
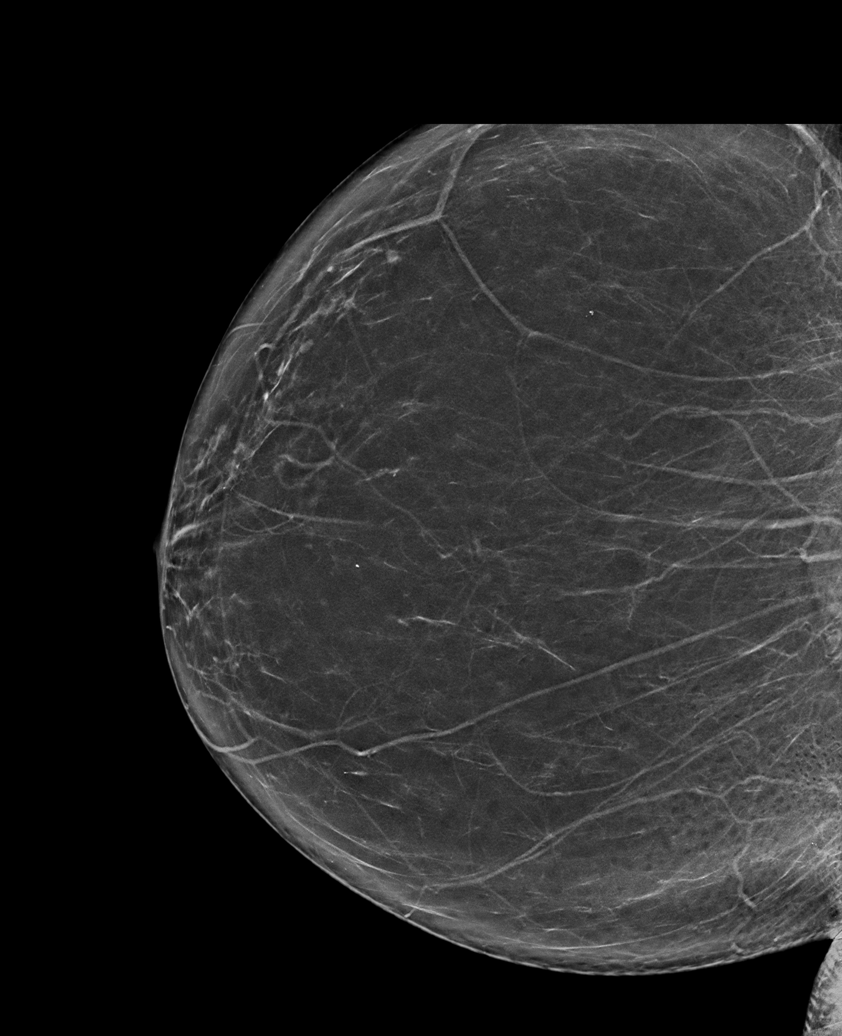

[R MLO synth-2D (1 of 2)]
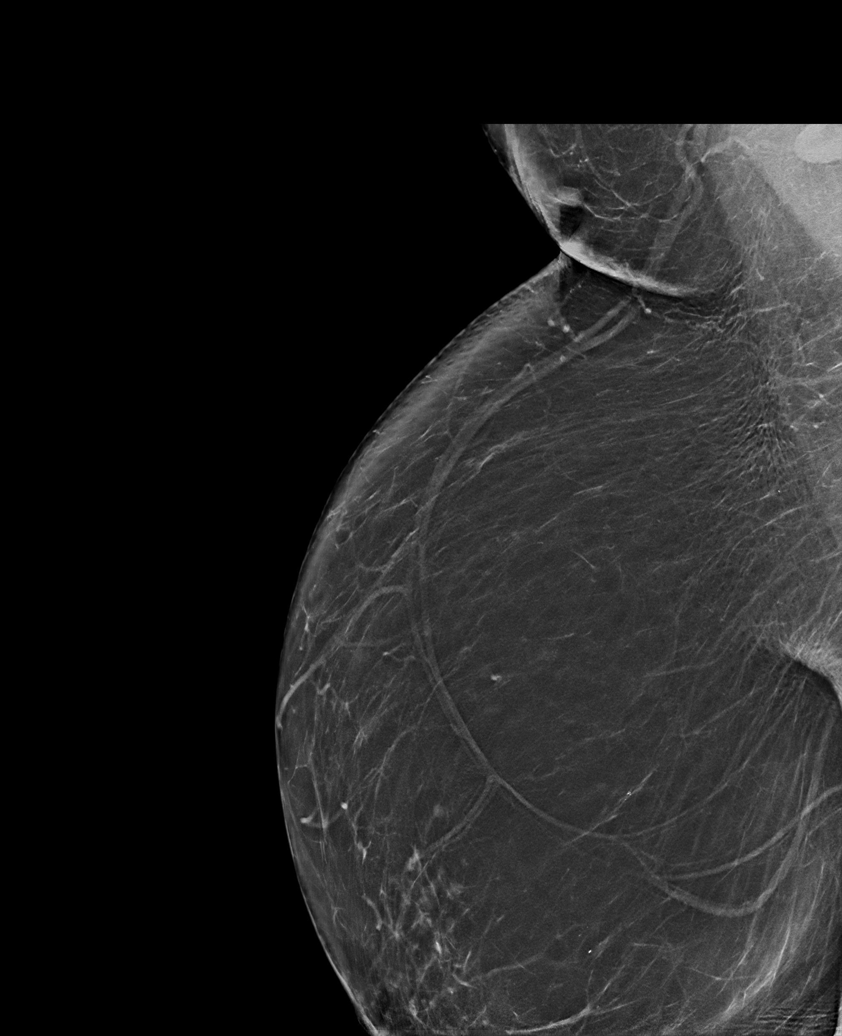

[R MLO synth-2D (2 of 2)]
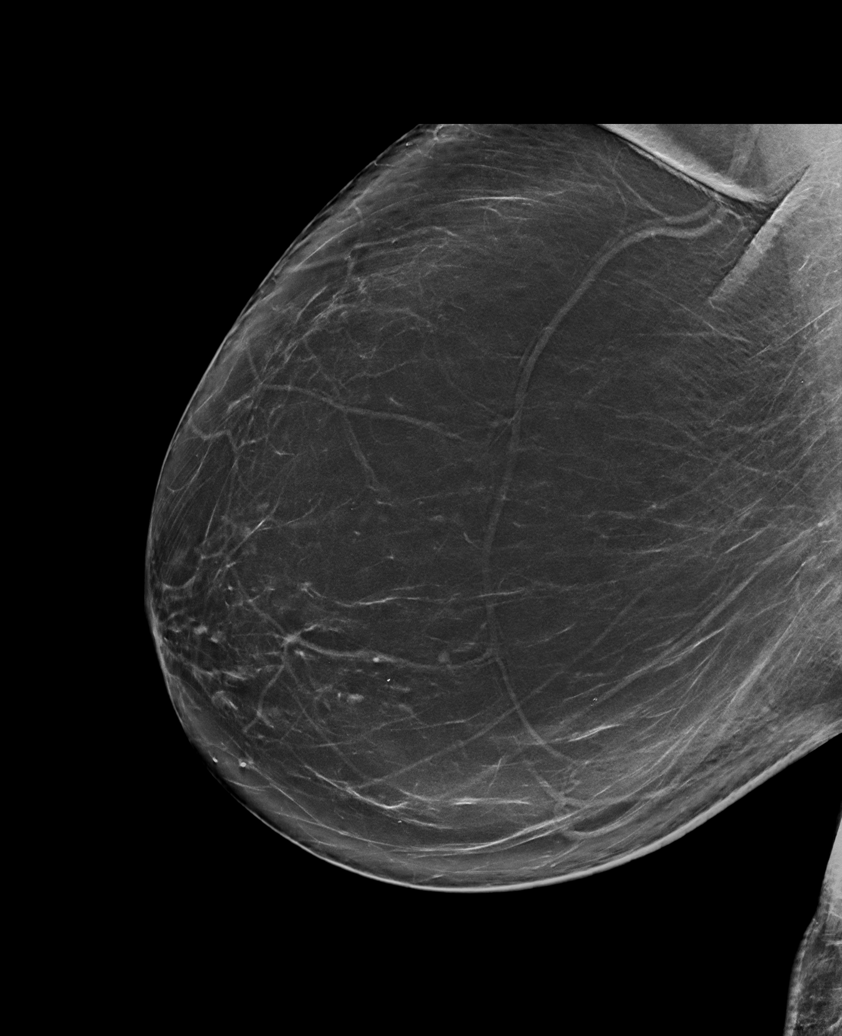

[L CC synth-2D]
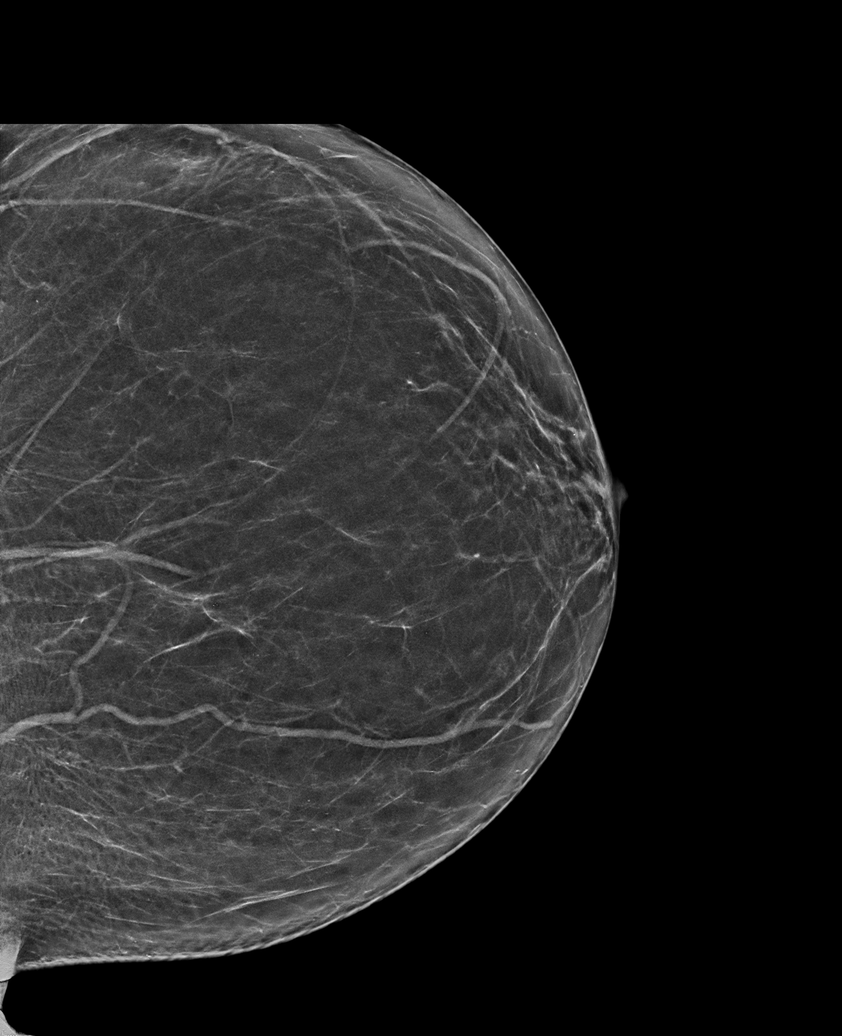

[L MLO synth-2D]
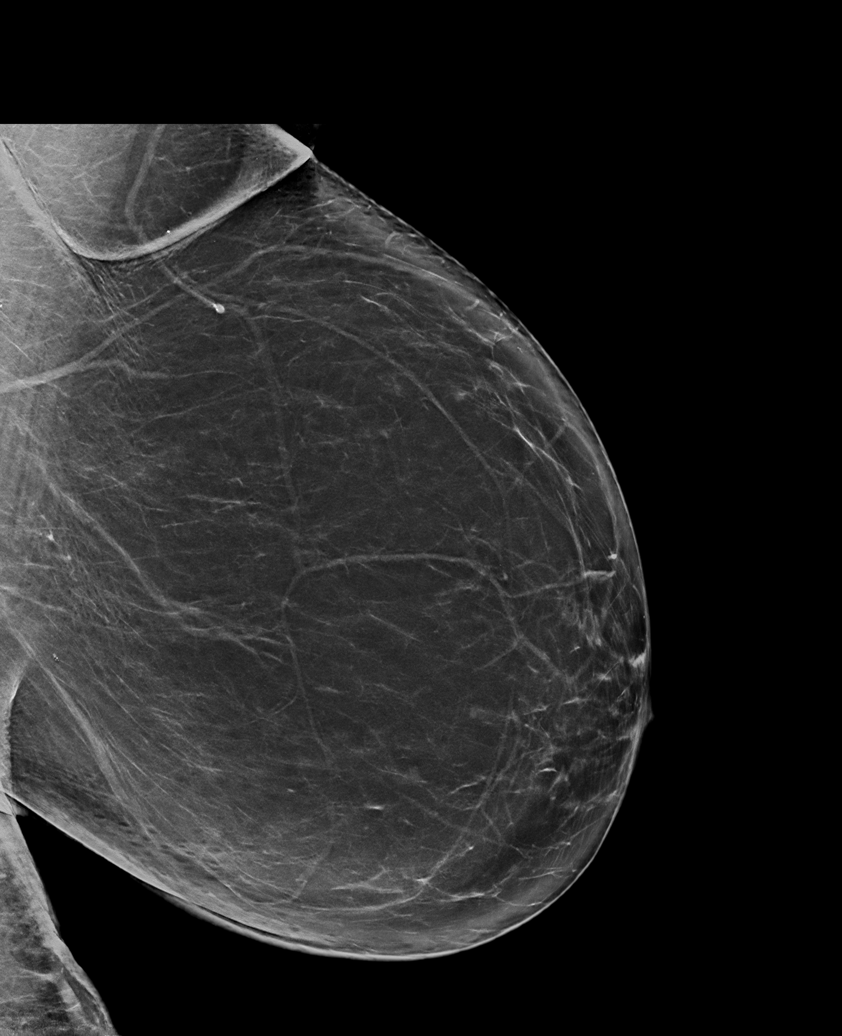

[L CC tomo · tomo slice 35/69.0]
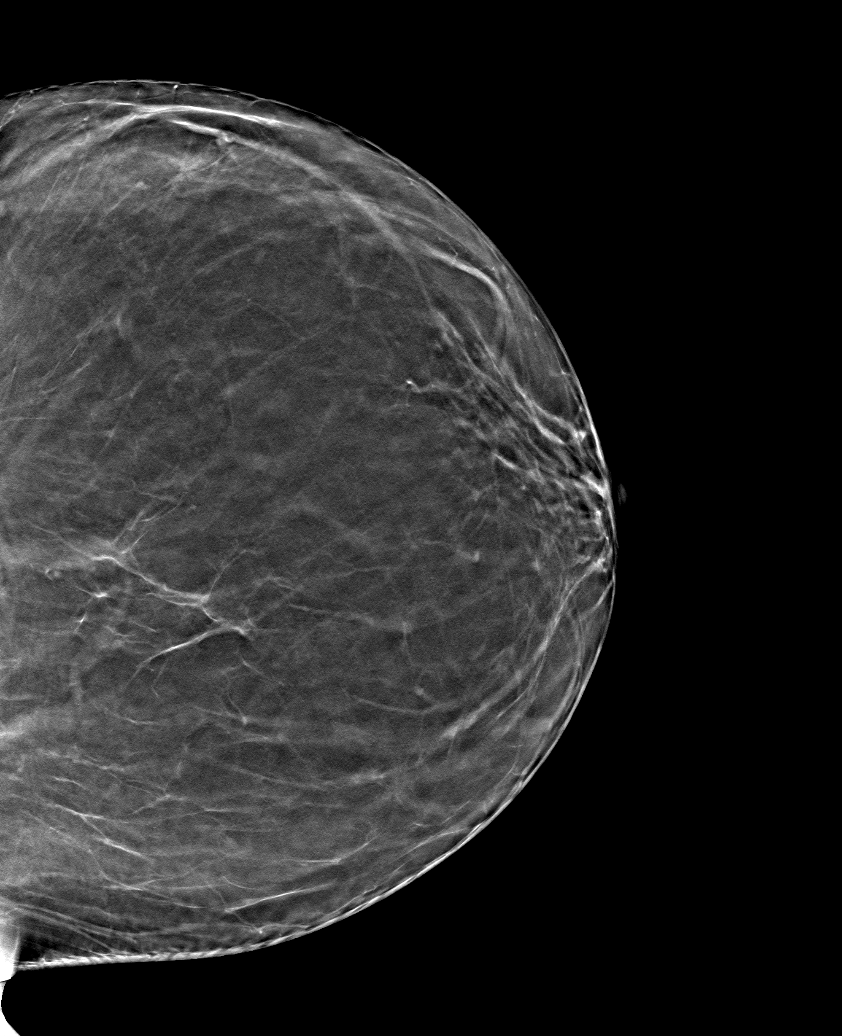

[6 of 30 positions shown; findings below may reference images not displayed]

FINDINGS: There are no findings suspicious for malignancy. Images were
processed with CAD.
IMPRESSION: No mammographic evidence of malignancy. A result letter of this
screening mammogram will be mailed directly to the patient.

RECOMMENDATION:
Screening mammogram in one year. (Code:[TA])

BI-RADS CATEGORY  1: Negative.

## 2018-09-10 ENCOUNTER — Encounter: Payer: Self-pay | Admitting: *Deleted

## 2018-09-11 ENCOUNTER — Encounter: Payer: Self-pay | Admitting: Anesthesiology

## 2018-09-11 ENCOUNTER — Ambulatory Visit: Payer: Medicare Other | Admitting: Anesthesiology

## 2018-09-11 ENCOUNTER — Ambulatory Visit
Admission: RE | Admit: 2018-09-11 | Discharge: 2018-09-11 | Disposition: A | Discharge status: Home / Self Care | Payer: Medicare Other | Attending: Unknown Physician Specialty | Admitting: Unknown Physician Specialty

## 2018-09-11 ENCOUNTER — Encounter
Admission: RE | Disposition: A | Discharge status: Home / Self Care | Payer: Self-pay | Source: Home / Self Care | Attending: Unknown Physician Specialty

## 2018-09-11 DIAGNOSIS — I4891 Unspecified atrial fibrillation: Secondary | ICD-10-CM | POA: Insufficient documentation

## 2018-09-11 DIAGNOSIS — E785 Hyperlipidemia, unspecified: Secondary | ICD-10-CM | POA: Diagnosis not present

## 2018-09-11 DIAGNOSIS — D123 Benign neoplasm of transverse colon: Secondary | ICD-10-CM | POA: Diagnosis not present

## 2018-09-11 DIAGNOSIS — E119 Type 2 diabetes mellitus without complications: Secondary | ICD-10-CM | POA: Diagnosis not present

## 2018-09-11 DIAGNOSIS — Z79899 Other long term (current) drug therapy: Secondary | ICD-10-CM | POA: Insufficient documentation

## 2018-09-11 DIAGNOSIS — F17210 Nicotine dependence, cigarettes, uncomplicated: Secondary | ICD-10-CM | POA: Diagnosis not present

## 2018-09-11 DIAGNOSIS — K640 First degree hemorrhoids: Secondary | ICD-10-CM | POA: Insufficient documentation

## 2018-09-11 DIAGNOSIS — I10 Essential (primary) hypertension: Secondary | ICD-10-CM | POA: Insufficient documentation

## 2018-09-11 DIAGNOSIS — Z7982 Long term (current) use of aspirin: Secondary | ICD-10-CM | POA: Diagnosis not present

## 2018-09-11 DIAGNOSIS — H409 Unspecified glaucoma: Secondary | ICD-10-CM | POA: Insufficient documentation

## 2018-09-11 DIAGNOSIS — I252 Old myocardial infarction: Secondary | ICD-10-CM | POA: Insufficient documentation

## 2018-09-11 DIAGNOSIS — Z1211 Encounter for screening for malignant neoplasm of colon: Secondary | ICD-10-CM | POA: Insufficient documentation

## 2018-09-11 HISTORY — PX: COLONOSCOPY WITH PROPOFOL: SHX5780

## 2018-09-11 HISTORY — DX: Unspecified atrial fibrillation: I48.91

## 2018-09-11 HISTORY — DX: Non-ST elevation (NSTEMI) myocardial infarction: I21.4

## 2018-09-11 SURGERY — COLONOSCOPY WITH PROPOFOL
Anesthesia: General

## 2018-09-11 MED ORDER — SODIUM CHLORIDE 0.9 % IV SOLN
INTRAVENOUS | Status: DC
  Administered 2018-09-11: 1000 mL via INTRAVENOUS

## 2018-09-11 MED ORDER — PROPOFOL 500 MG/50ML IV EMUL
INTRAVENOUS | Status: DC | PRN
  Administered 2018-09-11: 160 ug/kg/min via INTRAVENOUS

## 2018-09-11 MED ORDER — LIDOCAINE HCL (PF) 2 % IJ SOLN
INTRAMUSCULAR | Status: AC
Start: 2018-09-11 — End: ?
  Filled 2018-09-11: qty 10

## 2018-09-11 MED ORDER — PROPOFOL 500 MG/50ML IV EMUL
INTRAVENOUS | Status: AC
  Filled 2018-09-11: qty 50

## 2018-09-11 MED ORDER — SODIUM CHLORIDE 0.9 % IV SOLN: INTRAVENOUS | Status: DC

## 2018-09-11 MED ORDER — PROPOFOL 10 MG/ML IV BOLUS
INTRAVENOUS | Status: DC | PRN
  Administered 2018-09-11: 100 mg via INTRAVENOUS

## 2018-09-11 MED ORDER — FENTANYL CITRATE (PF) 100 MCG/2ML IJ SOLN
INTRAMUSCULAR | Status: DC | PRN
  Administered 2018-09-11 (×2): 50 ug via INTRAVENOUS

## 2018-09-11 MED ORDER — LIDOCAINE 2% (20 MG/ML) 5 ML SYRINGE
INTRAMUSCULAR | Status: DC | PRN
  Administered 2018-09-11: 30 mg via INTRAVENOUS

## 2018-09-11 MED ORDER — FENTANYL CITRATE (PF) 100 MCG/2ML IJ SOLN
INTRAMUSCULAR | Status: AC
Start: 2018-09-11 — End: ?
  Filled 2018-09-11: qty 2

## 2018-09-11 NOTE — Anesthesia Preprocedure Evaluation (Addendum)
Anesthesia Evaluation  Patient identified by MRN, date of birth, ID band Patient awake    Reviewed: Allergy & Precautions, NPO status , Patient's Chart, lab work & pertinent test results, reviewed documented beta blocker date and time   Airway Mallampati: II  TM Distance: >3 FB     Dental  (+) Chipped   Pulmonary Current Smoker,           Cardiovascular hypertension, Pt. on medications and Pt. on home beta blockers + Past MI  + dysrhythmias Atrial Fibrillation      Neuro/Psych    GI/Hepatic   Endo/Other  diabetes, Type 2  Renal/GU      Musculoskeletal   Abdominal   Peds  Hematology   Anesthesia Other Findings Smokes. EF 50 2 y ago. Pt denies having an MI. Has had AFib many yrs ago.  Reproductive/Obstetrics                            Anesthesia Physical Anesthesia Plan  ASA: III  Anesthesia Plan: General   Post-op Pain Management:    Induction: Intravenous  PONV Risk Score and Plan:   Airway Management Planned:   Additional Equipment:   Intra-op Plan:   Post-operative Plan:   Informed Consent: I have reviewed the patients History and Physical, chart, labs and discussed the procedure including the risks, benefits and alternatives for the proposed anesthesia with the patient or authorized representative who has indicated his/her understanding and acceptance.       Plan Discussed with: CRNA  Anesthesia Plan Comments:         Anesthesia Quick Evaluation

## 2018-09-11 NOTE — Anesthesia Postprocedure Evaluation (Signed)
Anesthesia Post Note  Patient: Patricia Ewing  Procedure(s) Performed: COLONOSCOPY WITH PROPOFOL (N/A )  Patient location during evaluation: Endoscopy Anesthesia Type: General Level of consciousness: awake and alert Pain management: pain level controlled Vital Signs Assessment: post-procedure vital signs reviewed and stable Respiratory status: spontaneous breathing, nonlabored ventilation, respiratory function stable and patient connected to nasal cannula oxygen Cardiovascular status: blood pressure returned to baseline and stable Postop Assessment: no apparent nausea or vomiting Anesthetic complications: no     Last Vitals:  Vitals:   09/11/18 1130 09/11/18 1140  BP: 109/70 118/77  Pulse: 67 66  Resp: 13 12  Temp: (!) 36.1 C   SpO2: 100% 100%    Last Pain:  Vitals:   09/11/18 1120  TempSrc: Tympanic  PainSc:                  [place] Fanara S

## 2018-09-11 NOTE — H&P (Signed)
Primary Care Physician:  Baxter Hire, MD Primary Gastroenterologist:  Dr. Vira Agar  Pre-Procedure History & Physical: HPI:  Patricia Ewing is a 68 y.o. female is here for an colonoscopy.   Past Medical History:  Diagnosis Date  . A-fib ([place])   . Diabetes mellitus    diagnosed at age 17, on a "pill" (?metformin), but now diet-controlled  . Glaucoma   . Hyperlipidemia    managed with lifestyle modification  . Hypertension   . NSTEMI (non-ST elevated myocardial infarction) ([place])   . Tobacco abuse    No PFT's or diagnosis of COPD    Past Surgical History:  Procedure Laterality Date  . ABDOMINAL HYSTERECTOMY  1984  . BREAST CYST EXCISION     abcess drainage  . COLONOSCOPY WITH PROPOFOL      Prior to Admission medications   Medication Sig Start Date End Date Taking? Authorizing Provider  albuterol (PROVENTIL HFA;VENTOLIN HFA) 108 (90 BASE) MCG/ACT inhaler Inhale 1 puff into the lungs every 6 (six) hours as needed. Shortness of breath   Yes [provider]  aspirin 81 MG tablet Take 81 mg by mouth daily.   Yes [provider]  atorvastatin (LIPITOR) 40 MG tablet Take 1 tablet (40 mg total) by mouth daily at 6 PM. 12/06/15  Yes Demetrios Loll, MD  brimonidine [facility]) 0.2 % ophthalmic solution Place 1 drop into both eyes 2 (two) times daily.   Yes [provider]  cetirizine (ZYRTEC) 10 MG tablet Take 10 mg by mouth daily as needed. allergies   Yes [provider]  cloNIDine (CATAPRES) 0.2 MG tablet Take 0.2 mg by mouth daily.   Yes [provider]  furosemide (LASIX) 40 MG tablet Take 40 mg by mouth 2 (two) times daily.   Yes [provider]  latanoprost (XALATAN) 0.005 % ophthalmic solution Place 1 drop into both eyes.   Yes [provider]  metoprolol (LOPRESSOR) 50 MG tablet Take 1 tablet (50 mg total) by mouth 2 (two) times daily. 12/06/15  Yes Demetrios Loll, MD  nitroGLYCERIN (NITROSTAT) 0.4 MG SL tablet Place 1  tablet (0.4 mg total) under the tongue every 5 (five) minutes as needed for chest pain. 12/06/15  Yes Demetrios Loll, MD  polyethylene glycol [facility]) packet Take 17 g by mouth daily. 12/06/15  Yes Demetrios Loll, MD  potassium chloride SA (K-DUR,KLOR-CON) 20 MEQ tablet Take 20 mEq by mouth 2 (two) times daily.   Yes [provider]    Allergies as of 08/05/2018 - Review Complete 11/29/2015  Allergen Reaction Noted  . Contrast media [iodinated diagnostic agents] Other (See Comments) 11/30/2011    Family History  Problem Relation Age of Onset  . Heart attack Father 56  . Lymphoma Son   . Colon cancer Other   . Breast cancer Neg Hx     Social History   Socioeconomic History  . Marital status: Married    Spouse name: Not on file  . Number of children: Not on file  . Years of education: Not on file  . Highest education level: Not on file  Occupational History  . Not on file  Social Needs  . Financial resource strain: Not on file  . Food insecurity:    Worry: Not on file    Inability: Not on file  . Transportation needs:    Medical: Not on file    Non-medical: Not on file  Tobacco Use  . Smoking status: Current Every  Day Smoker    Packs/day: 0.50    Years: 40.00    Pack years: 20.00    Types: Cigarettes  . Smokeless tobacco: Never Used  Substance and Sexual Activity  . Alcohol use: Yes    Alcohol/week: 1.0 - 2.0 standard drinks    Types: 1 - 2 Standard drinks or equivalent per week  . Drug use: No  . Sexual activity: Not on file  Lifestyle  . Physical activity:    Days per week: Not on file    Minutes per session: Not on file  . Stress: Not on file  Relationships  . Social connections:    Talks on phone: Not on file    Gets together: Not on file    Attends religious service: Not on file    Active member of club or organization: Not on file    Attends meetings of clubs or organizations: Not on file    Relationship status: Not on file  . Intimate  partner violence:    Fear of current or ex partner: Not on file    Emotionally abused: Not on file    Physically abused: Not on file    Forced sexual activity: Not on file  Other Topics Concern  . 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    Review of Systems: See HPI, otherwise negative ROS  Physical Exam: BP (!) 186/82   Pulse 65   Temp (!) 95.3 F (35.2 C) (Tympanic)   Resp 18   Ht 5\' 5"  (1.651 m)   Wt 105.7 kg   SpO2 100%   BMI 38.77 kg/m  General:   Alert,  pleasant and cooperative in NAD Head:  Normocephalic and atraumatic. Neck:  Supple; no masses or thyromegaly. Lungs:  Clear throughout to auscultation.    Heart:  Regular rate and rhythm. Abdomen:  Soft, nontender and nondistended. Normal bowel sounds, without guarding, and without rebound.   Neurologic:  Alert and  oriented x4;  grossly normal neurologically.  Impression/Plan: Patricia Ewing is here for an colonoscopy to be performed for screening  Risks, benefits, limitations, and alternatives regarding  colonoscopy have been reviewed with the patient.  Questions have been answered.  All parties agreeable.   Gaylyn Cheers, MD  09/11/2018, 10:46 AM

## 2018-09-11 NOTE — Transfer of Care (Signed)
Immediate Anesthesia Transfer of Care Note  Patient: Patricia Ewing  Procedure(s) Performed: COLONOSCOPY WITH PROPOFOL (N/A )  Patient Location: PACU and Endoscopy Unit  Anesthesia Type:General  Level of Consciousness: sedated  Airway & Oxygen Therapy: Patient Spontanous Breathing and Patient connected to nasal cannula oxygen  Post-op Assessment: Report given to RN and Post -op Vital signs reviewed and stable  Post vital signs: Reviewed and stable  Last Vitals:  Vitals Value Taken Time  BP    Temp    Pulse 67 09/11/2018 11:29 AM  Resp 13 09/11/2018 11:29 AM  SpO2 100 % 09/11/2018 11:29 AM  Vitals shown include unvalidated device data.  Last Pain:  Vitals:   09/11/18 1120  TempSrc: (P) Tympanic  PainSc:          Complications: No apparent anesthesia complications

## 2018-09-11 NOTE — Anesthesia Post-op Follow-up Note (Signed)
Anesthesia QCDR form completed.        

## 2018-09-11 NOTE — Op Note (Addendum)
[facility] Gastroenterology Patient Name: Patricia Ewing Procedure Date: 09/11/2018 10:41 AM MRN: [id number] Account #: [id number] Date of Birth: [birth year] Admit Type: Outpatient Age: 68 Room: [facility] ENDO ROOM 2 Gender: Female Note Status: Finalized Procedure:            Colonoscopy Indications:          Screening for colorectal malignant neoplasm Providers:            Manya Silvas, MD Referring MD:         Baxter Hire, MD (Referring MD) Medicines:            Propofol per Anesthesia Complications:        No immediate complications. Procedure:            Pre-Anesthesia Assessment:                       - After reviewing the risks and benefits, the patient                        was deemed in satisfactory condition to undergo the                        procedure.                       After obtaining informed consent, the colonoscope was                        passed under direct vision. Throughout the procedure,                        the patient's blood pressure, pulse, and oxygen                        saturations were monitored continuously. The was                        introduced through the anus and advanced to the the                        cecum, identified by appendiceal orifice and ileocecal                        valve. The colonoscopy was somewhat difficult due to a                        tortuous colon. The patient tolerated the procedure                        well. The quality of the bowel preparation was good. Findings:      A small polyp was found in the hepatic flexure. The polyp was sessile.       The polyp was removed with a hot snare. Resection and retrieval were       complete. To close a defect after polypectomy, one hemostatic clip was       successfully placed. There was no bleeding during, or at the end, of the       procedure.      Internal hemorrhoids were found during endoscopy. The hemorrhoids were       small and  Grade I  (internal hemorrhoids that do not prolapse).      The exam was otherwise without abnormality. Impression:           - One small polyp at the hepatic flexure, removed with                        a hot snare. Resected and retrieved. Clip was placed.                       - Internal hemorrhoids.                       - The examination was otherwise normal. Recommendation:       - Await pathology results. Manya Silvas, MD 09/11/2018 11:25:27 AM This report has been signed electronically. Number of Addenda: 0 Note Initiated On: 09/11/2018 10:41 AM Scope Withdrawal Time: 0 hours 11 minutes 57 seconds  Total Procedure Duration: 0 hours 25 minutes 55 seconds       [facility]

## 2018-09-12 ENCOUNTER — Encounter: Payer: Self-pay | Admitting: Unknown Physician Specialty

## 2018-09-12 LAB — SURGICAL PATHOLOGY

## 2019-06-23 ENCOUNTER — Other Ambulatory Visit: Payer: Self-pay | Admitting: Internal Medicine

## 2019-06-23 DIAGNOSIS — Z1231 Encounter for screening mammogram for malignant neoplasm of breast: Secondary | ICD-10-CM

## 2019-08-13 ENCOUNTER — Other Ambulatory Visit: Payer: Self-pay | Admitting: Otolaryngology

## 2019-08-13 ENCOUNTER — Ambulatory Visit
Admission: RE | Admit: 2019-08-13 | Discharge: 2019-08-13 | Disposition: A | Discharge status: Home / Self Care | Payer: Self-pay | Source: Ambulatory Visit | Attending: Otolaryngology | Admitting: Otolaryngology

## 2019-08-13 DIAGNOSIS — R221 Localized swelling, mass and lump, neck: Secondary | ICD-10-CM

## 2019-08-13 DIAGNOSIS — K118 Other diseases of salivary glands: Secondary | ICD-10-CM

## 2019-08-22 ENCOUNTER — Other Ambulatory Visit: Payer: Self-pay | Admitting: Radiology

## 2019-08-25 ENCOUNTER — Other Ambulatory Visit: Payer: Self-pay

## 2019-08-25 ENCOUNTER — Ambulatory Visit: Admission: RE | Admit: 2019-08-25 | Payer: Medicare Other | Source: Ambulatory Visit

## 2019-08-25 ENCOUNTER — Ambulatory Visit
Admission: RE | Admit: 2019-08-25 | Discharge: 2019-08-25 | Disposition: A | Discharge status: Home / Self Care | Payer: Medicare Other | Source: Ambulatory Visit | Attending: Otolaryngology | Admitting: Otolaryngology

## 2019-08-25 DIAGNOSIS — F17210 Nicotine dependence, cigarettes, uncomplicated: Secondary | ICD-10-CM | POA: Diagnosis not present

## 2019-08-25 DIAGNOSIS — Z8249 Family history of ischemic heart disease and other diseases of the circulatory system: Secondary | ICD-10-CM | POA: Insufficient documentation

## 2019-08-25 DIAGNOSIS — D110 Benign neoplasm of parotid gland: Secondary | ICD-10-CM | POA: Insufficient documentation

## 2019-08-25 DIAGNOSIS — Z79899 Other long term (current) drug therapy: Secondary | ICD-10-CM | POA: Diagnosis not present

## 2019-08-25 DIAGNOSIS — E785 Hyperlipidemia, unspecified: Secondary | ICD-10-CM | POA: Diagnosis not present

## 2019-08-25 DIAGNOSIS — Z7982 Long term (current) use of aspirin: Secondary | ICD-10-CM | POA: Diagnosis not present

## 2019-08-25 DIAGNOSIS — I4891 Unspecified atrial fibrillation: Secondary | ICD-10-CM | POA: Insufficient documentation

## 2019-08-25 DIAGNOSIS — H409 Unspecified glaucoma: Secondary | ICD-10-CM | POA: Insufficient documentation

## 2019-08-25 DIAGNOSIS — E119 Type 2 diabetes mellitus without complications: Secondary | ICD-10-CM | POA: Insufficient documentation

## 2019-08-25 DIAGNOSIS — I252 Old myocardial infarction: Secondary | ICD-10-CM | POA: Insufficient documentation

## 2019-08-25 DIAGNOSIS — I10 Essential (primary) hypertension: Secondary | ICD-10-CM | POA: Diagnosis not present

## 2019-08-25 DIAGNOSIS — Z881 Allergy status to other antibiotic agents status: Secondary | ICD-10-CM | POA: Insufficient documentation

## 2019-08-25 DIAGNOSIS — K119 Disease of salivary gland, unspecified: Secondary | ICD-10-CM | POA: Diagnosis present

## 2019-08-25 DIAGNOSIS — Z807 Family history of other malignant neoplasms of lymphoid, hematopoietic and related tissues: Secondary | ICD-10-CM | POA: Diagnosis not present

## 2019-08-25 DIAGNOSIS — K118 Other diseases of salivary glands: Secondary | ICD-10-CM

## 2019-08-25 LAB — CBC
HCT: 37.9 % (ref 36.0–46.0)
Hemoglobin: 11.9 g/dL — ABNORMAL LOW (ref 12.0–15.0)
MCH: 30.4 pg (ref 26.0–34.0)
MCHC: 31.4 g/dL (ref 30.0–36.0)
MCV: 96.7 fL (ref 80.0–100.0)
Platelets: 207 10*3/uL (ref 150–400)
RBC: 3.92 MIL/uL (ref 3.87–5.11)
RDW: 13.2 % (ref 11.5–15.5)
WBC: 6.5 10*3/uL (ref 4.0–10.5)
nRBC: 0 % (ref 0.0–0.2)

## 2019-08-25 LAB — PROTIME-INR
INR: 1.1 (ref 0.8–1.2)
Prothrombin Time: 14 seconds (ref 11.4–15.2)

## 2019-08-25 LAB — APTT: aPTT: 26 seconds (ref 24–36)

## 2019-08-25 IMAGING — US IR BIOPSY CORE SALIVARY GLAND
1 series · 13 of 13 positions shown · non-contrast
Comparison: none

CLINICAL DATA: Persistent palpable right parotid mass

[Series 1: ir biopsy core salivary gland · 0.06mm/px · 13 of 13 slices shown]
[im 1/13]
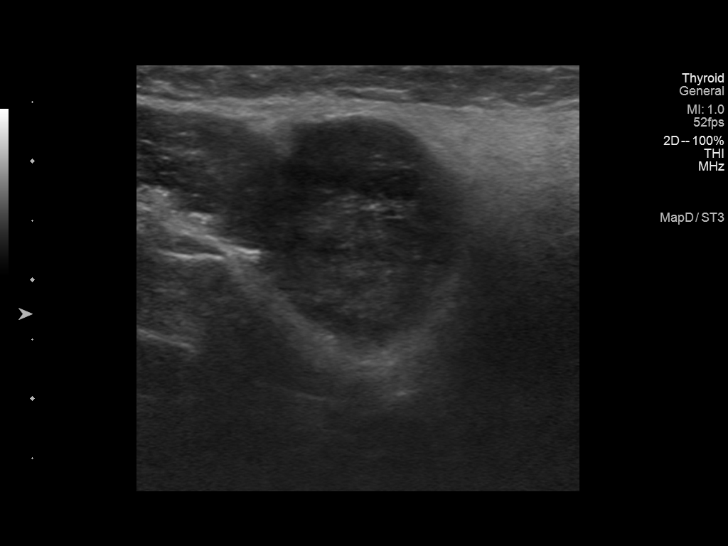
[im 2/13]
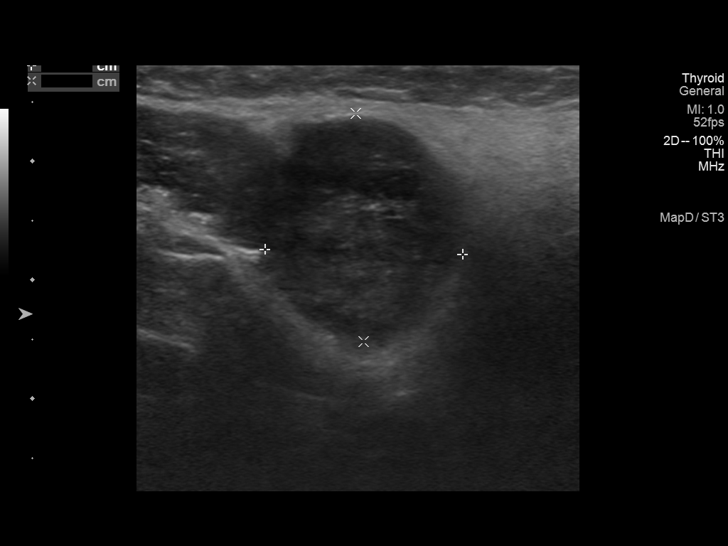
[im 3/13]
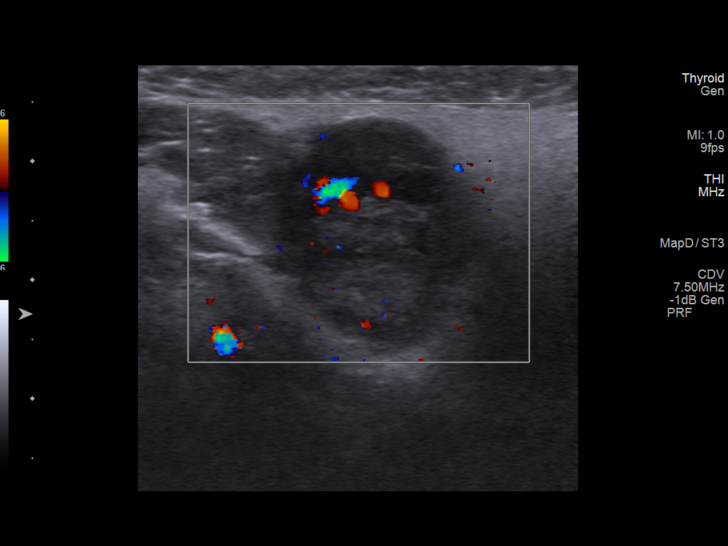
[im 4/13]
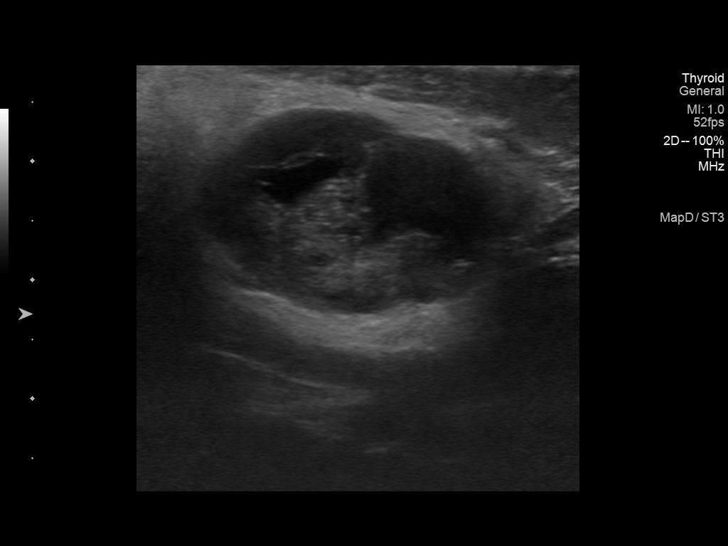
[im 5/13]
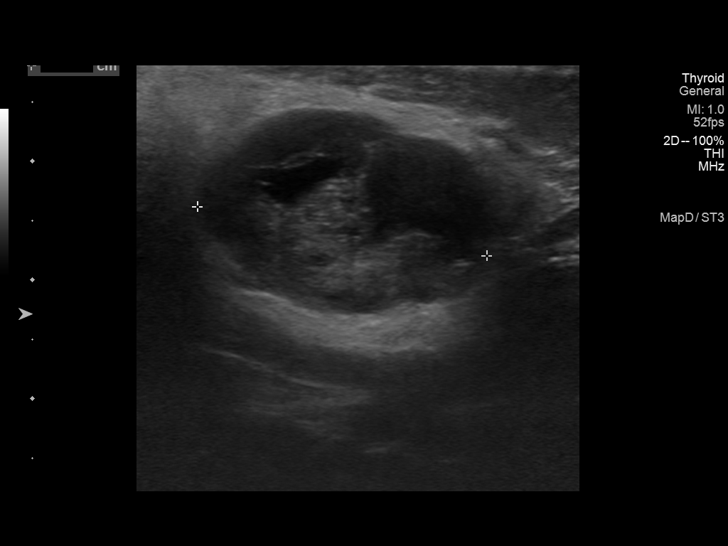
[im 6/13]
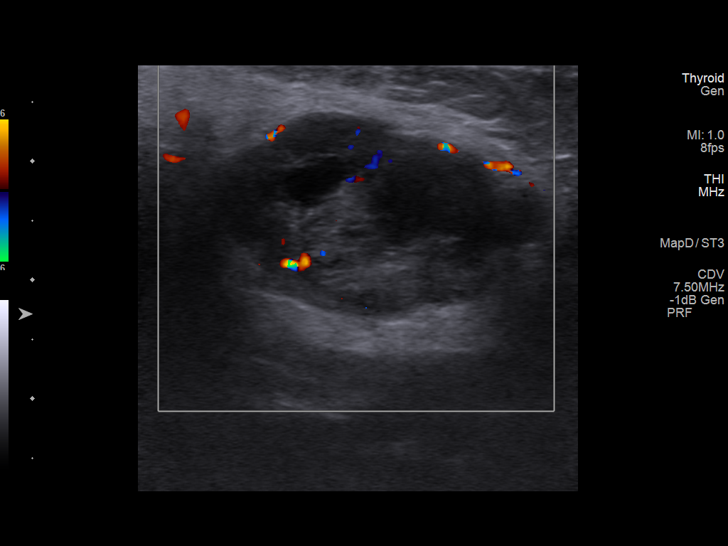
[im 7/13]
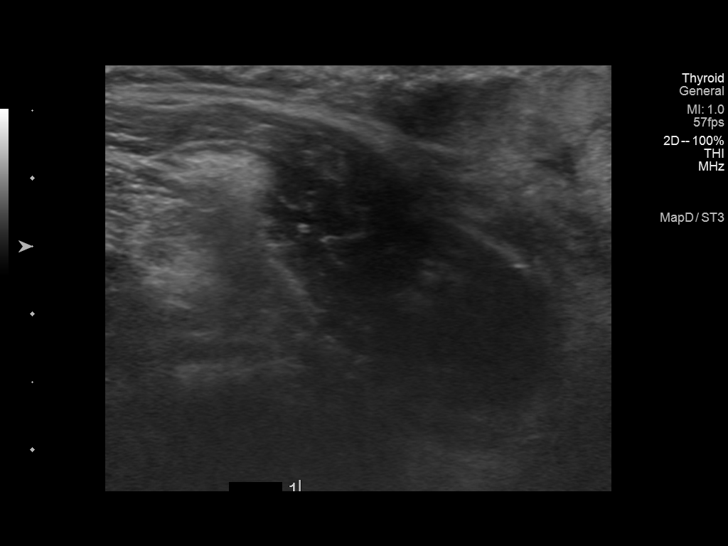
[im 8/13]
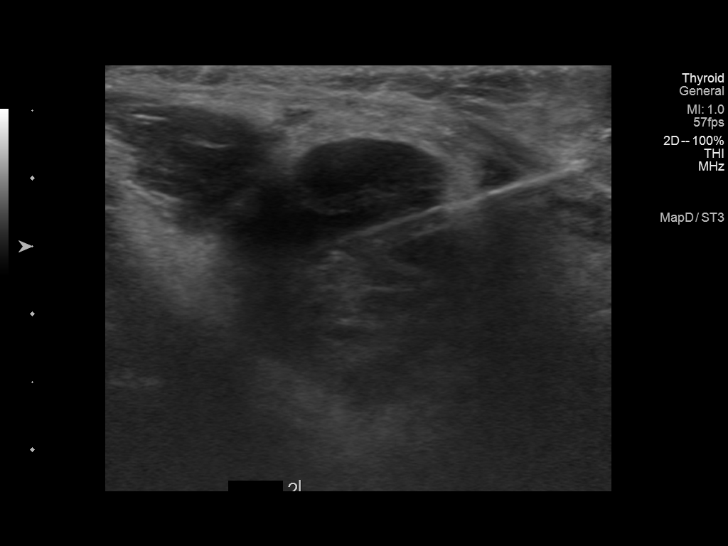
[im 9/13]
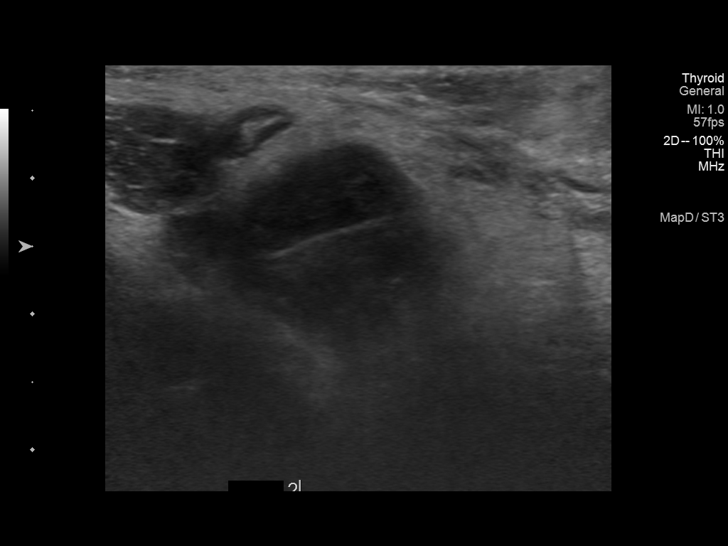
[im 10/13]
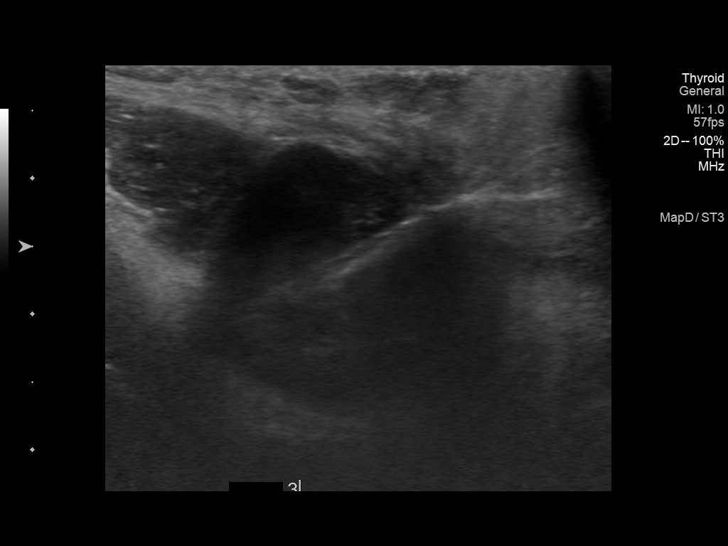
[im 11/13]
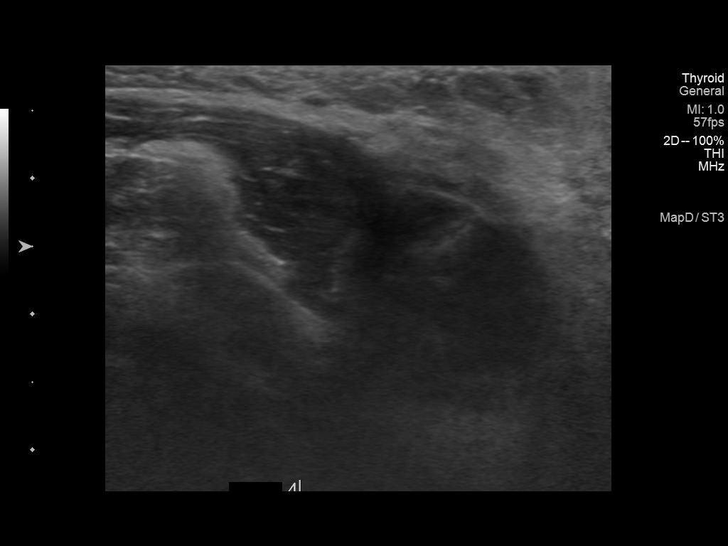
[im 12/13]
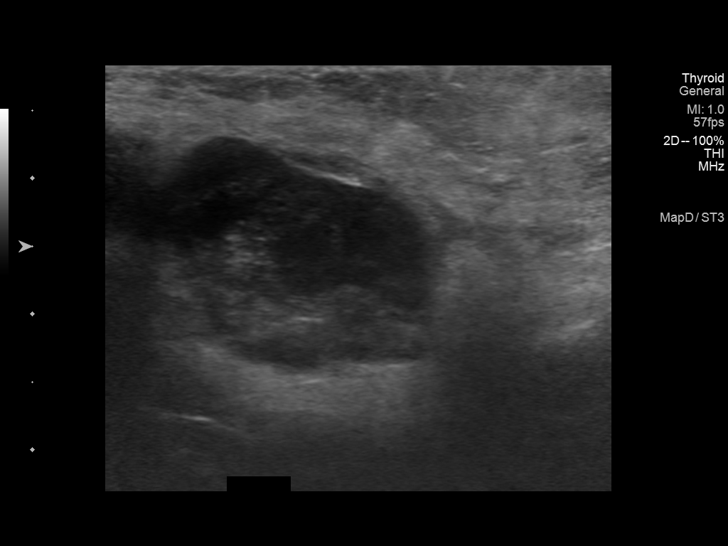
[im 13/13]
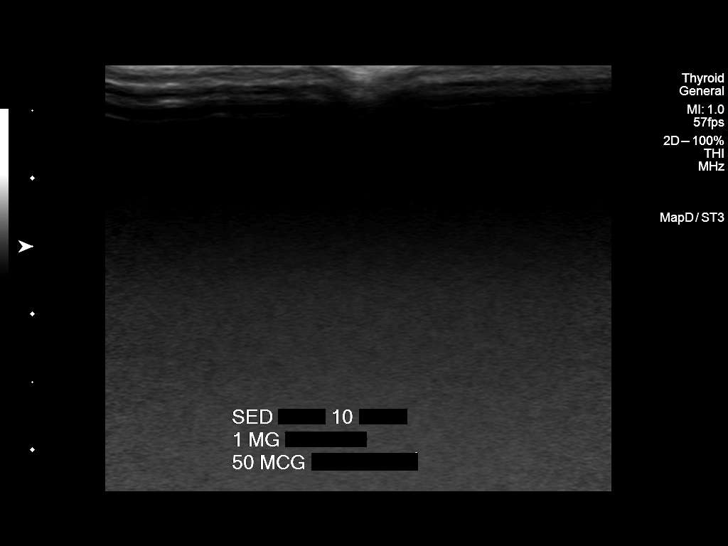

[13 of 13 positions shown; findings below may reference images not displayed]

EXAM:
ULTRASOUND GUIDED CORE BIOPSY OF RIGHT PAROTID MASS

MEDICATIONS:
Intravenous Fentanyl [YG] and Versed 1mg were administered as
conscious sedation during continuous monitoring of the patient's
level of consciousness and physiological / cardiorespiratory status
by the radiology RN, with a total moderate sedation time of 10
minutes.

PROCEDURE:
The procedure, risks, benefits, and alternatives were explained to
the patient. Questions regarding the procedure were encouraged and
answered. The patient understands and consents to the procedure.

Survey ultrasound of the right parotid region was performed and
complex 2.5 cm well demarcated mass was again localized. An
appropriate skin entry site was determined and marked.

The operative field was prepped with chlorhexidine in a sterile
fashion, and a sterile drape was applied covering the operative
field. A sterile gown and sterile gloves were used for the
procedure. Local anesthesia was provided with 1% Lidocaine.

Under real-time ultrasound guidance, multiple 18-gauge core biopsy
samples were obtained, submitted in formalin to surgical pathology.
The guide needle was removed. Postprocedure scans show no hemorrhage
or other apparent complication. The patient tolerated the procedure
well.

COMPLICATIONS:
None.
FINDINGS: Complex 2.5 cm well-demarcated mass into the right parotid gland was
again localized. Representative core biopsy samples obtained as
above.
IMPRESSION: 1. Technically successful ultrasound-guided core biopsy, right
parotid mass.

## 2019-08-25 MED ORDER — HYDROCODONE-ACETAMINOPHEN 5-325 MG PO TABS: 1.0000 | ORAL_TABLET | ORAL | Status: DC | PRN

## 2019-08-25 MED ORDER — FENTANYL CITRATE (PF) 100 MCG/2ML IJ SOLN
INTRAMUSCULAR | Status: AC | PRN
  Administered 2019-08-25: 25 ug via INTRAVENOUS

## 2019-08-25 MED ORDER — SODIUM CHLORIDE 0.9 % IV SOLN: INTRAVENOUS | Status: DC

## 2019-08-25 MED ORDER — MIDAZOLAM HCL 5 MG/5ML IJ SOLN
INTRAMUSCULAR | Status: AC
  Filled 2019-08-25: qty 5

## 2019-08-25 MED ORDER — FENTANYL CITRATE (PF) 100 MCG/2ML IJ SOLN
INTRAMUSCULAR | Status: AC
  Filled 2019-08-25: qty 2

## 2019-08-25 MED ORDER — MIDAZOLAM HCL 5 MG/5ML IJ SOLN
INTRAMUSCULAR | Status: AC | PRN
  Administered 2019-08-25: 1 mg via INTRAVENOUS

## 2019-08-25 NOTE — Discharge Instructions (Signed)
Needle Biopsy, Care After This sheet gives you information about how to care for yourself after your procedure. Your health care provider may also give you more specific instructions. If you have problems or questions, contact your health care provider. What can I expect after the procedure? After the procedure, it is common to have soreness, bruising, or mild pain at the puncture site. This should go away in a few days. Follow these instructions at home: Needle insertion site care   Wash your hands with soap and water before you change your bandage (dressing). If you cannot use soap and water, use hand sanitizer.  Follow instructions from your health care provider about how to take care of your puncture site. This includes: ? When and how to change your dressing. ? When to remove your dressing.  Check your puncture site every day for signs of infection. Check for: ? Redness, swelling, or pain. ? Fluid or blood. ? Pus or a bad smell. ? Warmth. General instructions  Return to your normal activities as told by your health care provider. Ask your health care provider what activities are safe for you.  Do not take baths, swim, or use a hot tub until your health care provider approves. Ask your health care provider if you may take showers. You may only be allowed to take sponge baths.  Take over-the-counter and prescription medicines only as told by your health care provider.  Keep all follow-up visits as told by your health care provider. This is important. Contact a health care provider if:  You have a fever.  You have redness, swelling, or pain at the puncture site that lasts longer than a few days.  You have fluid, blood, or pus coming from your puncture site.  Your puncture site feels warm to the touch. Get help right away if:  You have severe bleeding from the puncture site. Summary  After the procedure, it is common to have soreness, bruising, or mild pain at the puncture  site. This should go away in a few days.  Check your puncture site every day for signs of infection, such as redness, swelling, or pain.  Get help right away if you have severe bleeding from your puncture site. This information is not intended to replace advice given to you by your health care provider. Make sure you discuss any questions you have with your health care provider. Document Revised: 09/28/2017 Document Reviewed: 07/30/2017 Elsevier Patient Education  2020 Elsevier Inc.  

## 2019-08-25 NOTE — H&P (Signed)
Chief Complaint: Patient was seen in consultation today for a right parotid mass biopsy.  Referring Physician(s): Vaught,Creighton  Supervising Physician: Arne Cleveland  Patient Status: [facility] - Out-pt  History of Present Illness: Patricia Ewing is a 69 y.o. female with a past medical history significant for glaucoma, DM, HLD, HTN, a.fib, NSTEMI who presents today for a right parotid mass biopsy. Ms. Patricia Ewing reports that she has been followed by Dr. Pryor Ewing for about 5 years and he was the first person to note the swelling on the right side of her face. She reports that they have been monitoring the area regularly and that there were previously no concerns, however she noticed the area becomes sore and more swollen at different times lately. Outside imaging report from [facility] dated 07/09/19 notable for 2.33 x 1.58 x 2.44 cm hypoechoic mass inferior to the right mandible and posterior to the salivary gland with an irregular border. It was recommended that she undergo a biopsy of this area for further evaluation.   Past Medical History:  Diagnosis Date  . A-fib ([place])   . Diabetes mellitus    diagnosed at age 66, on a "pill" (?metformin), but now diet-controlled  . Glaucoma   . Hyperlipidemia    managed with lifestyle modification  . Hypertension   . NSTEMI (non-ST elevated myocardial infarction) ([place])   . Tobacco abuse    No PFT's or diagnosis of COPD    Past Surgical History:  Procedure Laterality Date  . ABDOMINAL HYSTERECTOMY  1984  . BREAST CYST EXCISION     abcess drainage  . COLONOSCOPY WITH PROPOFOL    . COLONOSCOPY WITH PROPOFOL N/A 09/11/2018   Procedure: COLONOSCOPY WITH PROPOFOL;  Surgeon: Manya Silvas, MD;  Location: [facility] ENDOSCOPY;  Service: Endoscopy;  Laterality: N/A;    Allergies: Contrast media [iodinated diagnostic agents], Erythromycin, and Metrizamide  Medications: Prior to Admission medications   Medication Sig Start Date End  Date Taking? Authorizing Provider  albuterol (PROVENTIL HFA;VENTOLIN HFA) 108 (90 BASE) MCG/ACT inhaler Inhale 1 puff into the lungs every 6 (six) hours as needed. Shortness of breath    [provider]  aspirin 81 MG tablet Take 81 mg by mouth daily.    [provider]  atorvastatin (LIPITOR) 40 MG tablet Take 1 tablet (40 mg total) by mouth daily at 6 PM. 12/06/15   Demetrios Loll, MD  brimonidine [facility]) 0.2 % ophthalmic solution Place 1 drop into both eyes 2 (two) times daily.    [provider]  cetirizine (ZYRTEC) 10 MG tablet Take 10 mg by mouth daily as needed. allergies    [provider]  cloNIDine (CATAPRES) 0.2 MG tablet Take 0.2 mg by mouth daily.    [provider]  furosemide (LASIX) 40 MG tablet Take 40 mg by mouth 2 (two) times daily.    [provider]  latanoprost (XALATAN) 0.005 % ophthalmic solution Place 1 drop into both eyes.    [provider]  metoprolol (LOPRESSOR) 50 MG tablet Take 1 tablet (50 mg total) by mouth 2 (two) times daily. 12/06/15   Demetrios Loll, MD  nitroGLYCERIN (NITROSTAT) 0.4 MG SL tablet Place 1 tablet (0.4 mg total) under the tongue every 5 (five) minutes as needed for chest pain. 12/06/15   Demetrios Loll, MD  polyethylene glycol [facility]) packet Take 17 g by mouth daily. 12/06/15   Demetrios Loll, MD  potassium chloride SA (K-DUR,KLOR-CON) 20 MEQ tablet Take  20 mEq by mouth 2 (two) times daily.    [provider]     Family History  Problem Relation Age of Onset  . Heart attack Father 56  . Lymphoma Son   . Colon cancer Other   . Breast cancer Neg Hx     Social History   Socioeconomic History  . Marital status: Married    Spouse name: Not on file  . Number of children: Not on file  . Years of education: Not on file  . Highest education level: Not on file  Occupational History  . Not on file  Tobacco Use  . Smoking status: Current Every Day Smoker    Packs/day: 0.50     Years: 40.00    Pack years: 20.00    Types: Cigarettes  . Smokeless tobacco: Never Used  Substance and Sexual Activity  . Alcohol use: Yes    Alcohol/week: 1.0 - 2.0 standard drinks    Types: 1 - 2 Standard drinks or equivalent per week  . Drug use: No  . Sexual activity: Not on file  Other Topics Concern  . 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   Social Determinants of Health   Financial Resource Strain:   . Difficulty of Paying Living Expenses: Not on file  Food Insecurity:   . Worried About Charity fundraiser in the Last Year: Not on file  . Ran Out of Food in the Last Year: Not on file  Transportation Needs:   . Lack of Transportation (Medical): Not on file  . Lack of Transportation (Non-Medical): Not on file  Physical Activity:   . Days of Exercise per Week: Not on file  . Minutes of Exercise per Session: Not on file  Stress:   . Feeling of Stress : Not on file  Social Connections:   . Frequency of Communication with Friends and Family: Not on file  . Frequency of Social Gatherings with Friends and Family: Not on file  . Attends Religious Services: Not on file  . Active Member of Clubs or Organizations: Not on file  . Attends Archivist Meetings: Not on file  . Marital Status: Not on file     Review of Systems: A 12 point ROS discussed and pertinent positives are indicated in the HPI above.  All other systems are negative.  Review of Systems  Constitutional: Negative for appetite change, chills, fatigue and fever.  HENT: Positive for facial swelling (right side). Negative for ear pain, nosebleeds, sore throat, tinnitus and trouble swallowing.   Respiratory: Negative for cough and shortness of breath.   Cardiovascular: Negative for chest pain.  Gastrointestinal: Negative for abdominal pain, diarrhea, nausea and vomiting.  Skin: Negative for rash and wound.  Neurological: Negative for dizziness  and headaches.    Vital Signs: There were no vitals taken for this visit.  Physical Exam Vitals reviewed.  Constitutional:      General: She is not in acute distress. HENT:     Head: Normocephalic.     Mouth/Throat:     Mouth: Mucous membranes are moist.     Pharynx: Oropharynx is clear. No oropharyngeal exudate or posterior oropharyngeal erythema.  Cardiovascular:     Rate and Rhythm: Normal rate and regular rhythm.  Pulmonary:     Effort: Pulmonary effort is normal.     Breath sounds: Normal breath sounds.  Abdominal:  General: There is no distension.     Palpations: Abdomen is soft.     Tenderness: There is no abdominal tenderness.  Skin:    General: Skin is warm and dry.  Neurological:     Mental Status: She is alert and oriented to person, place, and time.  Psychiatric:        Mood and Affect: Mood normal.        Behavior: Behavior normal.        Thought Content: Thought content normal.        Judgment: Judgment normal.      MD Evaluation Airway: WNL Heart: WNL Abdomen: WNL Chest/ Lungs: WNL ASA  Classification: 2 Mallampati/Airway Score: Two   Imaging: [place] OUTSIDE FILMS SOFT TISSUE NECK  Result Date: 08/13/2019 This examination belongs to an outside facility and is stored here for comparison purposes only.  Contact the originating outside institution for any associated report or interpretation.   Labs:  CBC: No results for input(s): WBC, HGB, HCT, PLT in the last 8760 hours.  COAGS: No results for input(s): INR, APTT in the last 8760 hours.  BMP: No results for input(s): NA, K, CL, CO2, GLUCOSE, BUN, CALCIUM, CREATININE, GFRNONAA, GFRAA in the last 8760 hours.  Invalid input(s): CMP  LIVER FUNCTION TESTS: No results for input(s): BILITOT, AST, ALT, ALKPHOS, PROT, ALBUMIN in the last 8760 hours.  TUMOR MARKERS: No results for input(s): AFPTM, CEA, CA199, CHROMGRNA in the last 8760 hours.  Assessment and Plan:  69 y/o F with known right  parotid mass of unknown etiology since 2015 per patient report who presents today for an image guided biopsy of this mass to provide further information.  Patient has been NPO since 10 pm last night - she would like to attempt the biopsy without moderate sedation first, but is open to receiving sedation if needed.  Risks and benefits of right parotid mass biopsy was discussed with the patient and/or patient's family including, but not limited to bleeding, infection, damage to adjacent structures or low yield requiring additional tests.  All of the questions were answered and there is agreement to proceed.  Consent signed and in chart.  Thank you for this interesting consult.  I greatly enjoyed meeting REATHEL HOLSMAN and look forward to participating in their care.  A copy of this report was sent to the requesting provider on this date.  Electronically Signed: Joaquim Nam, PA-C 08/25/2019, 9:12 AM   I spent a total of30 Minutes in face to face in clinical consultation, greater than 50% of which was counseling/coordinating care for right parotid mass biopsy.

## 2019-08-25 NOTE — Sedation Documentation (Signed)
Total conscious sedation: Versed 1 mg IV, Fentanyl 50 mcg IV. Pt. Tolerated procedure well.

## 2019-08-25 NOTE — Procedures (Signed)
  Procedure: [place] core R parotid nodule   EBL:   minimal Complications:  none immediate  See full dictation in [employer].  Dillard Cannon MD Main # [phone] Pager  [phone]

## 2019-08-26 LAB — SURGICAL PATHOLOGY

## 2019-09-08 ENCOUNTER — Ambulatory Visit: Payer: Medicare Other | Attending: Internal Medicine

## 2019-09-08 DIAGNOSIS — Z23 Encounter for immunization: Secondary | ICD-10-CM

## 2019-09-08 NOTE — Progress Notes (Signed)
   Covid-19 Vaccination Clinic  Name:  Patricia Ewing    MRN: [id number] DOB: 1950-08-09  09/08/2019  [name] was observed post Covid-19 immunization for 15 minutes without incidence. She was provided with Vaccine Information Sheet and instruction to access the V-Safe system.   [name] was instructed to call 911 with any severe reactions post vaccine: Marland Kitchen Difficulty breathing  . Swelling of your face and throat  . A fast heartbeat  . A bad rash all over your body  . Dizziness and weakness    Immunizations Administered    Name Date Dose VIS Date Route   Moderna COVID-19 Vaccine 09/08/2019 11:27 AM 0.5 mL 07/01/2019 Intramuscular   Manufacturer: Moderna   Lot: [id number]   [id number]

## 2019-09-29 ENCOUNTER — Ambulatory Visit
Admission: RE | Admit: 2019-09-29 | Discharge: 2019-09-29 | Disposition: A | Discharge status: Home / Self Care | Payer: Medicare Other | Source: Ambulatory Visit | Attending: Internal Medicine | Admitting: Internal Medicine

## 2019-09-29 DIAGNOSIS — Z1231 Encounter for screening mammogram for malignant neoplasm of breast: Secondary | ICD-10-CM | POA: Diagnosis present

## 2019-09-29 IMAGING — MG DIGITAL SCREENING BILAT W/ TOMO W/ CAD
8 of 14 series · 8 of 40 positions shown · non-contrast
Comparison: Previous exam(s).

ACR Breast Density Category a: The breast tissue is almost entirely
fatty.

CLINICAL DATA: Screening.

EXAM:
DIGITAL SCREENING BILATERAL MAMMOGRAM WITH TOMO AND CAD

[R MLO synth-2D (1 of 3)]
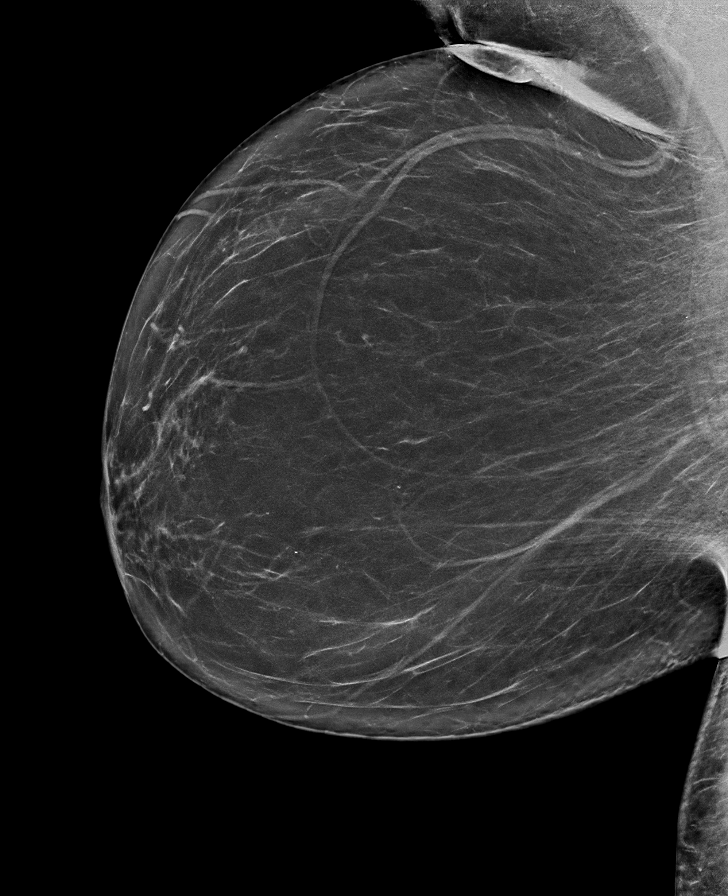

[L MLO synth-2D]
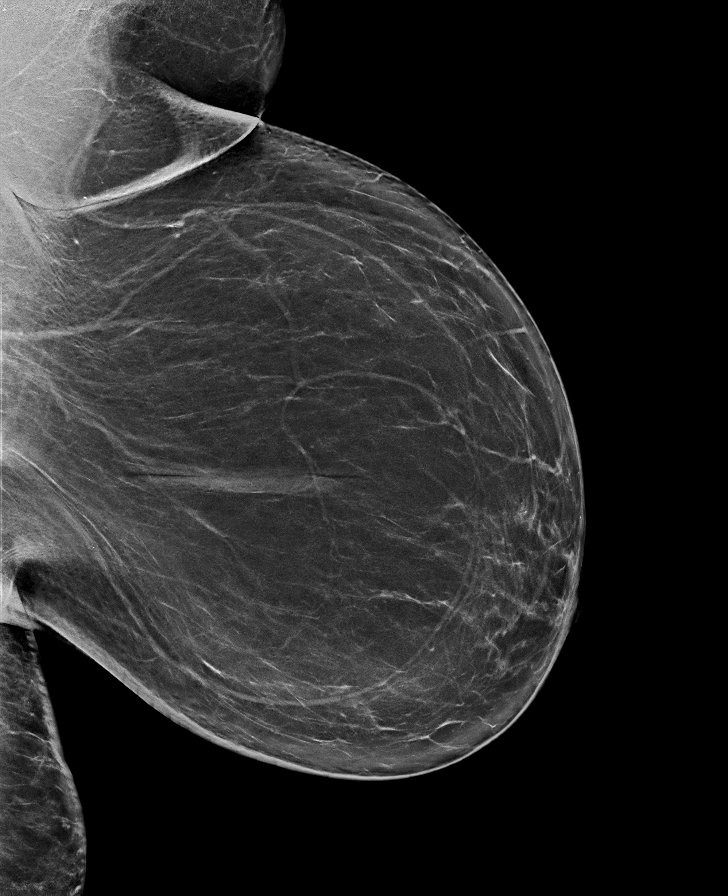

[L CC synth-2D]
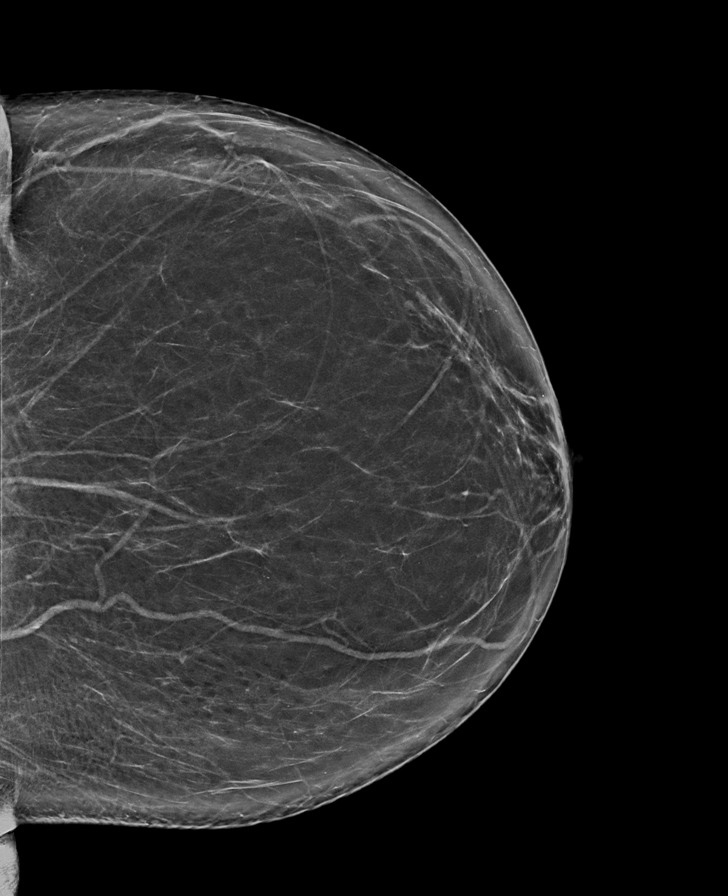

[R MLO synth-2D (2 of 3)]
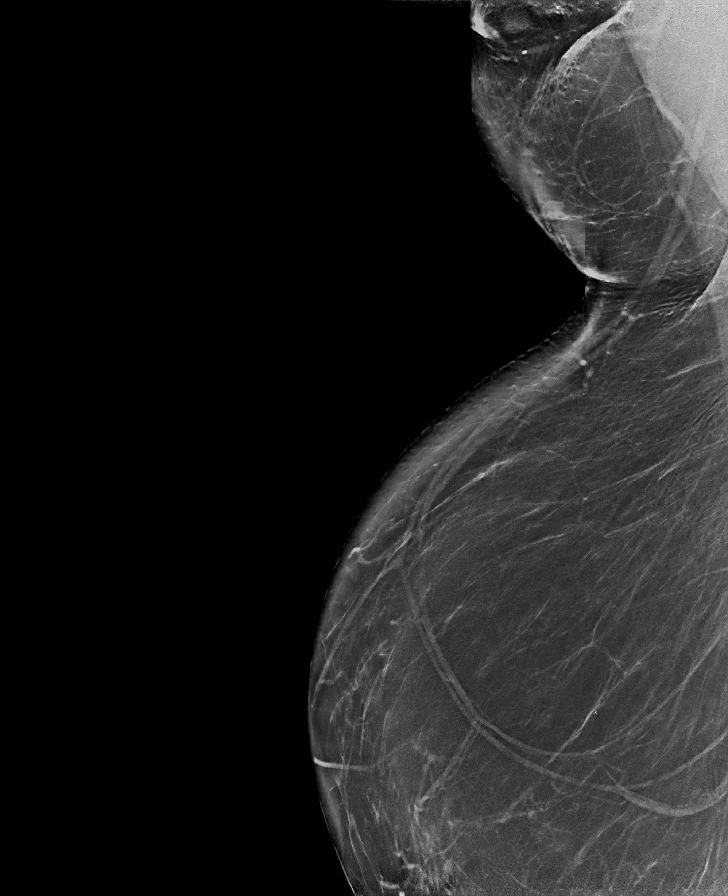

[R MLO synth-2D (3 of 3)]
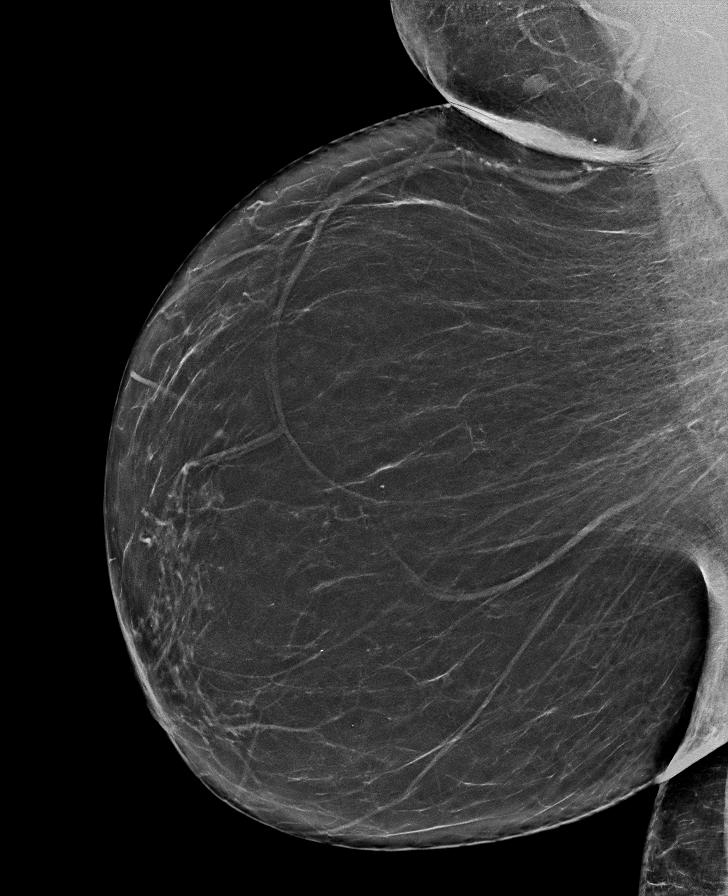

[R CC synth-2D]
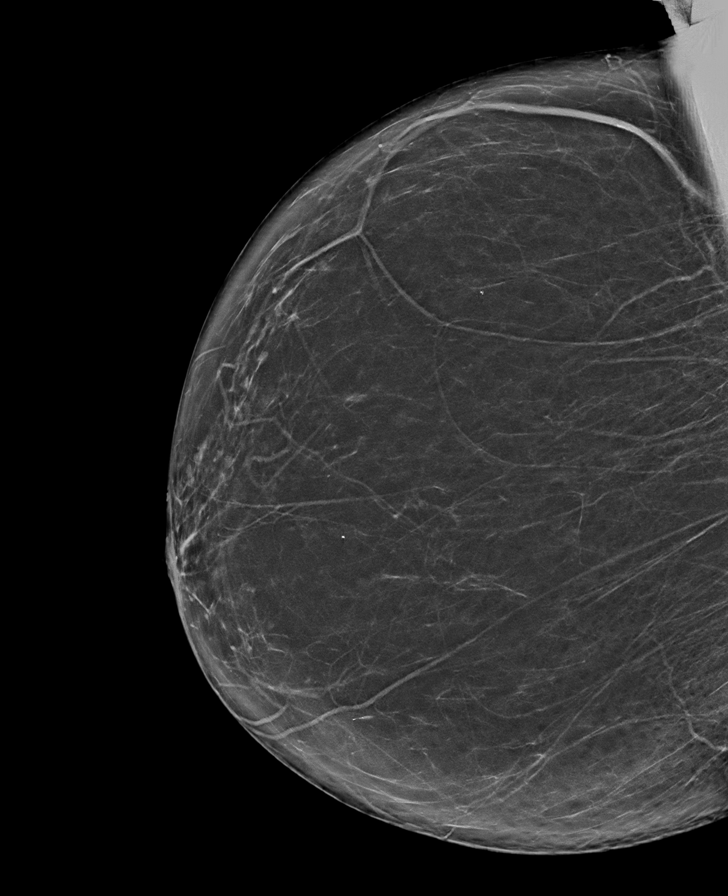

[R XCCL synth-2D]
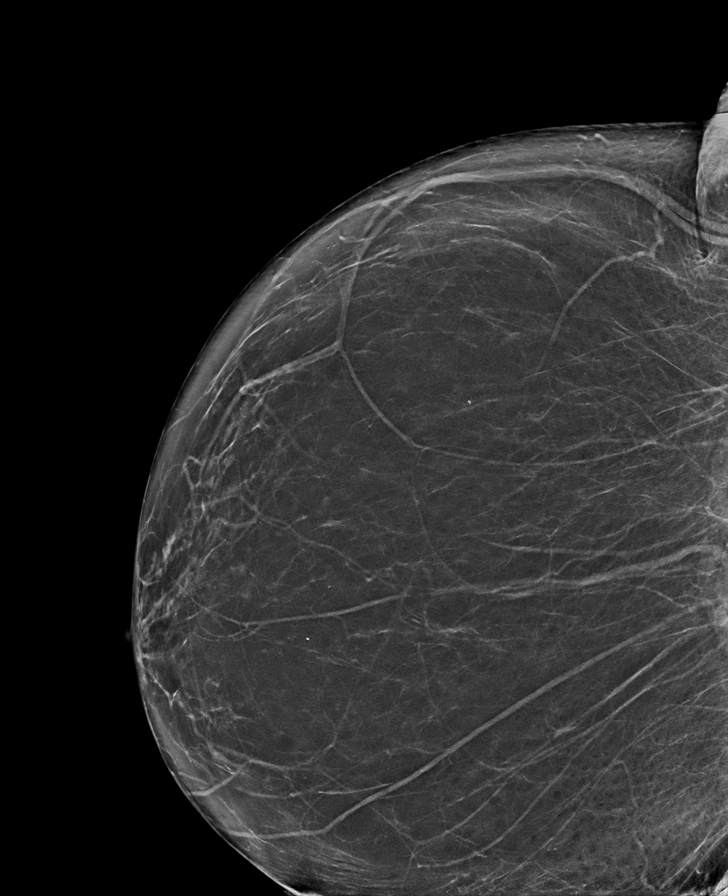

[R MLO tomo · tomo slice 41/82.0]
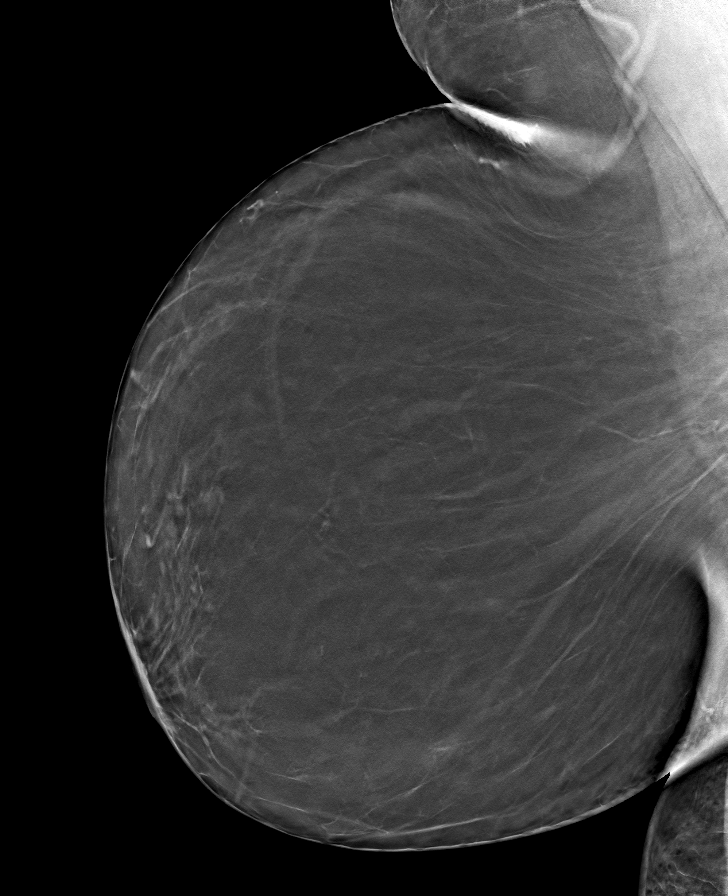

[8 of 40 positions shown; findings below may reference images not displayed]

FINDINGS: There are no findings suspicious for malignancy. Images were
processed with CAD.
IMPRESSION: No mammographic evidence of malignancy. A result letter of this
screening mammogram will be mailed directly to the patient.

RECOMMENDATION:
Screening mammogram in one year. (Code:[TA])

BI-RADS CATEGORY  1: Negative.

## 2019-10-08 ENCOUNTER — Ambulatory Visit: Payer: Medicare Other | Attending: Internal Medicine

## 2019-10-08 DIAGNOSIS — Z23 Encounter for immunization: Secondary | ICD-10-CM

## 2019-10-08 NOTE — Progress Notes (Signed)
   Covid-19 Vaccination Clinic  Name:  Patricia Ewing    MRN: [id number] DOB: 1951/04/30  10/08/2019  Patricia Ewing was observed post Covid-19 immunization for 15 minutes without incident. She was provided with Vaccine Information Sheet and instruction to access the V-Safe system.   Patricia Ewing was instructed to call 911 with any severe reactions post vaccine: Marland Kitchen Difficulty breathing  . Swelling of face and throat  . A fast heartbeat  . A bad rash all over body  . Dizziness and weakness   Immunizations Administered    Name Date Dose VIS Date Route   Moderna COVID-19 Vaccine 10/08/2019 11:22 AM 0.5 mL 07/01/2019 Intramuscular   Manufacturer: Moderna   Lot: [id number]   [id number]

## 2019-12-10 ENCOUNTER — Other Ambulatory Visit: Payer: Self-pay

## 2019-12-10 ENCOUNTER — Encounter: Payer: Self-pay | Admitting: Ophthalmology

## 2019-12-16 ENCOUNTER — Other Ambulatory Visit
Admission: RE | Admit: 2019-12-16 | Discharge: 2019-12-16 | Disposition: A | Discharge status: Home / Self Care | Payer: Medicare Other | Source: Ambulatory Visit | Attending: Ophthalmology | Admitting: Ophthalmology

## 2019-12-16 DIAGNOSIS — Z20822 Contact with and (suspected) exposure to covid-19: Secondary | ICD-10-CM | POA: Insufficient documentation

## 2019-12-16 DIAGNOSIS — Z01812 Encounter for preprocedural laboratory examination: Secondary | ICD-10-CM | POA: Insufficient documentation

## 2019-12-16 NOTE — Discharge Instructions (Signed)

## 2019-12-17 LAB — SARS CORONAVIRUS 2 (TAT 6-24 HRS): SARS Coronavirus 2: NEGATIVE

## 2019-12-18 ENCOUNTER — Other Ambulatory Visit: Payer: Self-pay

## 2019-12-18 ENCOUNTER — Ambulatory Visit
Admission: RE | Admit: 2019-12-18 | Discharge: 2019-12-18 | Disposition: A | Discharge status: Home / Self Care | Payer: Medicare Other | Attending: Ophthalmology | Admitting: Ophthalmology

## 2019-12-18 ENCOUNTER — Encounter: Payer: Self-pay | Admitting: Ophthalmology

## 2019-12-18 ENCOUNTER — Ambulatory Visit: Payer: Medicare Other | Admitting: Anesthesiology

## 2019-12-18 ENCOUNTER — Encounter
Admission: RE | Disposition: A | Discharge status: Home / Self Care | Payer: Self-pay | Source: Home / Self Care | Attending: Ophthalmology

## 2019-12-18 DIAGNOSIS — Z6839 Body mass index (BMI) 39.0-39.9, adult: Secondary | ICD-10-CM | POA: Insufficient documentation

## 2019-12-18 DIAGNOSIS — H2512 Age-related nuclear cataract, left eye: Secondary | ICD-10-CM | POA: Diagnosis not present

## 2019-12-18 DIAGNOSIS — I10 Essential (primary) hypertension: Secondary | ICD-10-CM | POA: Diagnosis not present

## 2019-12-18 DIAGNOSIS — Z79899 Other long term (current) drug therapy: Secondary | ICD-10-CM | POA: Diagnosis not present

## 2019-12-18 DIAGNOSIS — I4891 Unspecified atrial fibrillation: Secondary | ICD-10-CM | POA: Insufficient documentation

## 2019-12-18 HISTORY — DX: Presence of dental prosthetic device (complete) (partial): Z97.2

## 2019-12-18 HISTORY — DX: Bronchitis, not specified as acute or chronic: J40

## 2019-12-18 HISTORY — PX: CATARACT EXTRACTION W/PHACO: SHX586

## 2019-12-18 HISTORY — DX: Pneumonia, unspecified organism: J18.9

## 2019-12-18 SURGERY — PHACOEMULSIFICATION, CATARACT, WITH IOL INSERTION
Anesthesia: Topical | Site: Eye | Laterality: Left

## 2019-12-18 MED ORDER — LIDOCAINE HCL (PF) 2 % IJ SOLN
INTRAOCULAR | Status: DC | PRN
  Administered 2019-12-18: 1 mL via INTRAOCULAR

## 2019-12-18 MED ORDER — BRIMONIDINE TARTRATE-TIMOLOL 0.2-0.5 % OP SOLN
OPHTHALMIC | Status: DC | PRN
  Administered 2019-12-18: 1 [drp] via OPHTHALMIC

## 2019-12-18 MED ORDER — MOXIFLOXACIN HCL 0.5 % OP SOLN
OPHTHALMIC | Status: DC | PRN
  Administered 2019-12-18: 0.2 mL via OPHTHALMIC

## 2019-12-18 MED ORDER — ARMC OPHTHALMIC DILATING DROPS
1.0000 "application " | OPHTHALMIC | Status: DC | PRN
  Administered 2019-12-18 (×3): 1 via OPHTHALMIC

## 2019-12-18 MED ORDER — NA CHONDROIT SULF-NA HYALURON 40-17 MG/ML IO SOLN
INTRAOCULAR | Status: DC | PRN
  Administered 2019-12-18: 1 mL via INTRAOCULAR

## 2019-12-18 MED ORDER — TRYPAN BLUE 0.06 % OP SOLN
OPHTHALMIC | Status: DC | PRN
  Administered 2019-12-18: 0.5 mL via INTRAOCULAR

## 2019-12-18 MED ORDER — TETRACAINE 0.5 % OP SOLN OPTIME - NO CHARGE
OPHTHALMIC | Status: DC | PRN
  Administered 2019-12-18: 2 [drp] via OPHTHALMIC

## 2019-12-18 MED ORDER — TETRACAINE HCL 0.5 % OP SOLN
1.0000 [drp] | OPHTHALMIC | Status: DC | PRN
  Administered 2019-12-18 (×3): 1 [drp] via OPHTHALMIC

## 2019-12-18 MED ORDER — EPINEPHRINE PF 1 MG/ML IJ SOLN
INTRAOCULAR | Status: DC | PRN
  Administered 2019-12-18: 50 mL via OPHTHALMIC

## 2019-12-18 MED ORDER — PROVISC 10 MG/ML IO SOLN
INTRAOCULAR | Status: DC | PRN
  Administered 2019-12-18: 0.55 mL via INTRAOCULAR

## 2019-12-18 MED ORDER — FENTANYL CITRATE (PF) 100 MCG/2ML IJ SOLN
INTRAMUSCULAR | Status: DC | PRN
  Administered 2019-12-18: 50 ug via INTRAVENOUS

## 2019-12-18 MED ORDER — LACTATED RINGERS IV SOLN: 10.0000 mL/h | INTRAVENOUS | Status: DC

## 2019-12-18 MED ORDER — ACETAMINOPHEN 160 MG/5ML PO SOLN: 325.0000 mg | Freq: Once | ORAL | Status: DC

## 2019-12-18 MED ORDER — MIDAZOLAM HCL 2 MG/2ML IJ SOLN
INTRAMUSCULAR | Status: DC | PRN
  Administered 2019-12-18: 1 mg via INTRAVENOUS

## 2019-12-18 MED ORDER — ACETAMINOPHEN 325 MG PO TABS: 325.0000 mg | ORAL_TABLET | Freq: Once | ORAL | Status: DC

## 2019-12-18 SURGICAL SUPPLY — 18 items
DISSECTOR HYDRO NUCLEUS 50X22 (MISCELLANEOUS) ×12 IMPLANT
DRSG TEGADERM 2-3/8X2-3/4 SM (GAUZE/BANDAGES/DRESSINGS) ×3 IMPLANT
GLOVE BIOGEL PI IND STRL 8 (GLOVE) ×1 IMPLANT
GLOVE BIOGEL PI INDICATOR 8 (GLOVE) ×2
GOWN STRL REUS W/ TWL LRG LVL3 (GOWN DISPOSABLE) ×1 IMPLANT
GOWN STRL REUS W/ TWL XL LVL3 (GOWN DISPOSABLE) ×1 IMPLANT
GOWN STRL REUS W/TWL LRG LVL3 (GOWN DISPOSABLE) ×2
GOWN STRL REUS W/TWL XL LVL3 (GOWN DISPOSABLE) ×2
KNIFE 45D UP 2.3 (MISCELLANEOUS) ×3 IMPLANT
LENS IOL DIOP 22.5 (Intraocular Lens) ×3 IMPLANT
LENS IOL TECNIS MONO 22.5 (Intraocular Lens) IMPLANT
MARKER SKIN DUAL TIP RULER LAB (MISCELLANEOUS) ×2 IMPLANT
PACK CATARACT (MISCELLANEOUS) ×3 IMPLANT
PACK DR. KING ARMS (PACKS) ×3 IMPLANT
PACK EYE AFTER SURG (MISCELLANEOUS) ×3 IMPLANT
SOLUTION OPHTHALMIC SALT (MISCELLANEOUS) ×3 IMPLANT
WATER STERILE IRR 250ML POUR (IV SOLUTION) ×3 IMPLANT
WIPE NON LINTING 3.25X3.25 (MISCELLANEOUS) ×3 IMPLANT

## 2019-12-18 NOTE — Transfer of Care (Signed)
Immediate Anesthesia Transfer of Care Note  Patient: KYRIAH HUIBREGTSE  Procedure(s) Performed: CATARACT EXTRACTION PHACO AND INTRAOCULAR LENS PLACEMENT (IOC) LEFT VISION BLUE 3.43  00:22.5 (Left Eye)  Patient Location: PACU  Anesthesia Type: No value filed.  Level of Consciousness: awake, alert  and patient cooperative  Airway and Oxygen Therapy: Patient Spontanous Breathing and Patient connected to supplemental oxygen  Post-op Assessment: Post-op Vital signs reviewed, Patient's Cardiovascular Status Stable, Respiratory Function Stable, Patent Airway and No signs of Nausea or vomiting  Post-op Vital Signs: Reviewed and stable  Complications: No apparent anesthesia complications

## 2019-12-18 NOTE — H&P (Signed)
   I have reviewed the patient's H&P and agree with its findings. There have been no interval changes.  Tameisha Covell MD Ophthalmology 

## 2019-12-18 NOTE — Anesthesia Procedure Notes (Signed)
Procedure Name: MAC Performed by: Audrick Lamoureaux, CRNA Pre-anesthesia Checklist: Patient identified, Emergency Drugs available, Suction available, Timeout performed and Patient being monitored Patient Re-evaluated:Patient Re-evaluated prior to induction Oxygen Delivery Method: Nasal cannula Placement Confirmation: positive ETCO2       

## 2019-12-18 NOTE — Anesthesia Preprocedure Evaluation (Signed)
Anesthesia Evaluation  Patient identified by MRN, date of birth, ID band Patient awake    Reviewed: Allergy & Precautions, H&P , NPO status , Patient's Chart, lab work & pertinent test results  Airway Mallampati: III  TM Distance: >3 FB Neck ROM: full    Dental no notable dental hx.    Pulmonary Current Smoker,    Pulmonary exam normal breath sounds clear to auscultation       Cardiovascular hypertension, Atrial Fibrillation  Rhythm:regular Rate:Normal     Neuro/Psych    GI/Hepatic   Endo/Other  Morbid obesity  Renal/GU      Musculoskeletal   Abdominal   Peds  Hematology   Anesthesia Other Findings Pt is hypertensive this AM.  Reports likely due to anxiety.  She did not take her metoprolol or carvedilol today, and reports that she has not taken those for at least a month due to untoward side effects. She reports good BPs at home on the Lasix and Clonidine alone, both of which she took today.  Reproductive/Obstetrics                             Anesthesia Physical Anesthesia Plan  ASA: III  Anesthesia Plan:    Post-op Pain Management:    Induction:   PONV Risk Score and Plan: 2 and Treatment may vary due to age or medical condition, TIVA and Midazolam  Airway Management Planned:   Additional Equipment:   Intra-op Plan:   Post-operative Plan:   Informed Consent: I have reviewed the patients History and Physical, chart, labs and discussed the procedure including the risks, benefits and alternatives for the proposed anesthesia with the patient or authorized representative who has indicated his/her understanding and acceptance.     Dental Advisory Given  Plan Discussed with: CRNA  Anesthesia Plan Comments:         Anesthesia Quick Evaluation

## 2019-12-18 NOTE — Op Note (Signed)
  PREOPERATIVE DIAGNOSIS:  Nuclear sclerotic cataract of the LEFT eye.   POSTOPERATIVE DIAGNOSIS:  Nuclear sclerotic cataract of the LEFT eye.   OPERATIVE PROCEDURE: Cataract surgery OS   SURGEON:  Marchia Meiers, MD.   ANESTHESIA:  Anesthesiologist: Ronelle Nigh, MD CRNA: Mayme Genta, CRNA  1.      Managed anesthesia care. 2.     0.49ml of Shugarcaine was instilled following the paracentesis   COMPLICATIONS:  None.   TECHNIQUE:   Divide and conquer   DESCRIPTION OF PROCEDURE:  The patient was examined and consented in the preoperative holding area where the aforementioned topical anesthesia was applied to the LEFT eye and then brought back to the Operating Room where the left eye was prepped and draped in the usual sterile ophthalmic fashion and a lid speculum was placed. A paracentesis was created with the side port blade, the anterior chamber was washed out with trypan blue to stain the anterior capsule, and the anterior chamber was filled with viscoelastic. A near clear corneal incision was performed with the steel keratome. A continuous curvilinear capsulorrhexis was performed with a cystotome followed by the capsulorrhexis forceps. Hydrodissection and hydrodelineation were carried out with BSS on a blunt cannula. The lens was removed in a divide and conquer  technique and the remaining cortical material was removed with the irrigation-aspiration handpiece. The capsular bag was inflated with viscoelastic and the lens was placed in the capsular bag without complication. The remaining viscoelastic was removed from the eye with the irrigation-aspiration handpiece. The wounds were hydrated. The anterior chamber was flushed and the eye was inflated to physiologic pressure. 0.36ml Vigamox was placed in the anterior chamber. The wounds were found to be water tight. The eye was dressed with Vigamox. The patient was given protective glasses to wear throughout the day and a shield with which to  sleep tonight. The patient was also given drops with which to begin a drop regimen today and will follow-up with me in one day. Implant Name Type Inv. Item Serial No. Manufacturer Lot No. LRB No. Used Action  LENS IOL DIOP 22.5 - [id number] Intraocular Lens LENS IOL DIOP 22.5 [id number]  Left 1 Implanted    Procedure(s): CATARACT EXTRACTION PHACO AND INTRAOCULAR LENS PLACEMENT (IOC) LEFT VISION BLUE 3.43  00:22.5 (Left)  Electronically signed: Marchia Meiers 12/18/2019 9:45 AM

## 2019-12-18 NOTE — Anesthesia Postprocedure Evaluation (Signed)
Anesthesia Post Note  Patient: Patricia Ewing  Procedure(s) Performed: CATARACT EXTRACTION PHACO AND INTRAOCULAR LENS PLACEMENT (IOC) LEFT VISION BLUE 3.43  00:22.5 (Left Eye)     Patient location during evaluation: PACU Level of consciousness: awake and alert and oriented Pain management: satisfactory to patient Vital Signs Assessment: post-procedure vital signs reviewed and stable Respiratory status: spontaneous breathing, nonlabored ventilation and respiratory function stable Cardiovascular status: blood pressure returned to baseline and stable Postop Assessment: Adequate PO intake and No signs of nausea or vomiting Anesthetic complications: no    Raliegh Ip

## 2019-12-19 ENCOUNTER — Encounter: Payer: Self-pay | Admitting: *Deleted

## 2020-01-05 ENCOUNTER — Encounter: Payer: Self-pay | Admitting: Ophthalmology

## 2020-01-13 ENCOUNTER — Other Ambulatory Visit
Admission: RE | Admit: 2020-01-13 | Discharge: 2020-01-13 | Disposition: A | Discharge status: Home / Self Care | Payer: Medicare Other | Source: Ambulatory Visit | Attending: Ophthalmology | Admitting: Ophthalmology

## 2020-01-13 ENCOUNTER — Other Ambulatory Visit: Payer: Self-pay

## 2020-01-13 DIAGNOSIS — Z20822 Contact with and (suspected) exposure to covid-19: Secondary | ICD-10-CM | POA: Insufficient documentation

## 2020-01-13 DIAGNOSIS — Z01812 Encounter for preprocedural laboratory examination: Secondary | ICD-10-CM | POA: Diagnosis present

## 2020-01-13 LAB — SARS CORONAVIRUS 2 (TAT 6-24 HRS): SARS Coronavirus 2: NEGATIVE

## 2020-01-13 NOTE — Discharge Instructions (Signed)

## 2020-01-15 ENCOUNTER — Encounter: Payer: Self-pay | Admitting: Ophthalmology

## 2020-01-15 ENCOUNTER — Other Ambulatory Visit: Payer: Self-pay

## 2020-01-15 ENCOUNTER — Encounter
Admission: RE | Disposition: A | Discharge status: Home / Self Care | Payer: Self-pay | Source: Ambulatory Visit | Attending: Ophthalmology

## 2020-01-15 ENCOUNTER — Ambulatory Visit
Admission: RE | Admit: 2020-01-15 | Discharge: 2020-01-15 | Disposition: A | Discharge status: Home / Self Care | Payer: Medicare Other | Source: Ambulatory Visit | Attending: Ophthalmology | Admitting: Ophthalmology

## 2020-01-15 ENCOUNTER — Ambulatory Visit: Payer: Medicare Other | Admitting: Anesthesiology

## 2020-01-15 DIAGNOSIS — Z881 Allergy status to other antibiotic agents status: Secondary | ICD-10-CM | POA: Diagnosis not present

## 2020-01-15 DIAGNOSIS — I10 Essential (primary) hypertension: Secondary | ICD-10-CM | POA: Insufficient documentation

## 2020-01-15 DIAGNOSIS — Z79899 Other long term (current) drug therapy: Secondary | ICD-10-CM | POA: Insufficient documentation

## 2020-01-15 DIAGNOSIS — J449 Chronic obstructive pulmonary disease, unspecified: Secondary | ICD-10-CM | POA: Diagnosis not present

## 2020-01-15 DIAGNOSIS — F17200 Nicotine dependence, unspecified, uncomplicated: Secondary | ICD-10-CM | POA: Diagnosis not present

## 2020-01-15 DIAGNOSIS — H2511 Age-related nuclear cataract, right eye: Secondary | ICD-10-CM | POA: Diagnosis present

## 2020-01-15 DIAGNOSIS — H409 Unspecified glaucoma: Secondary | ICD-10-CM | POA: Insufficient documentation

## 2020-01-15 DIAGNOSIS — I4891 Unspecified atrial fibrillation: Secondary | ICD-10-CM | POA: Insufficient documentation

## 2020-01-15 HISTORY — PX: CATARACT EXTRACTION W/PHACO: SHX586

## 2020-01-15 SURGERY — PHACOEMULSIFICATION, CATARACT, WITH IOL INSERTION
Anesthesia: Monitor Anesthesia Care | Site: Eye | Laterality: Right

## 2020-01-15 MED ORDER — EPINEPHRINE PF 1 MG/ML IJ SOLN
INTRAOCULAR | Status: DC | PRN
  Administered 2020-01-15: 65 mL via OPHTHALMIC

## 2020-01-15 MED ORDER — TETRACAINE 0.5 % OP SOLN OPTIME - NO CHARGE
OPHTHALMIC | Status: DC | PRN
  Administered 2020-01-15: 2 [drp] via OPHTHALMIC

## 2020-01-15 MED ORDER — BRIMONIDINE TARTRATE-TIMOLOL 0.2-0.5 % OP SOLN
OPHTHALMIC | Status: DC | PRN
  Administered 2020-01-15: 1 [drp] via OPHTHALMIC

## 2020-01-15 MED ORDER — MIDAZOLAM HCL 2 MG/2ML IJ SOLN
INTRAMUSCULAR | Status: DC | PRN
  Administered 2020-01-15: 1 mg via INTRAVENOUS

## 2020-01-15 MED ORDER — ARMC OPHTHALMIC DILATING DROPS
1.0000 "application " | OPHTHALMIC | Status: DC | PRN
  Administered 2020-01-15 (×3): 1 via OPHTHALMIC

## 2020-01-15 MED ORDER — FENTANYL CITRATE (PF) 100 MCG/2ML IJ SOLN
INTRAMUSCULAR | Status: DC | PRN
  Administered 2020-01-15: 25 ug via INTRAVENOUS
  Administered 2020-01-15: 50 ug via INTRAVENOUS
  Administered 2020-01-15: 25 ug via INTRAVENOUS

## 2020-01-15 MED ORDER — LIDOCAINE HCL (PF) 2 % IJ SOLN
INTRAOCULAR | Status: DC | PRN
  Administered 2020-01-15: 1 mL via INTRAOCULAR

## 2020-01-15 MED ORDER — ACETAMINOPHEN 160 MG/5ML PO SOLN: 325.0000 mg | ORAL | Status: DC | PRN

## 2020-01-15 MED ORDER — PROVISC 10 MG/ML IO SOLN
INTRAOCULAR | Status: DC | PRN
  Administered 2020-01-15: 0.55 mL via INTRAOCULAR

## 2020-01-15 MED ORDER — ACETAMINOPHEN 325 MG PO TABS: 325.0000 mg | ORAL_TABLET | ORAL | Status: DC | PRN

## 2020-01-15 MED ORDER — TETRACAINE HCL 0.5 % OP SOLN
1.0000 [drp] | OPHTHALMIC | Status: DC | PRN
  Administered 2020-01-15 (×3): 1 [drp] via OPHTHALMIC

## 2020-01-15 MED ORDER — NA CHONDROIT SULF-NA HYALURON 40-17 MG/ML IO SOLN
INTRAOCULAR | Status: DC | PRN
  Administered 2020-01-15: 1 mL via INTRAOCULAR

## 2020-01-15 MED ORDER — MOXIFLOXACIN HCL 0.5 % OP SOLN
OPHTHALMIC | Status: DC | PRN
  Administered 2020-01-15: 0.2 mL via OPHTHALMIC

## 2020-01-15 MED ORDER — LABETALOL HCL 5 MG/ML IV SOLN
INTRAVENOUS | Status: DC | PRN
Start: 2020-01-15 — End: 2020-01-15
  Administered 2020-01-15 (×3): 5 mg via INTRAVENOUS

## 2020-01-15 SURGICAL SUPPLY — 18 items
DISSECTOR HYDRO NUCLEUS 50X22 (MISCELLANEOUS) ×10 IMPLANT
DRSG TEGADERM 2-3/8X2-3/4 SM (GAUZE/BANDAGES/DRESSINGS) ×3 IMPLANT
GLOVE BIOGEL PI IND STRL 8 (GLOVE) ×1 IMPLANT
GLOVE BIOGEL PI INDICATOR 8 (GLOVE) ×2
GOWN STRL REUS W/ TWL LRG LVL3 (GOWN DISPOSABLE) ×1 IMPLANT
GOWN STRL REUS W/ TWL XL LVL3 (GOWN DISPOSABLE) ×1 IMPLANT
GOWN STRL REUS W/TWL LRG LVL3 (GOWN DISPOSABLE) ×2
GOWN STRL REUS W/TWL XL LVL3 (GOWN DISPOSABLE) ×2
KNIFE 45D UP 2.3 (MISCELLANEOUS) ×3 IMPLANT
LENS IOL DIOP 22.5 (Intraocular Lens) ×3 IMPLANT
LENS IOL TECNIS MONO 22.5 (Intraocular Lens) IMPLANT
MARKER SKIN DUAL TIP RULER LAB (MISCELLANEOUS) ×3 IMPLANT
PACK CATARACT (MISCELLANEOUS) ×3 IMPLANT
PACK DR. KING ARMS (PACKS) ×3 IMPLANT
PACK EYE AFTER SURG (MISCELLANEOUS) ×3 IMPLANT
SOLUTION OPHTHALMIC SALT (MISCELLANEOUS) ×3 IMPLANT
WATER STERILE IRR 250ML POUR (IV SOLUTION) ×3 IMPLANT
WIPE NON LINTING 3.25X3.25 (MISCELLANEOUS) ×3 IMPLANT

## 2020-01-15 NOTE — H&P (Signed)
   I have reviewed the patient's H&P and agree with its findings. There have been no interval changes.  Kailene Steinhart MD Ophthalmology 

## 2020-01-15 NOTE — Anesthesia Preprocedure Evaluation (Signed)
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), Current Smoker and Patient abstained from smoking.,    Pulmonary exam normal breath sounds clear to auscultation       Cardiovascular Exercise Tolerance: Good hypertension, + dysrhythmias Atrial Fibrillation  Rhythm:regular Rate:Normal     Neuro/Psych glaucoma negative psych ROS   GI/Hepatic negative GI ROS, Neg liver ROS,   Endo/Other  negative endocrine ROS  Renal/GU negative Renal ROS  negative genitourinary   Musculoskeletal   Abdominal   Peds  Hematology negative hematology ROS (+)   Anesthesia Other Findings   Reproductive/Obstetrics negative OB ROS                             Anesthesia Physical Anesthesia Plan  ASA: III  Anesthesia Plan: MAC   Post-op Pain Management:    Induction:   PONV Risk Score and Plan: 1 and Treatment may vary due to age or medical condition  Airway Management Planned:   Additional Equipment:   Intra-op Plan:   Post-operative Plan:   Informed Consent: I have reviewed the patients History and Physical, chart, labs and discussed the procedure including the risks, benefits and alternatives for the proposed anesthesia with the patient or authorized representative who has indicated his/her understanding and acceptance.     Dental Advisory Given  Plan Discussed with: CRNA  Anesthesia Plan Comments:         Anesthesia Quick Evaluation

## 2020-01-15 NOTE — Anesthesia Procedure Notes (Signed)
Procedure Name: MAC Performed by: Yahmir Sokolov, CRNA Pre-anesthesia Checklist: Patient identified, Emergency Drugs available, Suction available, Timeout performed and Patient being monitored Patient Re-evaluated:Patient Re-evaluated prior to induction Oxygen Delivery Method: Nasal cannula Placement Confirmation: positive ETCO2       

## 2020-01-15 NOTE — Op Note (Signed)
  PREOPERATIVE DIAGNOSIS:  Nuclear sclerotic cataract of the RIGHT eye.   POSTOPERATIVE DIAGNOSIS:  Nuclear sclerotic cataract of the RIGHT eye.   OPERATIVE PROCEDURE: Cataract surgery OD   SURGEON:  Marchia Meiers, MD.   ANESTHESIA:  Anesthesiologist: Alisa Graff, MD CRNA: Mayme Genta, CRNA  1.      Managed anesthesia care. 2.     0.33ml of Shugarcaine was instilled following the paracentesis   COMPLICATIONS:  None.   TECHNIQUE:   Divide and conquer   DESCRIPTION OF PROCEDURE:  The patient was examined and consented in the preoperative holding area where the aforementioned topical anesthesia was applied to the RIGHT eye and then brought back to the Operating Room where the RIGHT eye was prepped and draped in the usual sterile ophthalmic fashion and a lid speculum was placed. A paracentesis was created with the side port blade, the anterior chamber was washed out with trypan blue to stain the anterior capsule, and the anterior chamber was filled with viscoelastic. A near clear corneal incision was performed with the steel keratome. A continuous curvilinear capsulorrhexis was performed with a cystotome followed by the capsulorrhexis forceps. Hydrodissection and hydrodelineation were carried out with BSS on a blunt cannula. The lens was removed in a divide and conquer  technique and the remaining cortical material was removed with the irrigation-aspiration handpiece. The capsular bag was inflated with viscoelastic and the lens was placed in the capsular bag without complication. The remaining viscoelastic was removed from the eye with the irrigation-aspiration handpiece. The wounds were hydrated. The anterior chamber was flushed and the eye was inflated to physiologic pressure. 0.68ml Vigamox was placed in the anterior chamber. The wounds were found to be water tight. The eye was dressed with Vigamox. The patient was given protective glasses to wear throughout the day and a shield with which to  sleep tonight. The patient was also given drops with which to begin a drop regimen today and will follow-up with me in one day. Implant Name Type Inv. Item Serial No. Manufacturer Lot No. LRB No. Used Action  LENS IOL DIOP 22.5 - [id number] Intraocular Lens LENS IOL DIOP 22.5 [id number]  Right 1 Implanted    Procedure(s): CATARACT EXTRACTION PHACO AND INTRAOCULAR LENS PLACEMENT (IOC) RIGHT  6.29  00:39.6 (Right)  Electronically signed: Marchia Meiers 01/15/2020 12:03 PM

## 2020-01-15 NOTE — Anesthesia Postprocedure Evaluation (Signed)
Anesthesia Post Note  Patient: Patricia Ewing  Procedure(s) Performed: CATARACT EXTRACTION PHACO AND INTRAOCULAR LENS PLACEMENT (IOC) RIGHT  6.29  00:39.6 (Right Eye)     Patient location during evaluation: PACU Anesthesia Type: MAC Level of consciousness: awake and alert Pain management: pain level controlled Vital Signs Assessment: post-procedure vital signs reviewed and stable Respiratory status: spontaneous breathing, nonlabored ventilation, respiratory function stable and patient connected to nasal cannula oxygen Cardiovascular status: stable and blood pressure returned to baseline Postop Assessment: no apparent nausea or vomiting Anesthetic complications: no   No complications documented.  Alisa Graff

## 2020-01-15 NOTE — Transfer of Care (Signed)
Immediate Anesthesia Transfer of Care Note  Patient: Patricia Ewing  Procedure(s) Performed: CATARACT EXTRACTION PHACO AND INTRAOCULAR LENS PLACEMENT (IOC) RIGHT  6.29  00:39.6 (Right Eye)  Patient Location: PACU  Anesthesia Type: MAC  Level of Consciousness: awake, alert  and patient cooperative  Airway and Oxygen Therapy: Patient Spontanous Breathing and Patient connected to supplemental oxygen  Post-op Assessment: Post-op Vital signs reviewed, Patient's Cardiovascular Status Stable, Respiratory Function Stable, Patent Airway and No signs of Nausea or vomiting  Post-op Vital Signs: Reviewed and stable  Complications: No complications documented.

## 2020-01-16 ENCOUNTER — Encounter: Payer: Self-pay | Admitting: Ophthalmology

## 2020-08-12 ENCOUNTER — Observation Stay: Payer: Medicare Other

## 2020-08-12 ENCOUNTER — Encounter: Payer: Self-pay | Admitting: Emergency Medicine

## 2020-08-12 ENCOUNTER — Emergency Department: Payer: Medicare Other

## 2020-08-12 ENCOUNTER — Other Ambulatory Visit: Payer: Self-pay

## 2020-08-12 ENCOUNTER — Inpatient Hospital Stay
Admission: EM | Admit: 2020-08-12 | Discharge: 2020-08-18 | DRG: 065 | Disposition: A | Discharge status: Skilled Nursing Facility | Payer: Medicare Other | Attending: Internal Medicine | Admitting: Internal Medicine

## 2020-08-12 DIAGNOSIS — Z91041 Radiographic dye allergy status: Secondary | ICD-10-CM

## 2020-08-12 DIAGNOSIS — R29706 NIHSS score 6: Secondary | ICD-10-CM | POA: Diagnosis present

## 2020-08-12 DIAGNOSIS — I674 Hypertensive encephalopathy: Secondary | ICD-10-CM | POA: Diagnosis present

## 2020-08-12 DIAGNOSIS — N1832 Chronic kidney disease, stage 3b: Secondary | ICD-10-CM | POA: Diagnosis present

## 2020-08-12 DIAGNOSIS — Z8249 Family history of ischemic heart disease and other diseases of the circulatory system: Secondary | ICD-10-CM

## 2020-08-12 DIAGNOSIS — I6389 Other cerebral infarction: Secondary | ICD-10-CM | POA: Diagnosis not present

## 2020-08-12 DIAGNOSIS — N179 Acute kidney failure, unspecified: Secondary | ICD-10-CM | POA: Diagnosis not present

## 2020-08-12 DIAGNOSIS — F17210 Nicotine dependence, cigarettes, uncomplicated: Secondary | ICD-10-CM | POA: Diagnosis present

## 2020-08-12 DIAGNOSIS — Z6834 Body mass index (BMI) 34.0-34.9, adult: Secondary | ICD-10-CM

## 2020-08-12 DIAGNOSIS — Z888 Allergy status to other drugs, medicaments and biological substances status: Secondary | ICD-10-CM

## 2020-08-12 DIAGNOSIS — E519 Thiamine deficiency, unspecified: Secondary | ICD-10-CM | POA: Diagnosis present

## 2020-08-12 DIAGNOSIS — E669 Obesity, unspecified: Secondary | ICD-10-CM | POA: Diagnosis not present

## 2020-08-12 DIAGNOSIS — Z79899 Other long term (current) drug therapy: Secondary | ICD-10-CM

## 2020-08-12 DIAGNOSIS — R5381 Other malaise: Secondary | ICD-10-CM | POA: Diagnosis present

## 2020-08-12 DIAGNOSIS — I129 Hypertensive chronic kidney disease with stage 1 through stage 4 chronic kidney disease, or unspecified chronic kidney disease: Secondary | ICD-10-CM | POA: Diagnosis present

## 2020-08-12 DIAGNOSIS — Z90710 Acquired absence of both cervix and uterus: Secondary | ICD-10-CM

## 2020-08-12 DIAGNOSIS — Z20822 Contact with and (suspected) exposure to covid-19: Secondary | ICD-10-CM | POA: Diagnosis present

## 2020-08-12 DIAGNOSIS — I480 Paroxysmal atrial fibrillation: Secondary | ICD-10-CM | POA: Diagnosis present

## 2020-08-12 DIAGNOSIS — H409 Unspecified glaucoma: Secondary | ICD-10-CM | POA: Diagnosis present

## 2020-08-12 DIAGNOSIS — I252 Old myocardial infarction: Secondary | ICD-10-CM

## 2020-08-12 DIAGNOSIS — R270 Ataxia, unspecified: Secondary | ICD-10-CM | POA: Diagnosis present

## 2020-08-12 DIAGNOSIS — R471 Dysarthria and anarthria: Secondary | ICD-10-CM | POA: Diagnosis present

## 2020-08-12 DIAGNOSIS — R4182 Altered mental status, unspecified: Secondary | ICD-10-CM | POA: Diagnosis present

## 2020-08-12 DIAGNOSIS — Z881 Allergy status to other antibiotic agents status: Secondary | ICD-10-CM

## 2020-08-12 DIAGNOSIS — R410 Disorientation, unspecified: Secondary | ICD-10-CM

## 2020-08-12 DIAGNOSIS — E538 Deficiency of other specified B group vitamins: Secondary | ICD-10-CM | POA: Diagnosis present

## 2020-08-12 DIAGNOSIS — E876 Hypokalemia: Secondary | ICD-10-CM | POA: Diagnosis present

## 2020-08-12 DIAGNOSIS — I248 Other forms of acute ischemic heart disease: Secondary | ICD-10-CM | POA: Diagnosis present

## 2020-08-12 DIAGNOSIS — R778 Other specified abnormalities of plasma proteins: Secondary | ICD-10-CM

## 2020-08-12 DIAGNOSIS — Z7982 Long term (current) use of aspirin: Secondary | ICD-10-CM

## 2020-08-12 DIAGNOSIS — R4781 Slurred speech: Secondary | ICD-10-CM

## 2020-08-12 DIAGNOSIS — E785 Hyperlipidemia, unspecified: Secondary | ICD-10-CM | POA: Diagnosis present

## 2020-08-12 DIAGNOSIS — I10 Essential (primary) hypertension: Secondary | ICD-10-CM

## 2020-08-12 DIAGNOSIS — R4701 Aphasia: Secondary | ICD-10-CM | POA: Diagnosis present

## 2020-08-12 DIAGNOSIS — I2510 Atherosclerotic heart disease of native coronary artery without angina pectoris: Secondary | ICD-10-CM | POA: Diagnosis present

## 2020-08-12 DIAGNOSIS — Z8673 Personal history of transient ischemic attack (TIA), and cerebral infarction without residual deficits: Secondary | ICD-10-CM

## 2020-08-12 DIAGNOSIS — I700 Atherosclerosis of aorta: Secondary | ICD-10-CM | POA: Diagnosis present

## 2020-08-12 DIAGNOSIS — I63522 Cerebral infarction due to unspecified occlusion or stenosis of left anterior cerebral artery: Secondary | ICD-10-CM | POA: Diagnosis present

## 2020-08-12 DIAGNOSIS — I639 Cerebral infarction, unspecified: Secondary | ICD-10-CM

## 2020-08-12 DIAGNOSIS — E039 Hypothyroidism, unspecified: Secondary | ICD-10-CM | POA: Diagnosis present

## 2020-08-12 LAB — COMPREHENSIVE METABOLIC PANEL
ALT: 13 U/L (ref 0–44)
AST: 17 U/L (ref 15–41)
Albumin: 4 g/dL (ref 3.5–5.0)
Alkaline Phosphatase: 73 U/L (ref 38–126)
Anion gap: 13 (ref 5–15)
BUN: 20 mg/dL (ref 8–23)
CO2: 24 mmol/L (ref 22–32)
Calcium: 9.3 mg/dL (ref 8.9–10.3)
Chloride: 103 mmol/L (ref 98–111)
Creatinine, Ser: 1.42 mg/dL — ABNORMAL HIGH (ref 0.44–1.00)
GFR, Estimated: 40 mL/min — ABNORMAL LOW (ref >60)
Glucose, Bld: 135 mg/dL — ABNORMAL HIGH (ref 70–99)
Potassium: 3.4 mmol/L — ABNORMAL LOW (ref 3.5–5.1)
Sodium: 140 mmol/L (ref 135–145)
Total Bilirubin: 0.7 mg/dL (ref 0.3–1.2)
Total Protein: 8.3 g/dL — ABNORMAL HIGH (ref 6.5–8.1)

## 2020-08-12 LAB — URINALYSIS, COMPLETE (UACMP) WITH MICROSCOPIC
Bacteria, UA: NONE SEEN
Bilirubin Urine: NEGATIVE
Glucose, UA: NEGATIVE mg/dL
Ketones, ur: NEGATIVE mg/dL
Leukocytes,Ua: NEGATIVE
Nitrite: NEGATIVE
Protein, ur: 100 mg/dL — AB
Specific Gravity, Urine: 1.02 (ref 1.005–1.030)
Squamous Epithelial / HPF: NONE SEEN (ref 0–5)
pH: 5 (ref 5.0–8.0)

## 2020-08-12 LAB — IRON AND TIBC
Iron: 63 ug/dL (ref 28–170)
Saturation Ratios: 19 % (ref 10.4–31.8)
TIBC: 333 ug/dL (ref 250–450)
UIBC: 270 ug/dL

## 2020-08-12 LAB — FERRITIN: Ferritin: 78 ng/mL (ref 11–307)

## 2020-08-12 LAB — CBC
HCT: 38.4 % (ref 36.0–46.0)
Hemoglobin: 12.8 g/dL (ref 12.0–15.0)
MCH: 31.6 pg (ref 26.0–34.0)
MCHC: 33.3 g/dL (ref 30.0–36.0)
MCV: 94.8 fL (ref 80.0–100.0)
Platelets: 214 10*3/uL (ref 150–400)
RBC: 4.05 MIL/uL (ref 3.87–5.11)
RDW: 13.2 % (ref 11.5–15.5)
WBC: 6.4 10*3/uL (ref 4.0–10.5)
nRBC: 0 % (ref 0.0–0.2)

## 2020-08-12 LAB — MAGNESIUM: Magnesium: 2.3 mg/dL (ref 1.7–2.4)

## 2020-08-12 LAB — RETICULOCYTES
Immature Retic Fract: 7.3 % (ref 2.3–15.9)
RBC.: 3.85 MIL/uL — ABNORMAL LOW (ref 3.87–5.11)
Retic Count, Absolute: 50.1 10*3/uL (ref 19.0–186.0)
Retic Ct Pct: 1.3 % (ref 0.4–3.1)

## 2020-08-12 LAB — VITAMIN B12: Vitamin B-12: 286 pg/mL (ref 180–914)

## 2020-08-12 LAB — TROPONIN I (HIGH SENSITIVITY): Troponin I (High Sensitivity): 179 ng/L (ref <18)

## 2020-08-12 LAB — FOLATE: Folate: 13.1 ng/mL (ref >5.9)

## 2020-08-12 IMAGING — CR DG CHEST 2V
1 series · 3 of 3 positions shown · non-contrast
Comparison: [DATE]

CLINICAL DATA: Weakness.

EXAM:
CHEST - 2 VIEW

[Series 1: dg chest 2 view · 0.14mm/px · 3 of 3 slices shown]
[im 1/3]
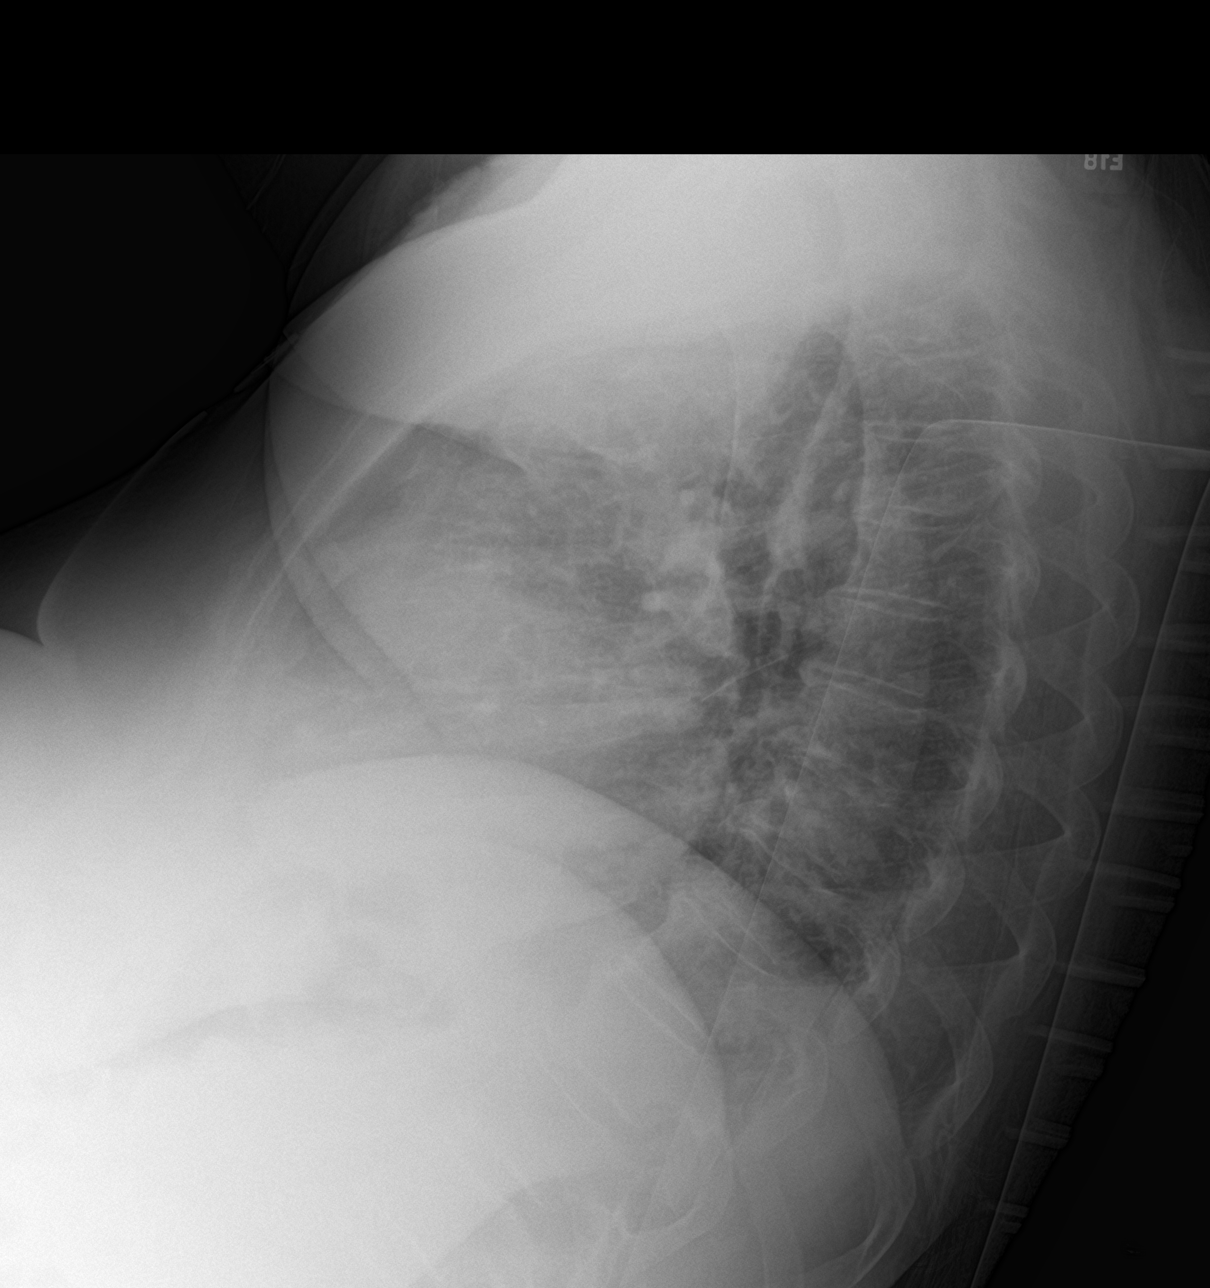
[im 2/3]
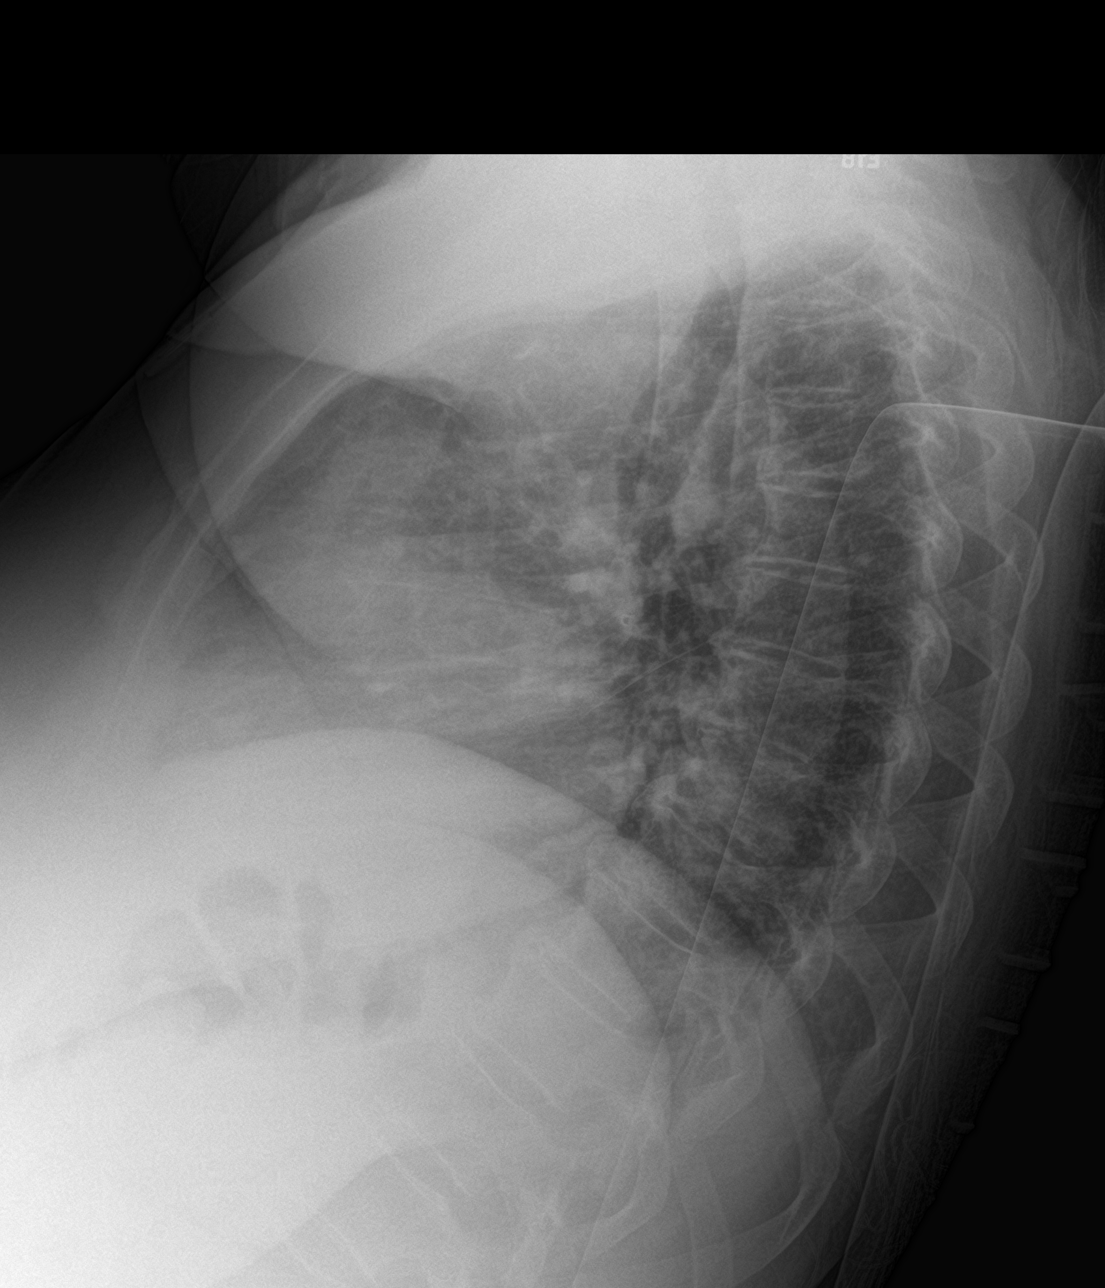
[im 3/3]
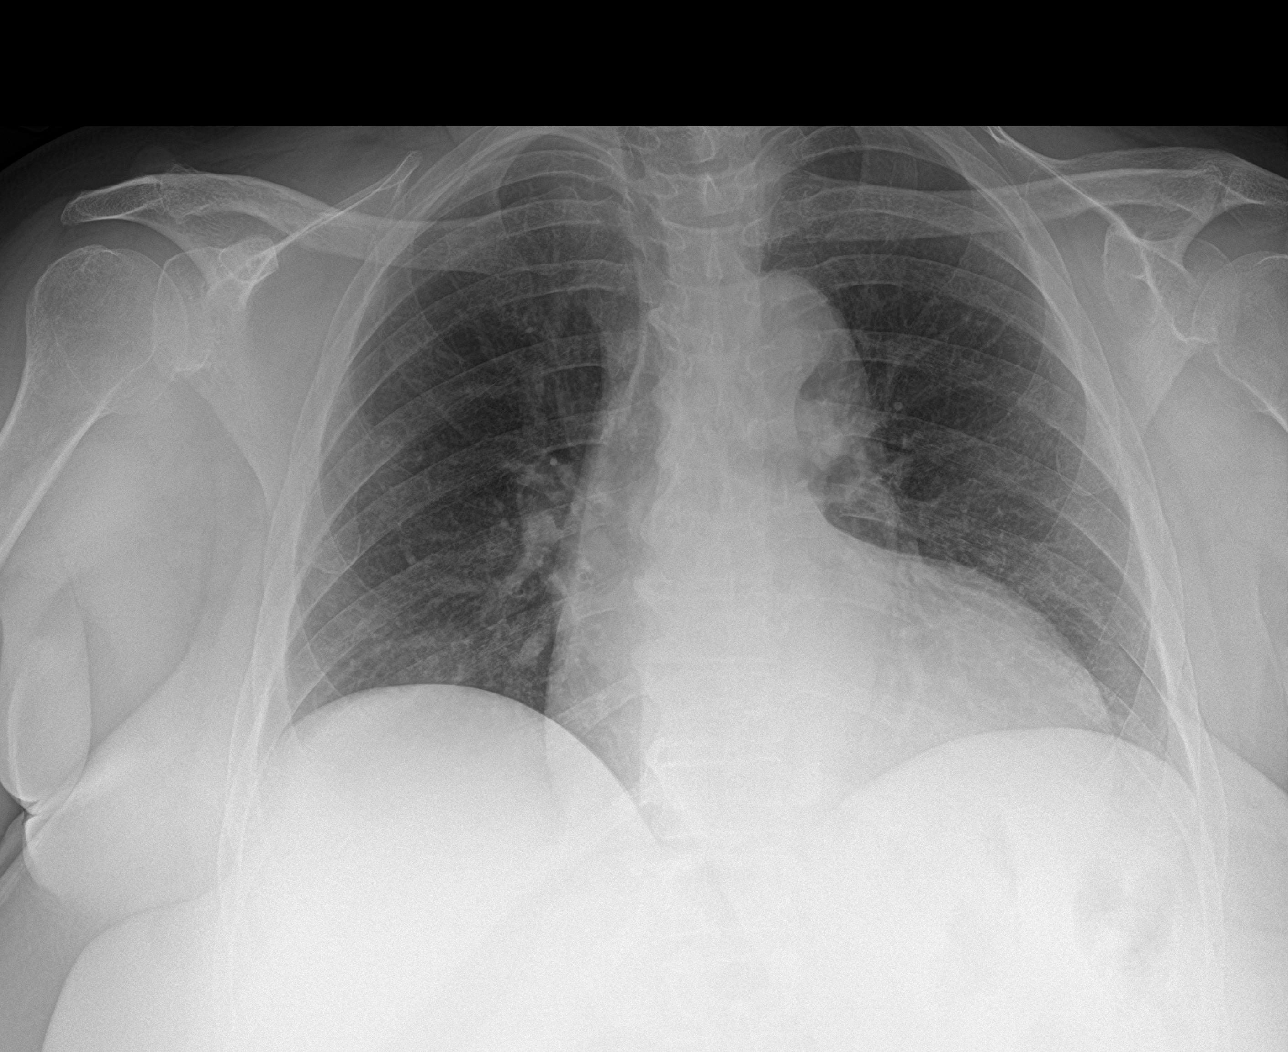

[3 of 3 positions shown; findings below may reference images not displayed]

FINDINGS: Stable cardiac enlargement. No pleural effusion or edema. No
airspace densities identified. Visualized osseous structures are
unremarkable.
IMPRESSION: No acute cardiopulmonary abnormalities.

## 2020-08-12 IMAGING — US US RENAL
1 series · 14 of 25 positions shown · non-contrast
Comparison: [DATE]

CLINICAL DATA: Acute kidney injury

EXAM:
RENAL / URINARY TRACT ULTRASOUND COMPLETE

[Series 1: us renal · 14 of 47 slices shown]
[im 1/47]
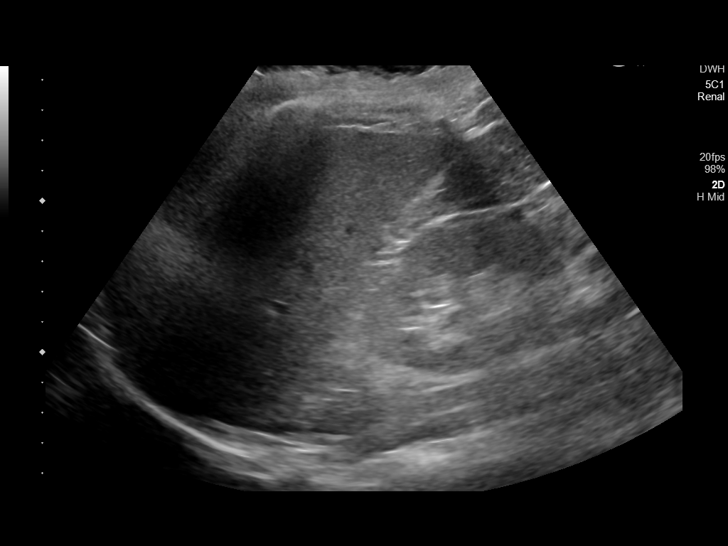
[im 4/47]
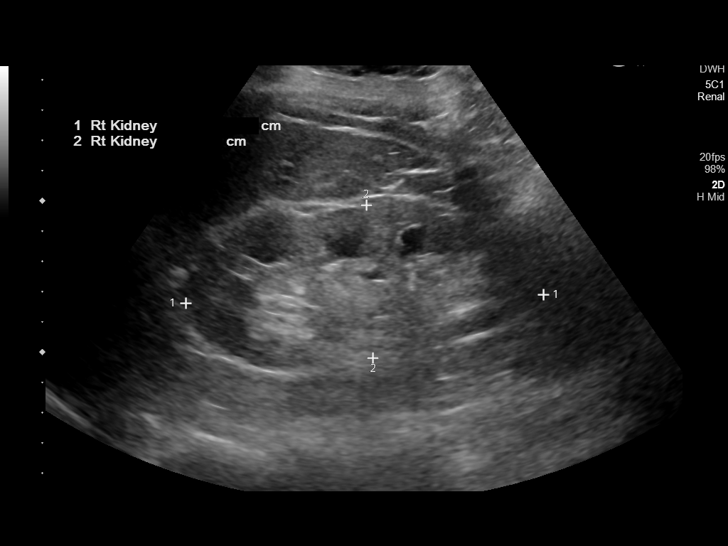
[im 8/47]
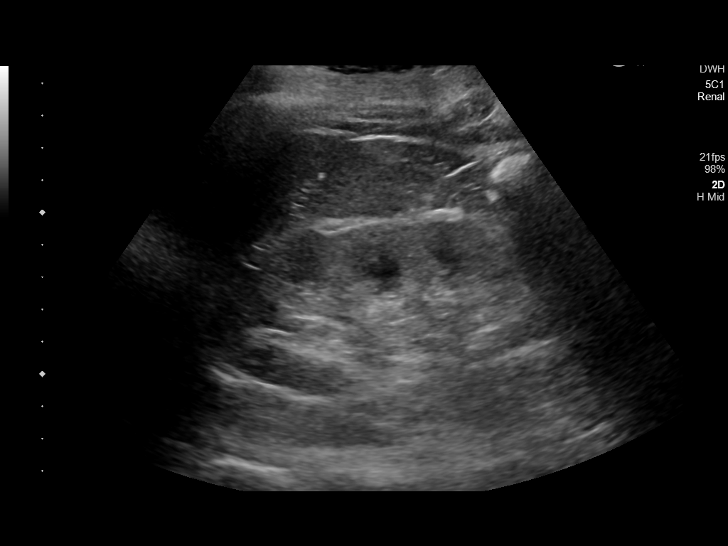
[im 12/47]
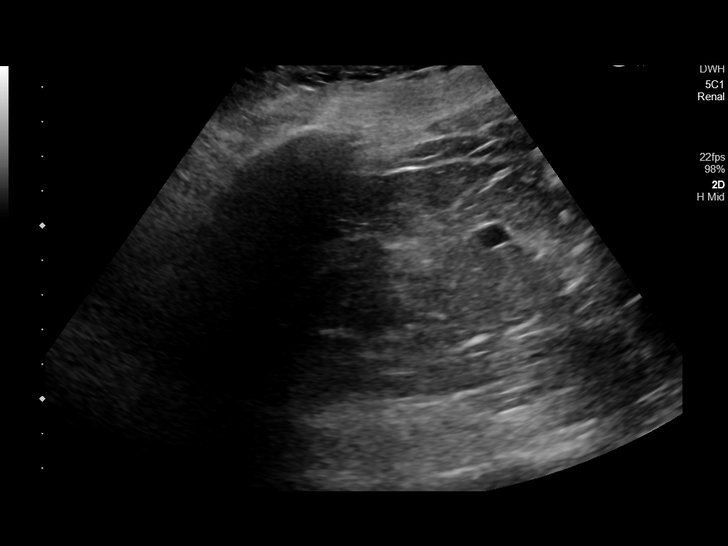
[im 16/47]
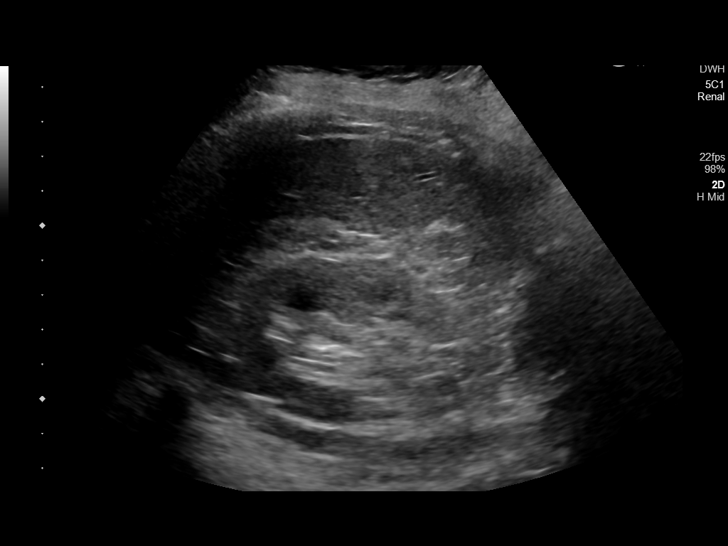
[im 18/47]
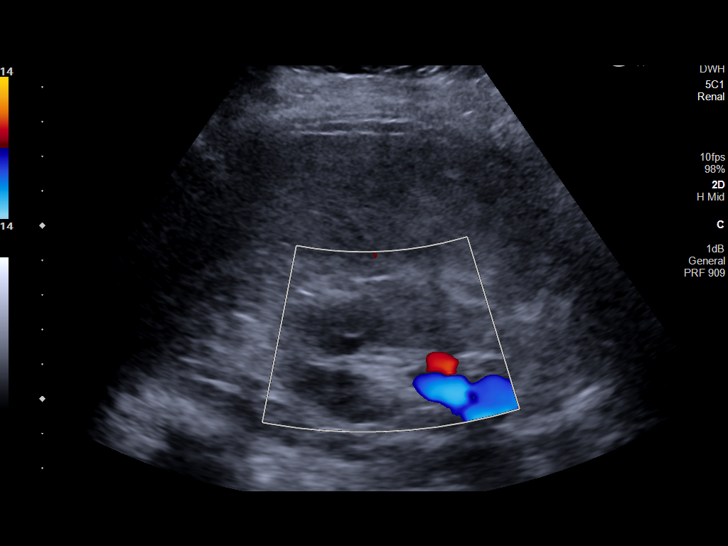
[im 22/47]
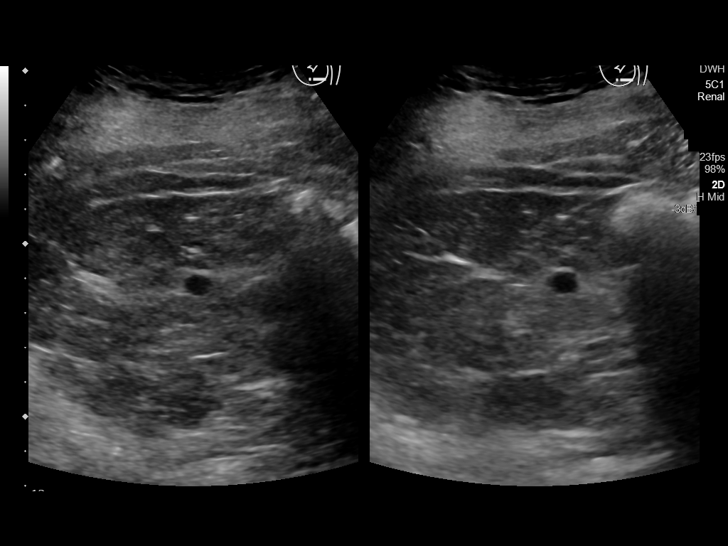
[im 25/47]
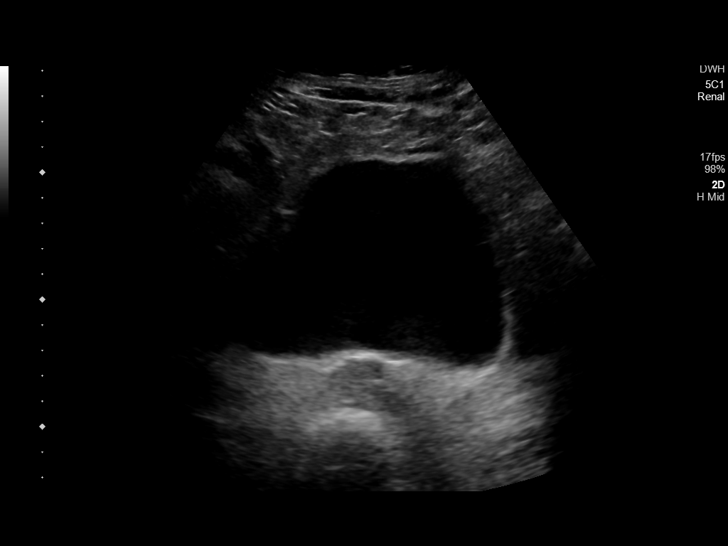
[im 29/47]
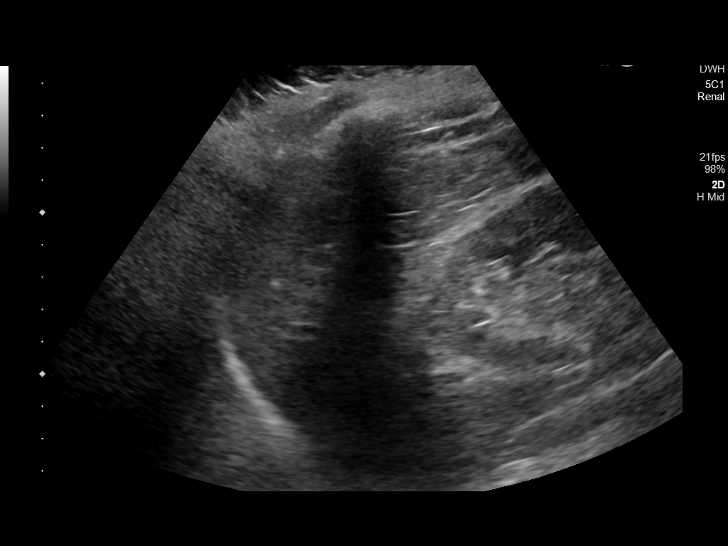
[im 31/47]
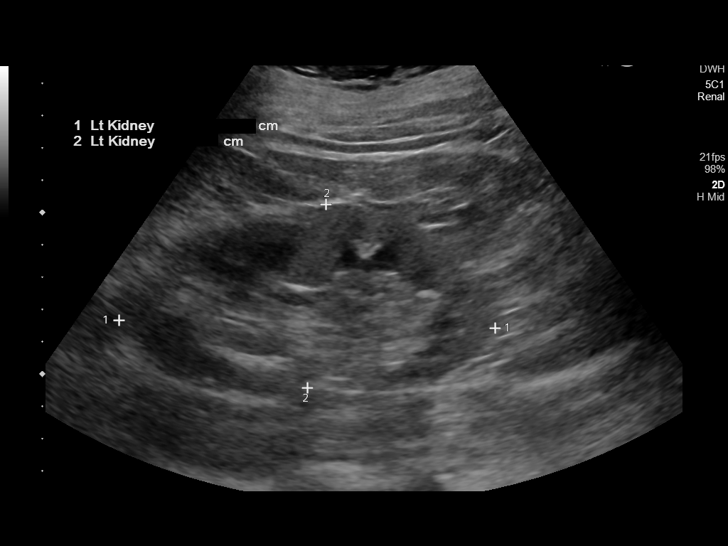
[im 35/47]
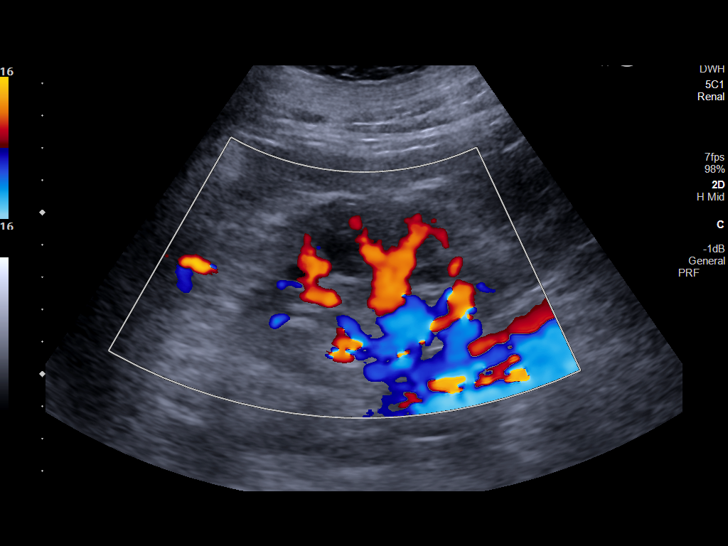
[im 39/47]
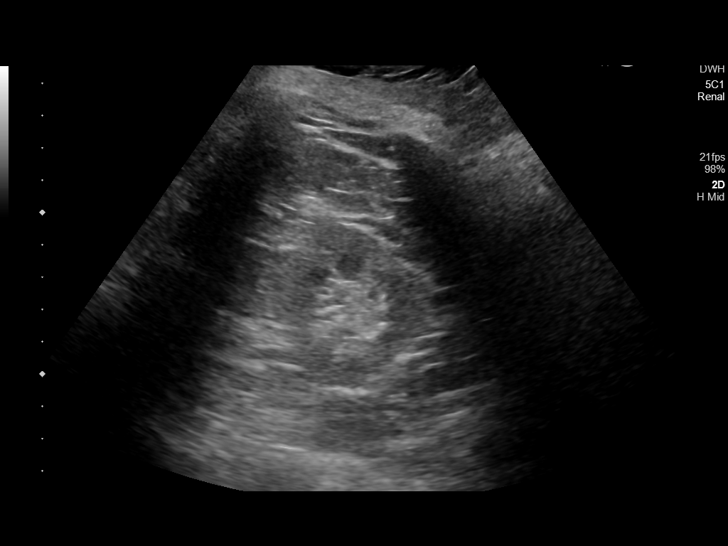
[im 43/47]
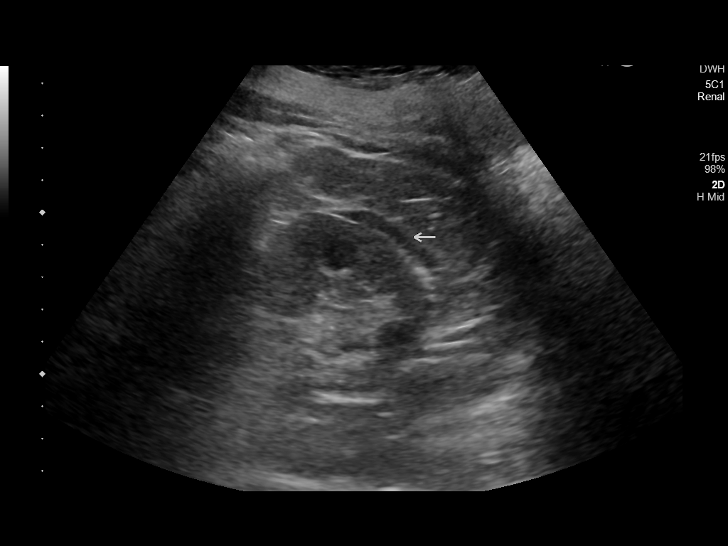
[im 47/47]
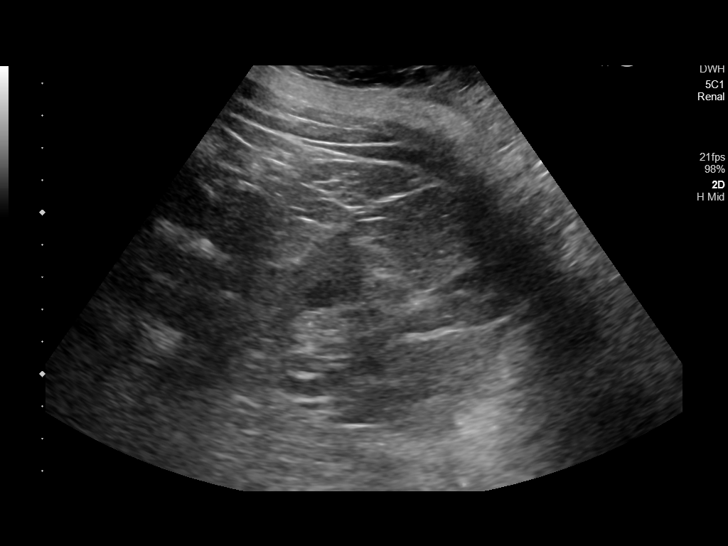

[14 of 25 positions shown; findings below may reference images not displayed]

FINDINGS: Right Kidney:

Renal measurements: 11.8 x 5.1 x 4.8 cm = volume: 149 mL. 9 mm cyst
in the lower pole. Increased echotexture. No hydronephrosis.

Left Kidney:

Renal measurements: 11.7 x 5.7 x 4.3 cm = volume: 148 mL. 2.8 cm
midpole cyst with thin internal septations. Increased echotexture.
No hydronephrosis.

Bladder:

Appears normal for degree of bladder distention.

Other:

None.
IMPRESSION: Increased echotexture compatible with chronic medical renal disease.

No acute findings.  No hydronephrosis.

## 2020-08-12 IMAGING — MR MR MRA HEAD W/O CM
1 series · 20 of 48 positions shown · non-contrast
Comparison: None.

CLINICAL DATA: Stroke follow-up.  Left frontal lobe infarct.

EXAM:
MRA HEAD WITHOUT CONTRAST
TECHNIQUE: Angiographic images of the Circle of Willis were obtained using MRA
technique without intravenous contrast.

[Series 5: TOF · axial · 0.5mm · 0.41mm/px · z∈[-82,+16]mm · 20 of 205 slices shown]
[im 1/205]
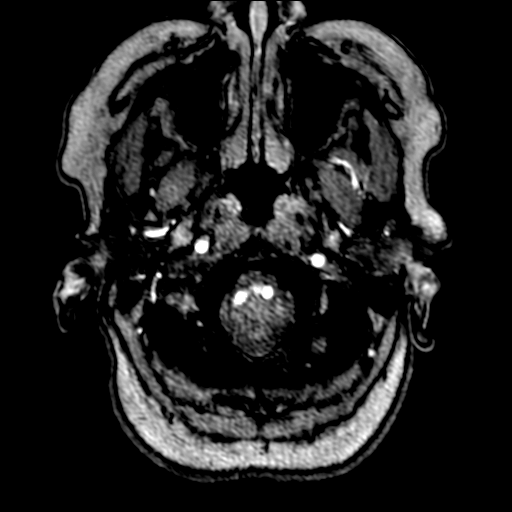
[im 5/205]
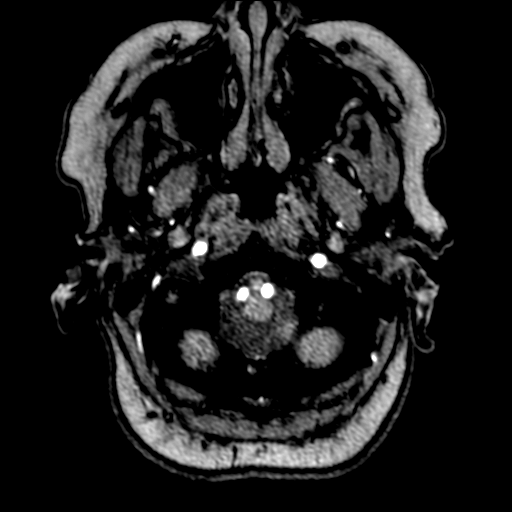
[im 9/205]
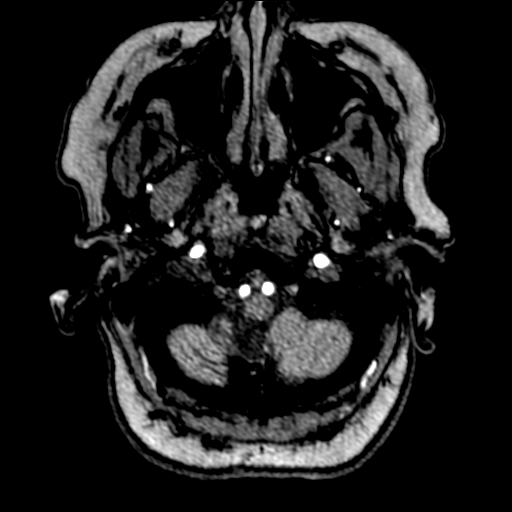
[im 14/205]
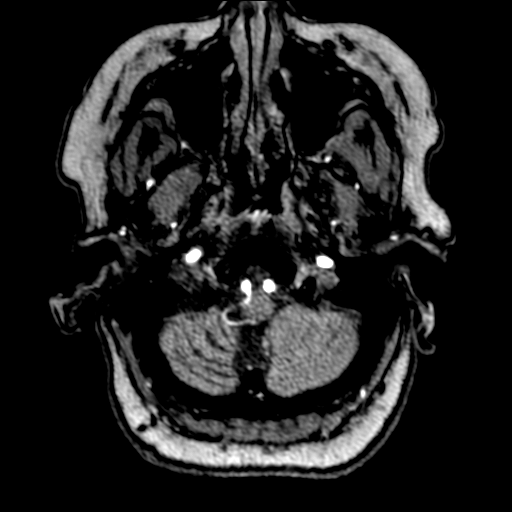
[im 18/205]
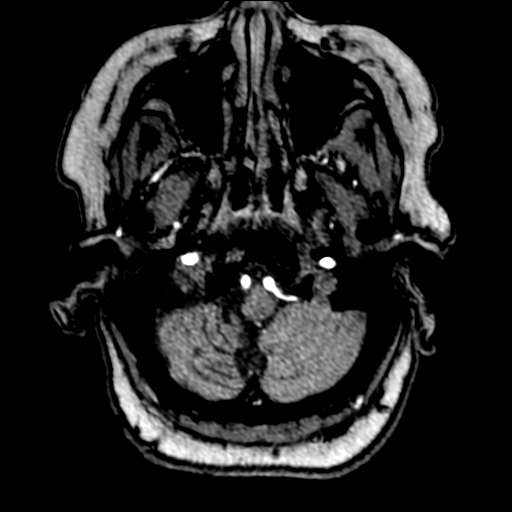
[im 22/205]
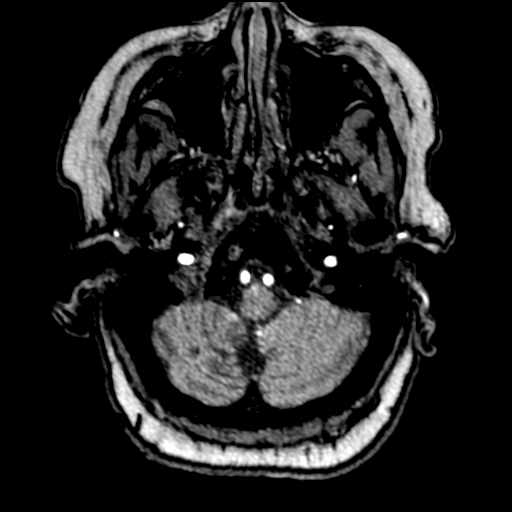
[im 27/205]
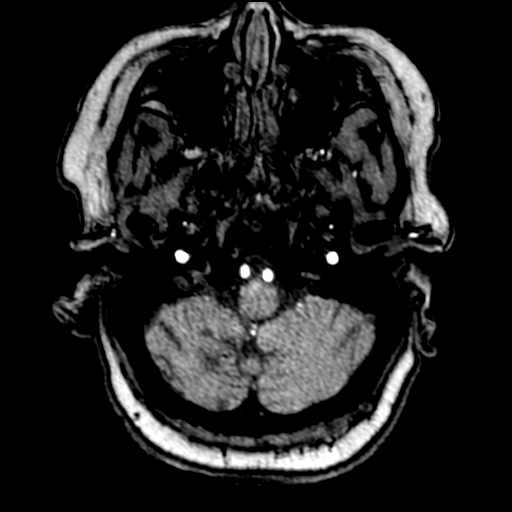
[im 31/205]
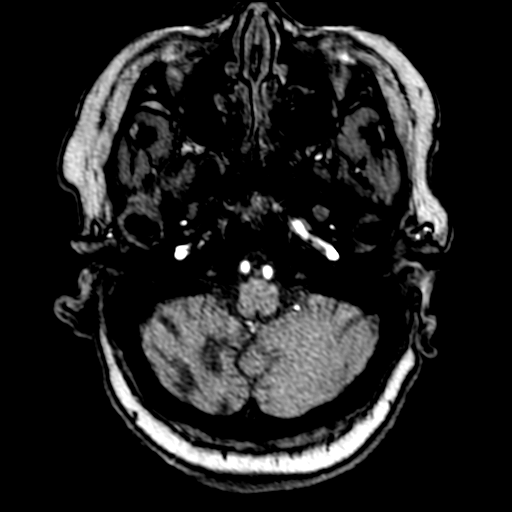
[im 35/205]
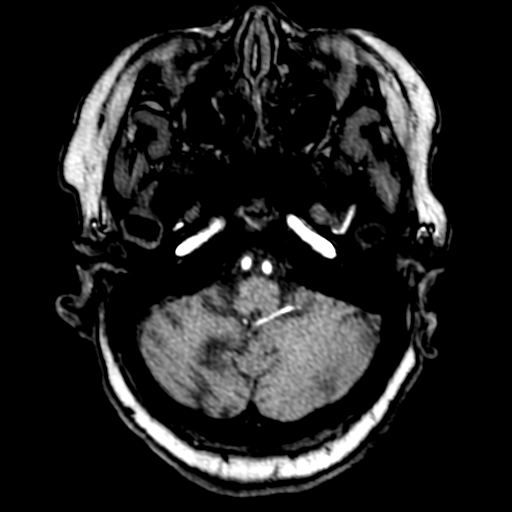
[im 40/205]
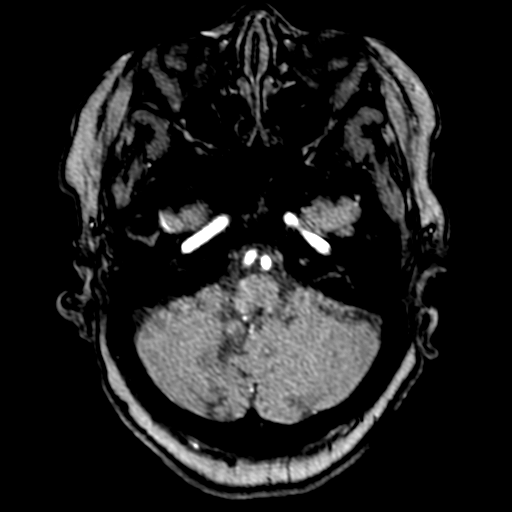
[im 44/205]
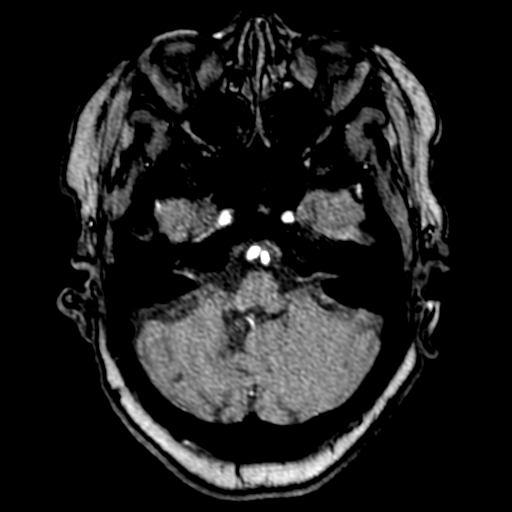
[im 48/205]
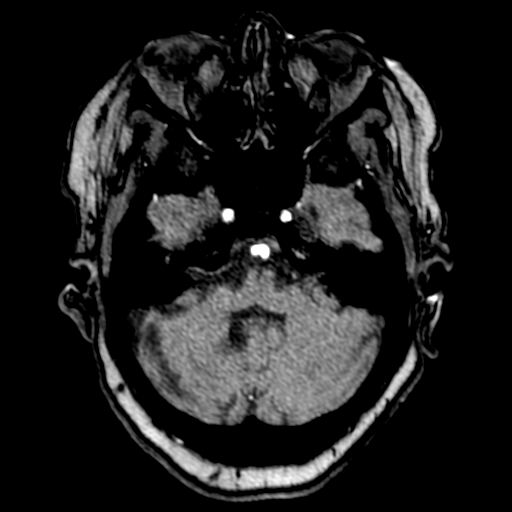
[im 66/205]
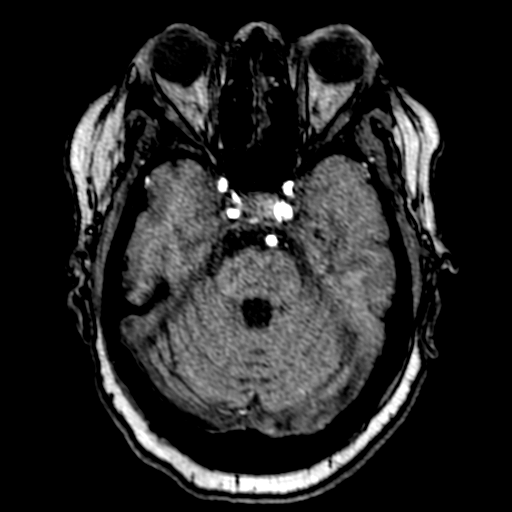
[im 92/205]
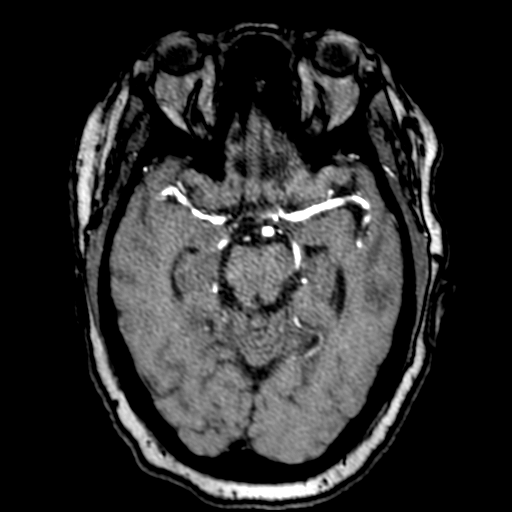
[im 105/205]
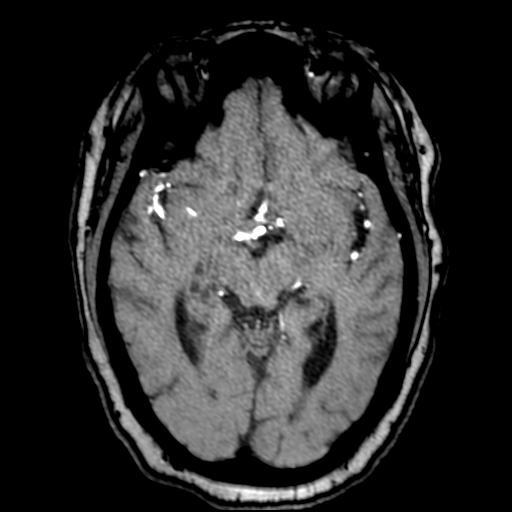
[im 118/205]
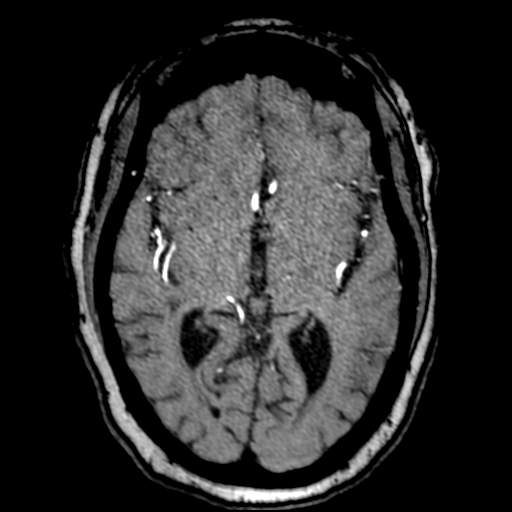
[im 144/205]
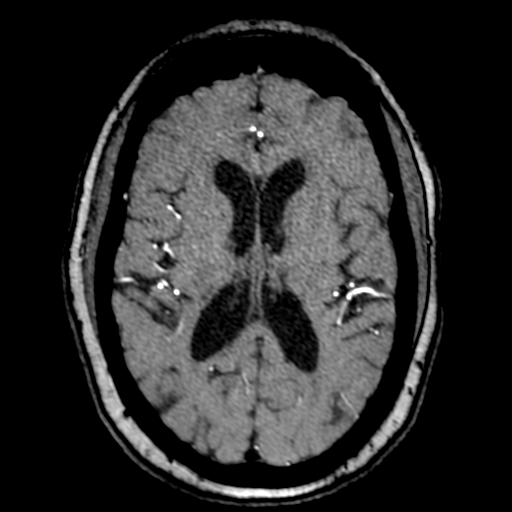
[im 170/205]
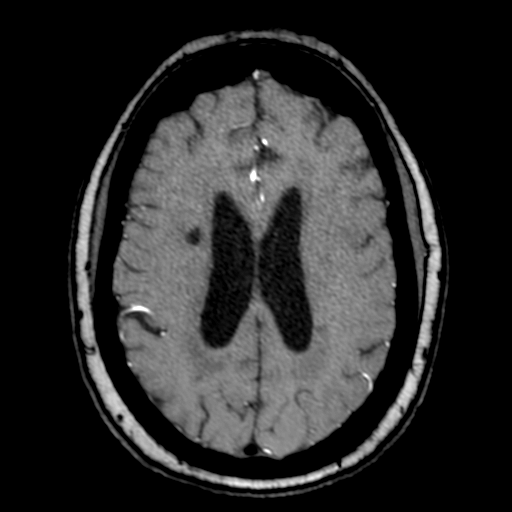
[im 174/205]
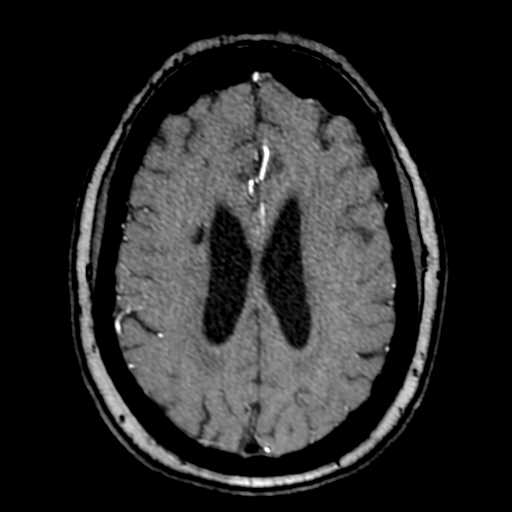
[im 196/205]
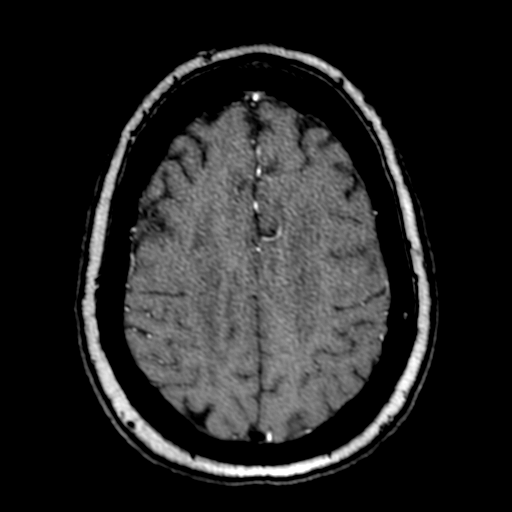

[20 of 48 positions shown; findings below may reference images not displayed]

FINDINGS: POSTERIOR CIRCULATION:

--Vertebral arteries: Normal

--Inferior cerebellar arteries: Normal.

--Basilar artery: Normal.

--Superior cerebellar arteries: Normal.

--Posterior cerebral arteries: Normal.

ANTERIOR CIRCULATION:

--Intracranial internal carotid arteries: Normal.

--Anterior cerebral arteries (ACA): Normal.

--Middle cerebral arteries (MCA): Normal.

ANATOMIC VARIANTS: None
IMPRESSION: 1. Normal intracranial MRA.
2. Multifocal magnetic susceptibility effect corresponding to
predominantly central chronic microhemorrhages, consistent with
chronic hypertensive angiopathy.

## 2020-08-12 IMAGING — CT CT HEAD W/O CM
3 series · 15 of 47 positions shown, 18 images · non-contrast
Comparison: None.

CLINICAL DATA: Altered mental status.

EXAM:
CT HEAD WITHOUT CONTRAST
TECHNIQUE: Contiguous axial images were obtained from the base of the skull
through the vertex without intravenous contrast.

[Series 2: head wo · axial · 0.44mm/px · z∈[-149,-19]mm · 9 of 32 slices shown, 12 images]
[im 3/32  brain]
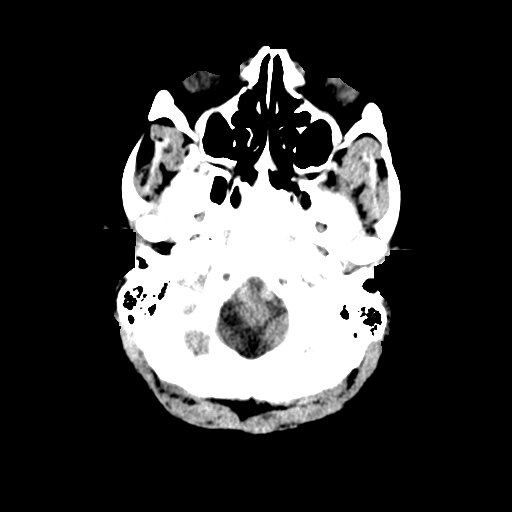
[im 3/32  bone]
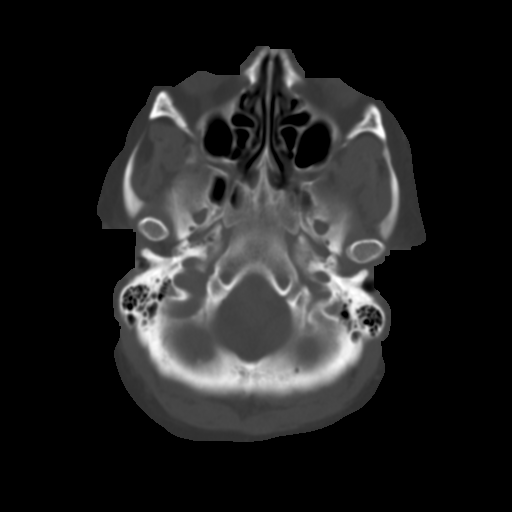
[im 6/32  brain]
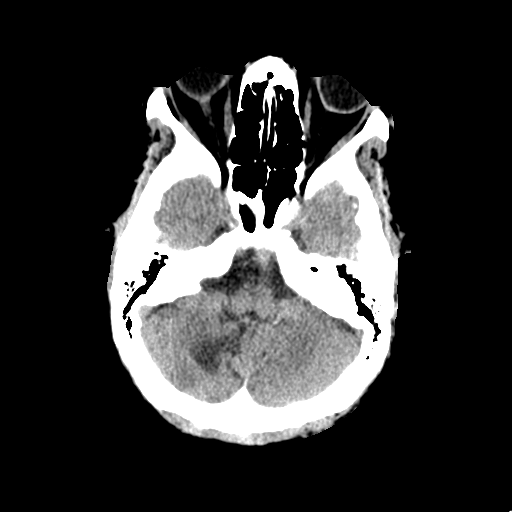
[im 9/32  brain]
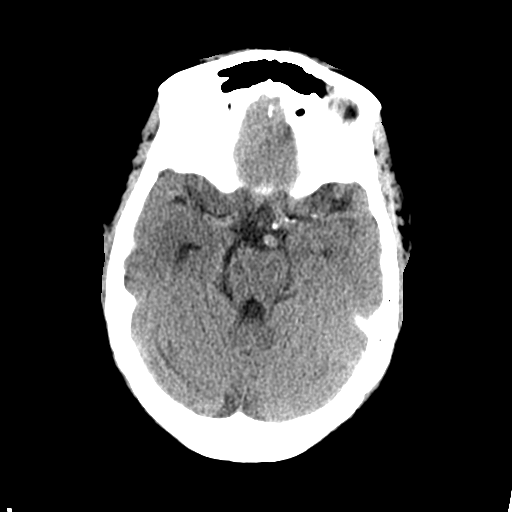
[im 12/32  brain]
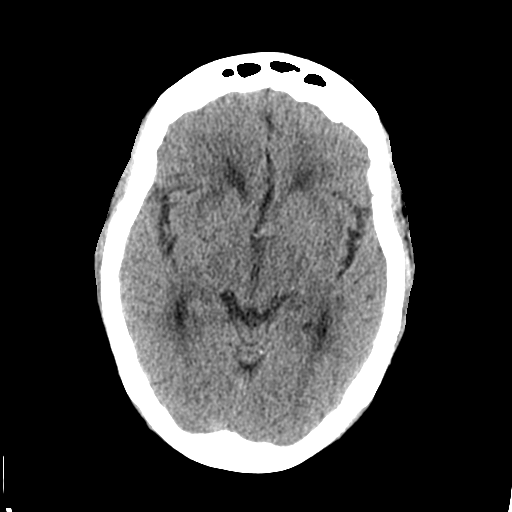
[im 17/32  brain]
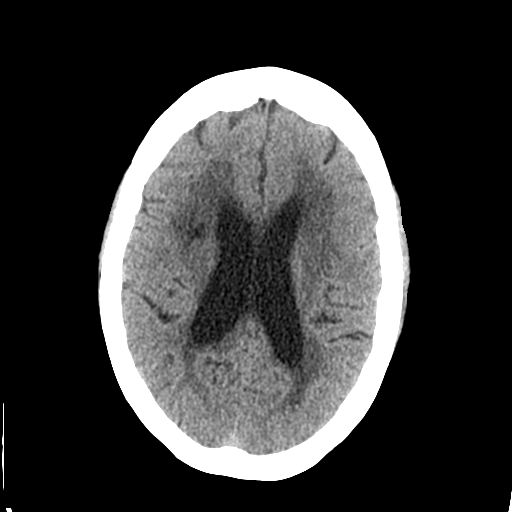
[im 17/32  bone]
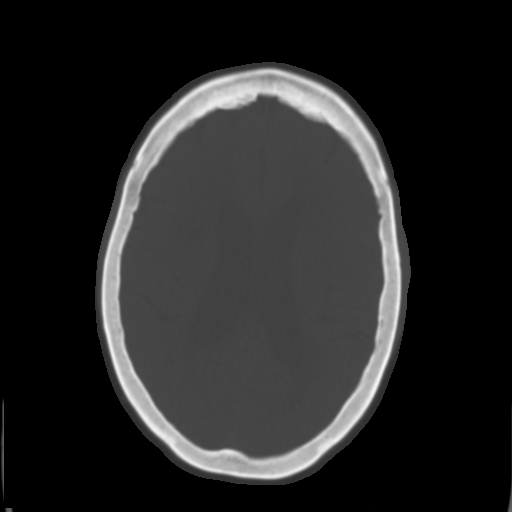
[im 20/32  brain]
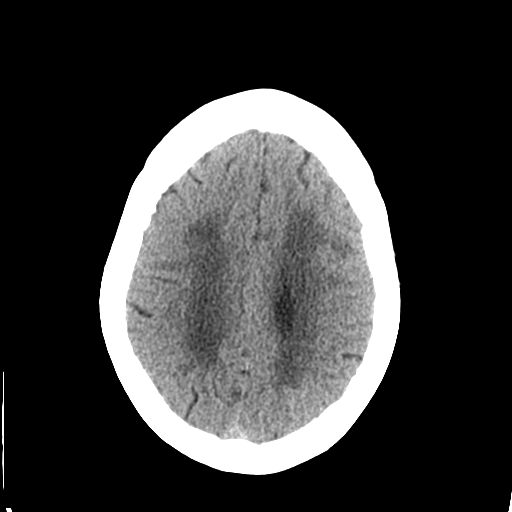
[im 23/32  brain]
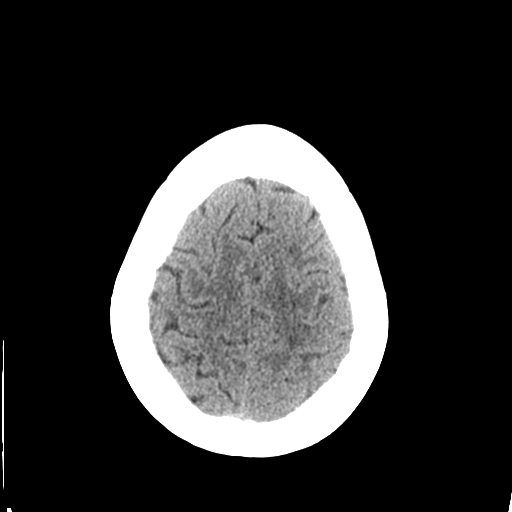
[im 26/32  brain]
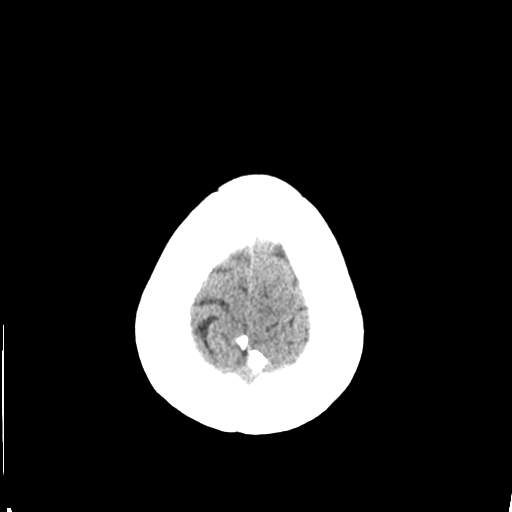
[im 29/32  brain]
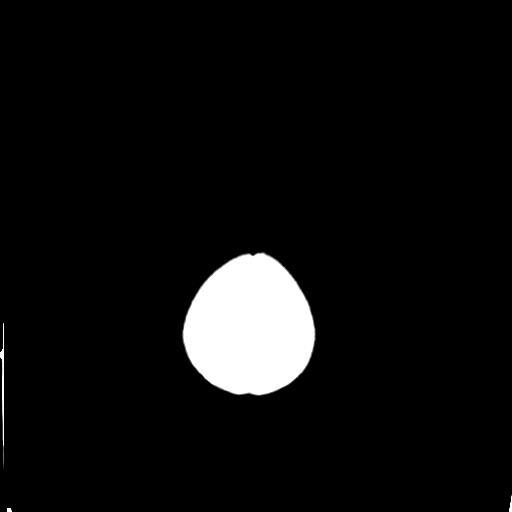
[im 29/32  bone]
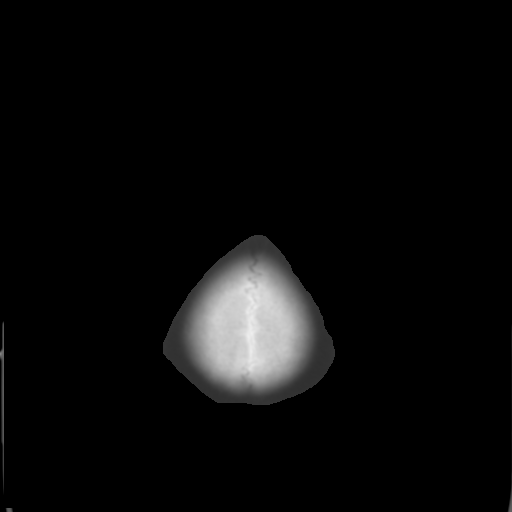

[Series 4: coronal soft tissue · coronal · 0.29mm/px · 3 of 69 slices shown]
[im 23/69  brain]
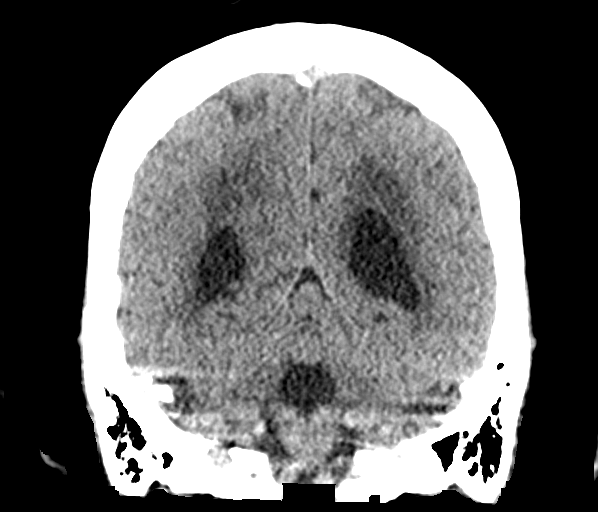
[im 31/69  brain]
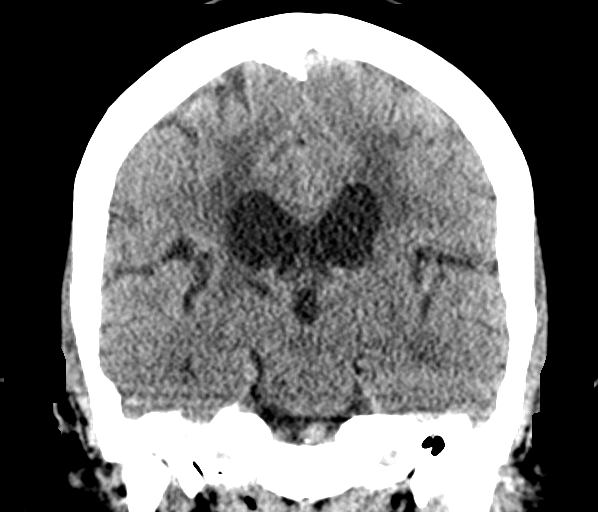
[im 38/69  brain]
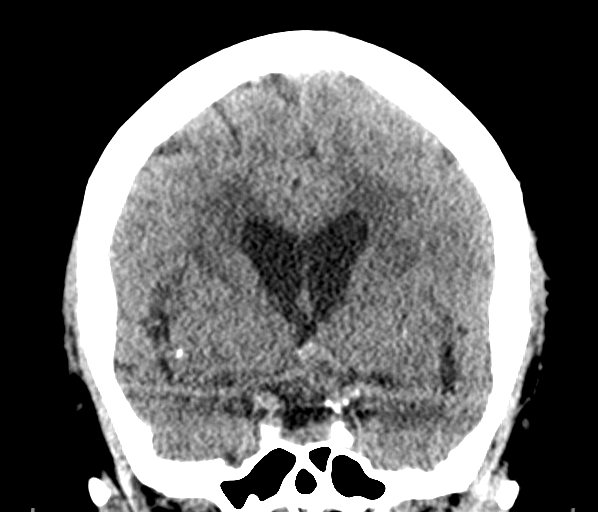

[Series 5: sagittal soft tissue · sagittal · 0.29mm/px · 3 of 57 slices shown]
[im 19/57  brain]
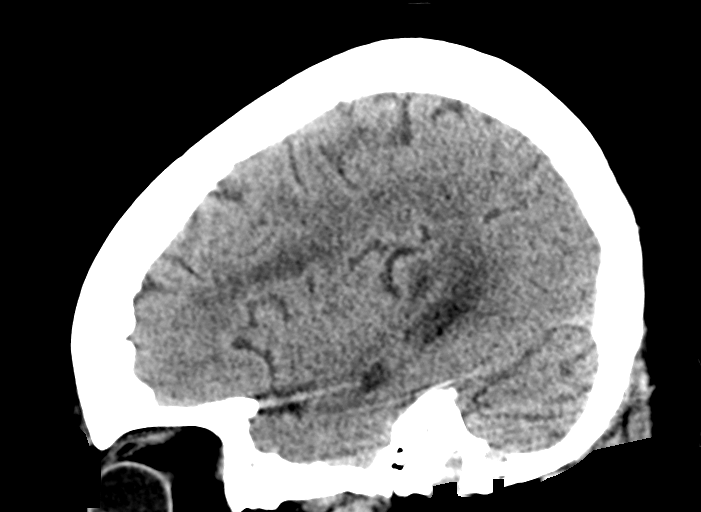
[im 29/57  brain]
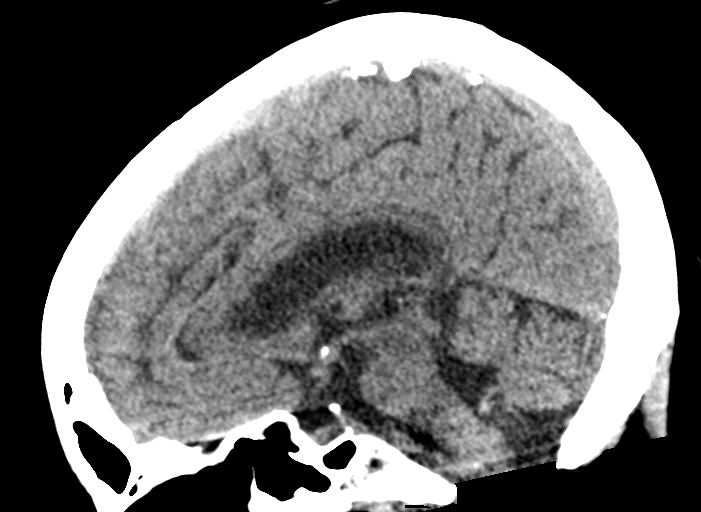
[im 38/57  brain]
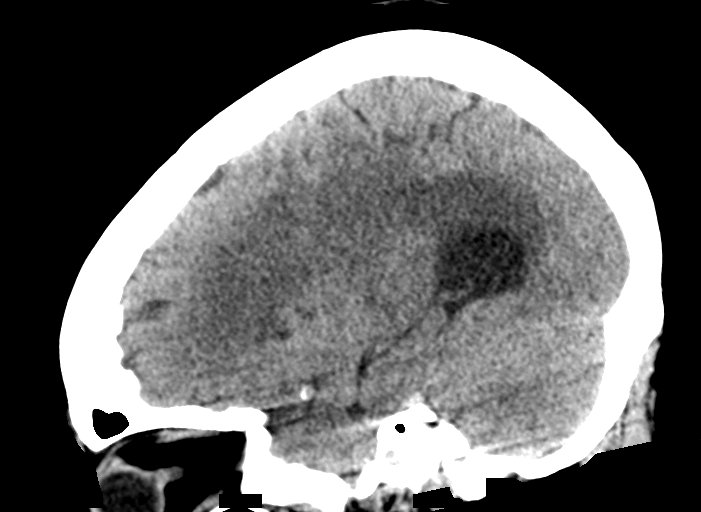

[15 of 47 positions shown; findings below may reference images not displayed]

FINDINGS: Brain: Mild chronic ischemic white matter disease is noted. Old
right cerebellar infarction is noted. No mass effect or midline
shift is noted. Ventricular size is within normal limits. There is
no evidence of mass lesion, hemorrhage or acute infarction.

Vascular: No hyperdense vessel or unexpected calcification.

Skull: Normal. Negative for fracture or focal lesion.

Sinuses/Orbits: No acute finding.

Other: None.
IMPRESSION: Mild chronic ischemic white matter disease. Old right cerebellar
infarction. No acute intracranial abnormality seen.

## 2020-08-12 IMAGING — MR MR HEAD W/O CM
11 series · 41 of 48 positions shown · non-contrast
Comparison: None.
COMPARISON: None.

Addendum:
CLINICAL DATA: Encephalopathy

EXAM:
MRI HEAD WITHOUT CONTRAST
TECHNIQUE: Multiplanar, multiecho pulse sequences of the brain and surrounding
structures were obtained without intravenous contrast.

[Series 13: ax dwi_tracew · axial · 3.0mm · 0.60mm/px · z∈[-129,+32]mm · 4 of 50 slices shown]
[im 1/50]
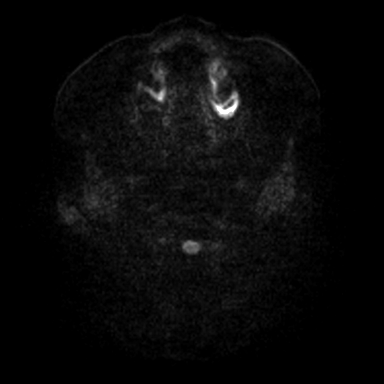
[im 17/50]
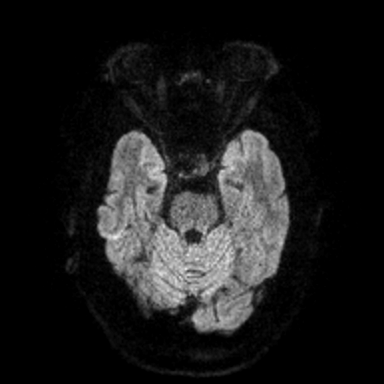
[im 33/50]
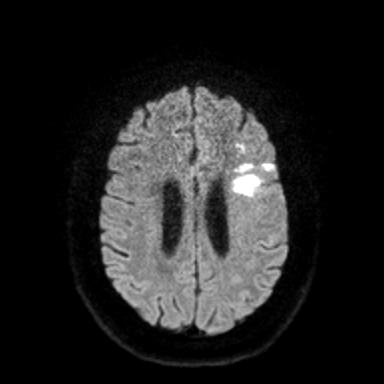
[im 50/50]
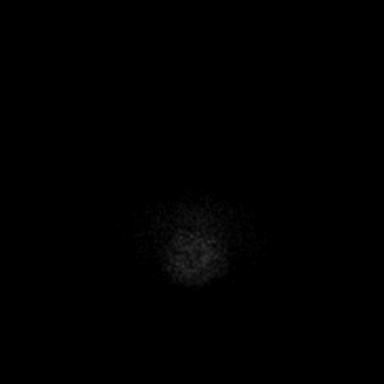

[Series 14: ax dwi_adc · axial · 3.0mm · 0.60mm/px · z∈[-129,+32]mm · 4 of 50 slices shown]
[im 1/50]
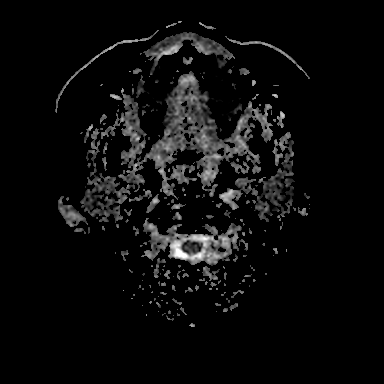
[im 17/50]
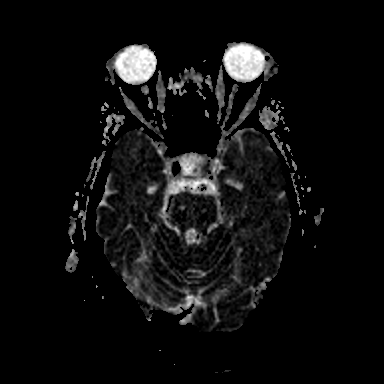
[im 33/50]
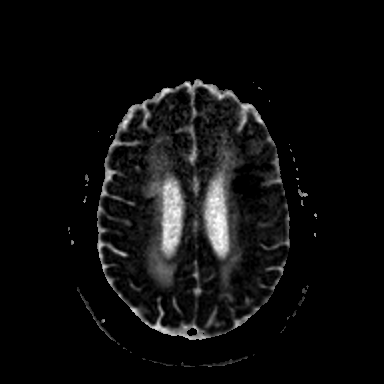
[im 50/50]
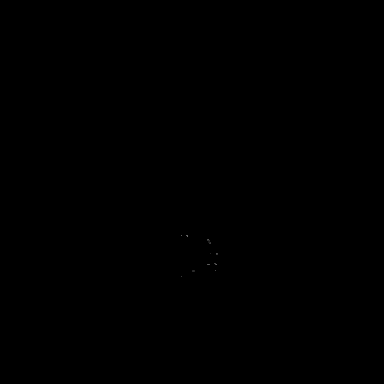

[Series 15: cor dwi_tracew · coronal · 5.0mm · 0.60mm/px · 3 of 40 slices shown]
[im 1/40]
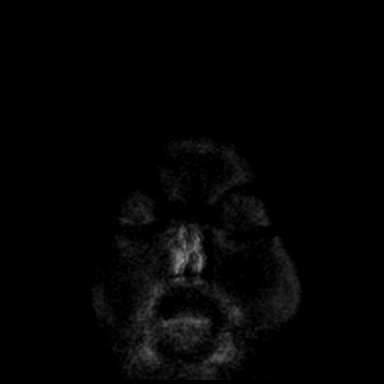
[im 20/40]
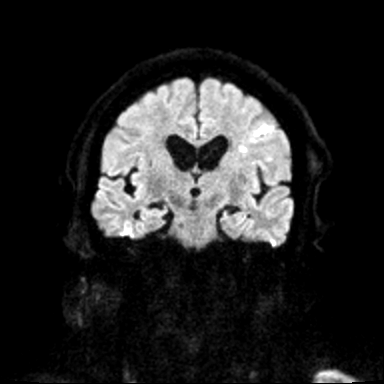
[im 40/40]
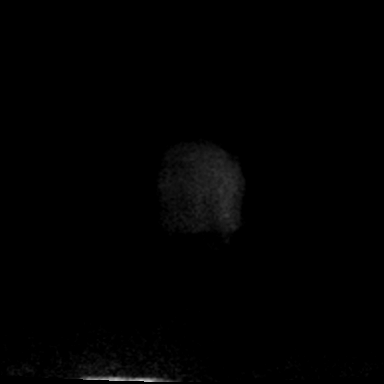

[Series 16: cor dwi_adc · coronal · 5.0mm · 0.60mm/px · 3 of 39 slices shown]
[im 1/39]
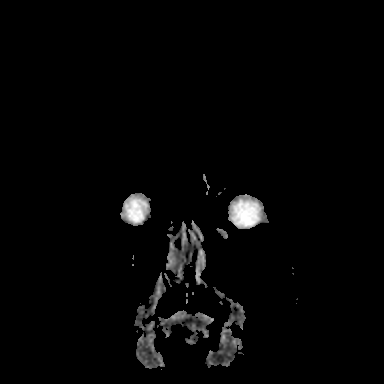
[im 20/39]
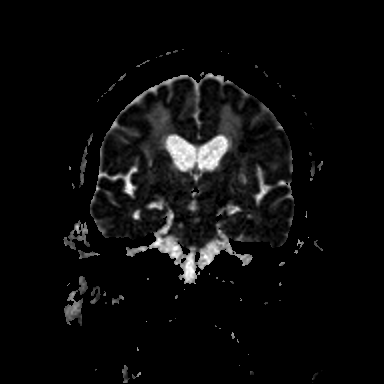
[im 39/39]
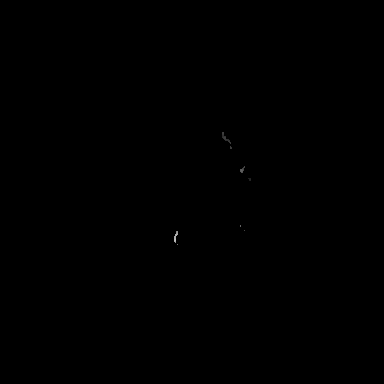

[Series 17: T1 · sagittal · 5.0mm · 0.62mm/px · 2 of 25 slices shown (1 of 2)]
[im 1/25]
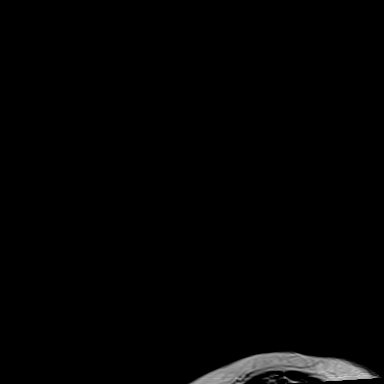
[im 25/25]
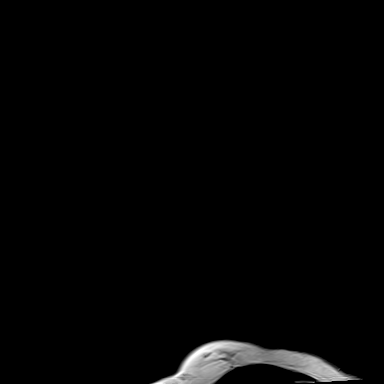

[Series 18: T2 · axial · 5.0mm · 0.53mm/px · z∈[-126,+30]mm · 2 of 27 slices shown (1 of 2)]
[im 1/27]
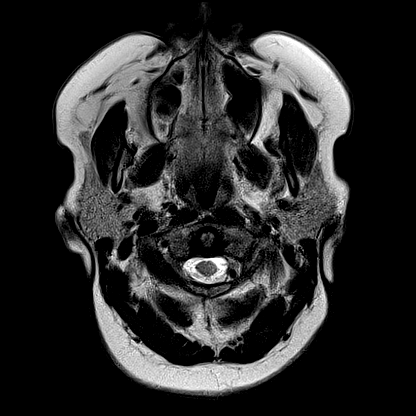
[im 27/27]
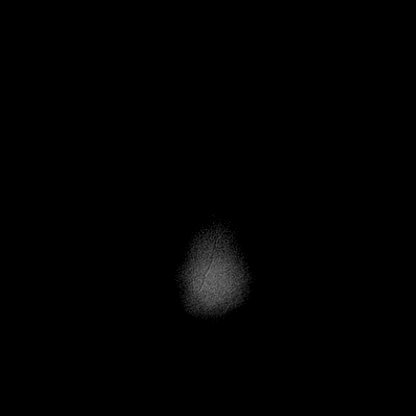

[Series 20: pha_images · axial · 3.0mm · 0.90mm/px · z∈[-137,+40]mm · 5 of 60 slices shown]
[im 1/60]
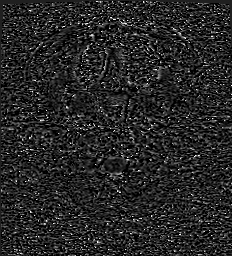
[im 15/60]
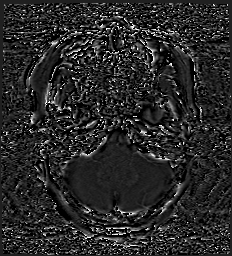
[im 30/60]
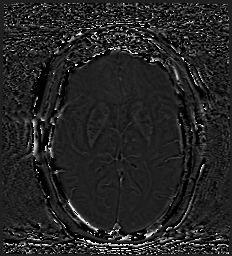
[im 45/60]
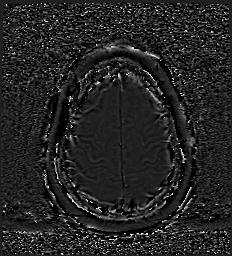
[im 60/60]
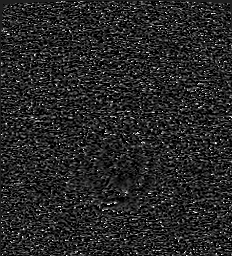

[Series 21: swi_images · axial · 3.0mm · 0.90mm/px · z∈[-137,-5]mm · 4 of 60 slices shown]
[im 1/60]
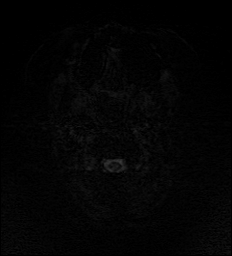
[im 15/60]
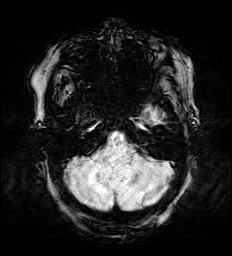
[im 30/60]
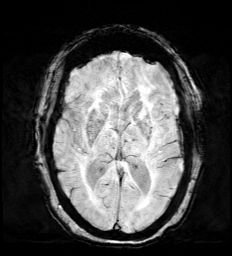
[im 45/60]
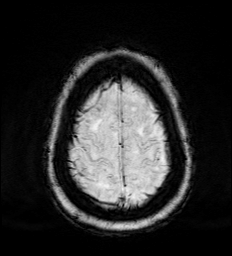

[Series 23: FLAIR · axial · 3.0mm · 0.53mm/px · z∈[-129,+33]mm · 4 of 55 slices shown]
[im 1/55]
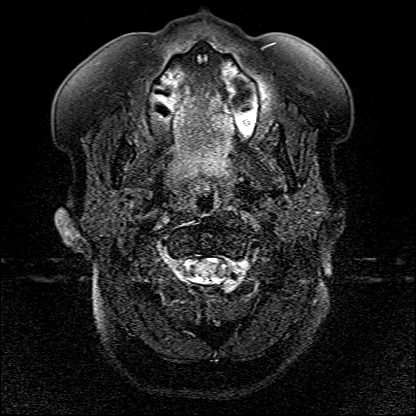
[im 19/55]
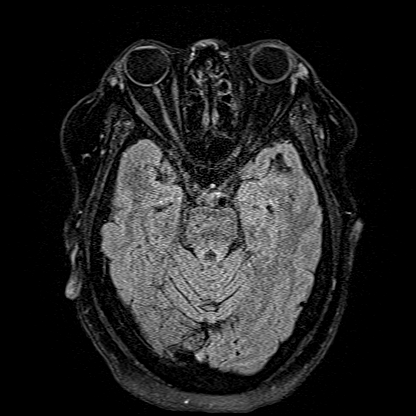
[im 37/55]
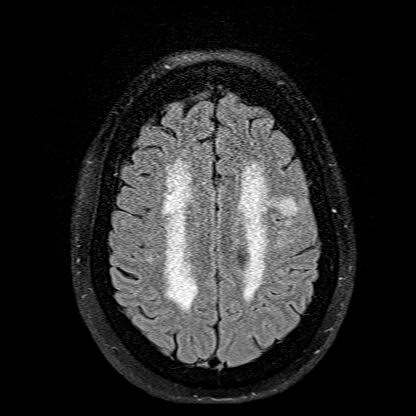
[im 55/55]
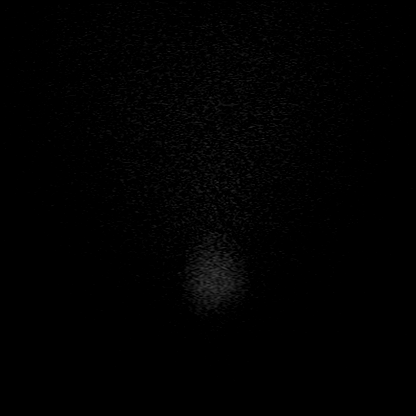

[Series 24: T1 · axial · 1.0mm · 0.98mm/px · z∈[-136,+39]mm · 8 of 176 slices shown (2 of 2)]
[im 1/176]
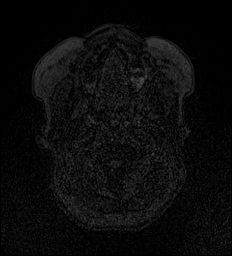
[im 27/176]
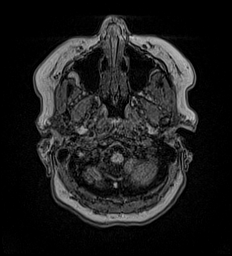
[im 54/176]
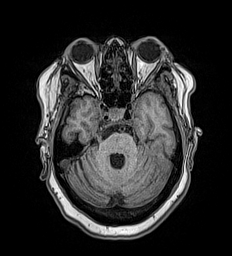
[im 81/176]
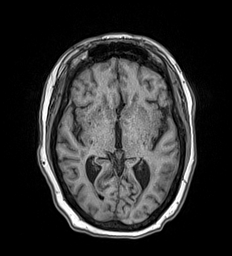
[im 95/176]
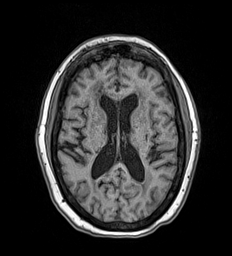
[im 122/176]
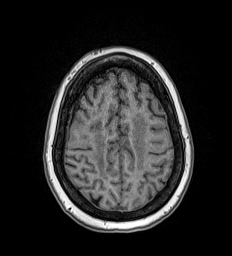
[im 149/176]
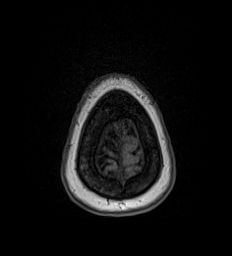
[im 176/176]
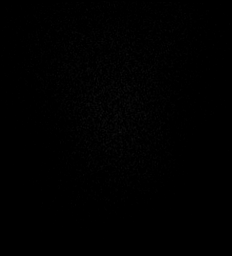

[Series 25: T2 · coronal · 5.0mm · 0.57mm/px · 2 of 30 slices shown (2 of 2)]
[im 1/30]
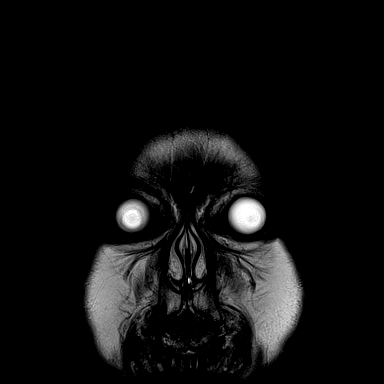
[im 30/30]
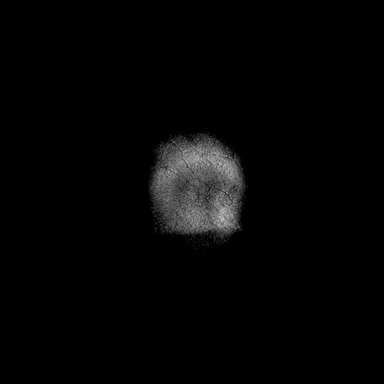

[41 of 48 positions shown; findings below may reference images not displayed]

FINDINGS: Brain: Multifocal abnormal diffusion restriction within the left
frontal lobe and insula. There is an old right cerebellar infarct.
No acute or chronic hemorrhage. Hyperintense T2-weighted signal is
moderately widespread throughout the white matter. Generalized
volume loss without a clear lobar predilection. The midline
structures are normal.

Vascular: Major flow voids are preserved.

Skull and upper cervical spine: Normal calvarium and skull base.
Visualized upper cervical spine and soft tissues are normal.

Sinuses/Orbits:No paranasal sinus fluid levels or advanced mucosal
thickening. No mastoid or middle ear effusion. Normal orbits.
IMPRESSION: 1. Multifocal acute ischemia within the left frontal lobe and
insula. No hemorrhage or mass effect.
2. Old right cerebellar infarct and findings of chronic
microvascular disease.

ADDENDUM:
There are approximately 15 scattered foci of chronic
microhemorrhage, predominantly central in distribution, most
consistent with chronic hypertensive angiopathy.

*** End of Addendum ***
FINDINGS: Brain: Multifocal abnormal diffusion restriction within the left
frontal lobe and insula. There is an old right cerebellar infarct.
No acute or chronic hemorrhage. Hyperintense T2-weighted signal is
moderately widespread throughout the white matter. Generalized
volume loss without a clear lobar predilection. The midline
structures are normal.

Vascular: Major flow voids are preserved.

Skull and upper cervical spine: Normal calvarium and skull base.
Visualized upper cervical spine and soft tissues are normal.

Sinuses/Orbits:No paranasal sinus fluid levels or advanced mucosal
thickening. No mastoid or middle ear effusion. Normal orbits.
IMPRESSION: 1. Multifocal acute ischemia within the left frontal lobe and
insula. No hemorrhage or mass effect.
2. Old right cerebellar infarct and findings of chronic
microvascular disease.

## 2020-08-12 MED ORDER — THIAMINE HCL 100 MG/ML IJ SOLN
500.0000 mg | Freq: Three times a day (TID) | INTRAVENOUS | Status: AC
  Administered 2020-08-13 – 2020-08-14 (×7): 500 mg via INTRAVENOUS
  Filled 2020-08-12 (×9): qty 5

## 2020-08-12 MED ORDER — ONDANSETRON HCL 4 MG PO TABS: 4.0000 mg | ORAL_TABLET | Freq: Four times a day (QID) | ORAL | Status: DC | PRN

## 2020-08-12 MED ORDER — ENOXAPARIN SODIUM 30 MG/0.3ML ~~LOC~~ SOLN: 30.0000 mg | SUBCUTANEOUS | Status: DC

## 2020-08-12 MED ORDER — ACETAMINOPHEN 650 MG RE SUPP: 325.0000 mg | Freq: Four times a day (QID) | RECTAL | Status: DC | PRN

## 2020-08-12 MED ORDER — CARVEDILOL 3.125 MG PO TABS
6.2500 mg | ORAL_TABLET | Freq: Two times a day (BID) | ORAL | Status: DC
  Administered 2020-08-13 – 2020-08-16 (×7): 6.25 mg via ORAL
  Filled 2020-08-12 (×3): qty 2
  Filled 2020-08-12: qty 1
  Filled 2020-08-12 (×3): qty 2

## 2020-08-12 MED ORDER — HYDRALAZINE HCL 25 MG PO TABS
25.0000 mg | ORAL_TABLET | Freq: Two times a day (BID) | ORAL | Status: DC
  Administered 2020-08-13 – 2020-08-18 (×12): 25 mg via ORAL
  Filled 2020-08-12 (×12): qty 1

## 2020-08-12 MED ORDER — ASPIRIN EC 81 MG PO TBEC
81.0000 mg | DELAYED_RELEASE_TABLET | Freq: Every day | ORAL | Status: DC
  Administered 2020-08-13 – 2020-08-18 (×7): 81 mg via ORAL
  Filled 2020-08-12 (×7): qty 1

## 2020-08-12 MED ORDER — LACTATED RINGERS IV SOLN: INTRAVENOUS | Status: DC

## 2020-08-12 MED ORDER — ALBUTEROL SULFATE HFA 108 (90 BASE) MCG/ACT IN AERS
1.0000 | INHALATION_SPRAY | Freq: Four times a day (QID) | RESPIRATORY_TRACT | Status: DC | PRN
  Filled 2020-08-12: qty 6.7

## 2020-08-12 MED ORDER — CLONIDINE HCL 0.1 MG PO TABS
0.2000 mg | ORAL_TABLET | Freq: Every day | ORAL | Status: DC
  Administered 2020-08-13 – 2020-08-18 (×7): 0.2 mg via ORAL
  Filled 2020-08-12 (×7): qty 2

## 2020-08-12 MED ORDER — CARVEDILOL 6.25 MG PO TABS: 6.2500 mg | ORAL_TABLET | Freq: Two times a day (BID) | ORAL | Status: DC

## 2020-08-12 MED ORDER — NITROGLYCERIN 0.4 MG SL SUBL: 0.4000 mg | SUBLINGUAL_TABLET | SUBLINGUAL | Status: DC | PRN

## 2020-08-12 MED ORDER — ACETAMINOPHEN 325 MG PO TABS: 650.0000 mg | ORAL_TABLET | Freq: Four times a day (QID) | ORAL | Status: DC | PRN

## 2020-08-12 MED ORDER — ONDANSETRON HCL 4 MG/2ML IJ SOLN: 4.0000 mg | Freq: Four times a day (QID) | INTRAMUSCULAR | Status: DC | PRN

## 2020-08-12 MED ORDER — ATORVASTATIN CALCIUM 20 MG PO TABS
40.0000 mg | ORAL_TABLET | Freq: Every day | ORAL | Status: DC
  Administered 2020-08-14 – 2020-08-17 (×3): 40 mg via ORAL
  Filled 2020-08-12 (×4): qty 2

## 2020-08-12 NOTE — H&P (Signed)
History and Physical   Patricia Ewing [id number] DOB: Nov 03, 1950 DOA: 08/12/2020  PCP: Baxter Hire, MD  Outpatient Specialists: Ophthalmology, Dr. Neville Route Patient coming from: home   I have personally briefly reviewed patient's old medical records in [place].  Chief Concern: confusion  HPI: Patricia Ewing is a 70 y.o. female with medical history significant for cataracts status post cataract extraction and left, coronary artery disease, pretension, atherosclerosis of abdominal aorta, history of NSTEMI, hyperlipidemia, atrial fibrillation, presented to the emergency department for chief concerns of confusion.  At bedside, she is moving all all extremities and able to follow commands. She is able to tell me her full name, however when asked her age, she keeps repeating 31.  She is alert to self and states she is in hospital. She states the current calendar year is 2029 and the current president is Theodis Blaze.  She is able to identify her spouse at bedside and states that his name is [name].  Spouse at bedside denies syncope and/or loss of consciousness, trauma, falling.  He endorses her having difficulty walking.  Social history: Lives with spouse and patient is retired.  Denies tobacco use, EtOH, recreational drug use.  ROS:  Constitutional: no weight change, no fever ENT/Mouth: no sore throat, no rhinorrhea Eyes: no eye pain, no vision changes Cardiovascular: no chest pain, no dyspnea,  no edema, no palpitations Respiratory: no cough, no sputum, no wheezing Gastrointestinal: no nausea, no vomiting, no diarrhea, no constipation Genitourinary: no urinary incontinence, no dysuria, no hematuria Musculoskeletal: no arthralgias, no myalgias Skin: no skin lesions, no pruritus, Neuro: + weakness, no loss of consciousness, no syncope Psych: no anxiety, no depression, + decrease appetite Heme/Lymph: no bruising, no bleeding  ED Course: Discussed with ED  provider, patient requiring hospitalization due to altered mentation and old stroke.  Altered mentation is new for the patient and per spouse at bedside.  In the ED were reassuring, afebrile with temperature of 99.2 oral, respiration rate 18, heart rate 106, blood pressure 139/97, SPO2 96% on room air.  Assessment/Plan  Principal Problem:   Altered mental status Active Problems:   HTN (hypertension)   Obesity (BMI 30-39.9)   Confusion/altered mental status for 2 weeks-etiology unclear during admission - CT the head without contrast showed mild chronic ischemic white matter disease.  Old right cerebellar infarct.  No acute intracranial abnormality. -Thyroid panel with TSH, RPR, anemia panel - Neurology was consulted and recommend MRI, EEG, stat thiamine level - MRI neurology ordered showed multifocal acute ischemia within the left frontal lobe and insula.  No hemorrhage or mass effect. - Echo ordered - Checking BNP and troponin -Neurochecks every 2 hours -Appreciate neurology recommendations  Elevated troponin x1, will follow  Hypertension- resumed home antihypertensives, carvedilol 6.5 mg p.o. twice daily, clonidine 0.2 milligrams p.o. daily, hydralazine 25 mg p.o. twice daily  Hyperlipidemia- atorvastatin 40 mg daily  Acute kidney injury- - Renal ultrasound - LR 125 cc/h for 10 hours - BMP in the a.m.  Chart reviewed.   DVT prophylaxis: Holding due to acute stroke, TED hose Code Status: full code Diet: heart healthy Family Communication: updated spouse at bedside Disposition Plan: pending clinical course Consults called: neurology Admission status: observation   Past Medical History:  Diagnosis Date  . A-fib ([place])   . Bronchitis    chronic, mostly from fragrances, non since retirement  . Glaucoma   . Hyperlipidemia    managed with lifestyle modification  . Hypertension  controlled on meds  . Pneumonia    in past  . Tobacco abuse    No PFT's or diagnosis of  COPD  . Wears partial dentures    upper   Past Surgical History:  Procedure Laterality Date  . ABDOMINAL HYSTERECTOMY  1984  . BREAST CYST EXCISION     abcess drainage  . CATARACT EXTRACTION W/PHACO Left 12/18/2019   Procedure: CATARACT EXTRACTION PHACO AND INTRAOCULAR LENS PLACEMENT (IOC) LEFT VISION BLUE 3.43  00:22.5;  Surgeon: Marchia Meiers, MD;  Location: [place];  Service: Ophthalmology;  Laterality: Left;  . CATARACT EXTRACTION W/PHACO Right 01/15/2020   Procedure: CATARACT EXTRACTION PHACO AND INTRAOCULAR LENS PLACEMENT (IOC) RIGHT  6.29  00:39.6;  Surgeon: Marchia Meiers, MD;  Location: [place];  Service: Ophthalmology;  Laterality: Right;  . COLONOSCOPY WITH PROPOFOL    . COLONOSCOPY WITH PROPOFOL N/A 09/11/2018   Procedure: COLONOSCOPY WITH PROPOFOL;  Surgeon: Manya Silvas, MD;  Location: [facility] ENDOSCOPY;  Service: Endoscopy;  Laterality: N/A;   Social History:  reports that she has been smoking cigarettes. She has a 10.00 pack-year smoking history. She has never used smokeless tobacco. She reports current alcohol use of about 1.0 - 2.0 standard drink of alcohol per week. She reports that she does not use drugs.  Allergies  Allergen Reactions  . Erythromycin Diarrhea and Nausea Only  . Contrast Media [Iodinated Diagnostic Agents] Other (See Comments)    Flushed, cough, anxiety, mild less responsiveness.  . Metrizamide    Family History  Problem Relation Age of Onset  . Heart attack Father 37  . Lymphoma Son   . Colon cancer Other   . Breast cancer Neg Hx    Family history: Family history reviewed and not pertinent  Prior to Admission medications   Medication Sig Start Date End Date Taking? Authorizing Provider  albuterol (PROVENTIL HFA;VENTOLIN HFA) 108 (90 BASE) MCG/ACT inhaler Inhale 1 puff into the lungs every 6 (six) hours as needed. Shortness of breath    [provider]  aspirin 81 MG tablet Take 81 mg by mouth daily.     [provider]  atorvastatin (LIPITOR) 40 MG tablet Take 1 tablet (40 mg total) by mouth daily at 6 PM. 12/06/15   Demetrios Loll, MD  brimonidine [facility]) 0.2 % ophthalmic solution Place 1 drop into both eyes 2 (two) times daily.    [provider]  carvedilol (COREG) 6.25 MG tablet Take 6.25 mg by mouth 2 (two) times daily with a meal.    [provider]  cetirizine (ZYRTEC) 10 MG tablet Take 10 mg by mouth daily as needed. allergies    [provider]  cloNIDine (CATAPRES) 0.2 MG tablet Take 0.2 mg by mouth daily.    [provider]  dorzolamide (TRUSOPT) 2 % ophthalmic solution 1 drop 3 (three) times daily.    [provider]  furosemide (LASIX) 40 MG tablet Take 40 mg by mouth 2 (two) times daily.    [provider]  hydrALAZINE (APRESOLINE) 25 MG tablet Take 25 mg by mouth in the morning and at bedtime.    [provider]  latanoprost (XALATAN) 0.005 % ophthalmic solution Place 1 drop into both eyes.    [provider]  loratadine (CLARITIN) 10 MG tablet Take 10 mg by mouth daily.    [provider]  metoprolol (LOPRESSOR) 50 MG tablet Take 1 tablet (50 mg total) by mouth 2 (two) times daily. Patient not taking: Reported  on 01/05/2020 12/06/15   Demetrios Loll, MD  nitroGLYCERIN (NITROSTAT) 0.4 MG SL tablet Place 1 tablet (0.4 mg total) under the tongue every 5 (five) minutes as needed for chest pain. 12/06/15   Demetrios Loll, MD  polyethylene glycol [facility] / Floria Raveling) packet Take 17 g by mouth daily. Patient not taking: Reported on 12/10/2019 12/06/15   Demetrios Loll, MD  potassium chloride SA (K-DUR,KLOR-CON) 20 MEQ tablet Take 20 mEq by mouth 2 (two) times daily.    [provider]  timolol (BETIMOL) 0.5 % ophthalmic solution 1 drop 2 (two) times daily.    [provider]    Physical Exam: Vitals:   08/12/20 1400 08/12/20 1500 08/12/20 1530 08/12/20 1630  BP: (!) 173/89 (!) 173/83 (!) 190/92 111/88   Pulse: 75 77 75 73  Resp: 18 18 16 16   Temp:      TempSrc:      SpO2: 99% 98% 98% 99%  Weight:      Height:       Constitutional: appears age-appropriate, NAD, calm, comfortable Eyes: PERRL, lids and conjunctivae normal ENMT: Mucous membranes are moist. Posterior pharynx clear of any exudate or lesions. Age-appropriate dentition. Hearing appropriate Neck: normal, supple, no masses, no thyromegaly Respiratory: clear to auscultation bilaterally, no wheezing, no crackles. Normal respiratory effort. No accessory muscle use.  Cardiovascular: Regular rate and rhythm, no murmurs / rubs / gallops. No extremity edema. 2+ pedal pulses. No carotid bruits.  Abdomen: Obese abdomen, no tenderness, no masses palpated, no hepatosplenomegaly. Bowel sounds positive.  Musculoskeletal: no clubbing / cyanosis. No joint deformity upper and lower extremities. Good ROM, no contractures, no atrophy. Normal muscle tone.  Skin: no rashes, lesions, ulcers. No induration Neurologic: Sensation intact. Strength 5/5 in all 4.  Psychiatric: Alert and oriented to self and current location.  Patient is not oriented to her age, current year.  Pleasant mood.   EKG: independently reviewed, showing sinus tachycardia, rate of 101, QTc 453  Chest x-ray on Admission: I personally reviewed and I agree with radiologist reading as below.  DG Chest 2 View  Result Date: 08/12/2020 CLINICAL DATA:  Weakness. EXAM: CHEST - 2 VIEW COMPARISON:  12/01/2015 FINDINGS: Stable cardiac enlargement. No pleural effusion or edema. No airspace densities identified. Visualized osseous structures are unremarkable. IMPRESSION: No acute cardiopulmonary abnormalities. Electronically Signed   By: Kerby Moors M.D.   On: 08/12/2020 13:41   CT Head Wo Contrast  Result Date: 08/12/2020 CLINICAL DATA:  Altered mental status. EXAM: CT HEAD WITHOUT CONTRAST TECHNIQUE: Contiguous axial images were obtained from the base of the skull through the vertex  without intravenous contrast. COMPARISON:  None. FINDINGS: Brain: Mild chronic ischemic white matter disease is noted. Old right cerebellar infarction is noted. No mass effect or midline shift is noted. Ventricular size is within normal limits. There is no evidence of mass lesion, hemorrhage or acute infarction. Vascular: No hyperdense vessel or unexpected calcification. Skull: Normal. Negative for fracture or focal lesion. Sinuses/Orbits: No acute finding. Other: None. IMPRESSION: Mild chronic ischemic white matter disease. Old right cerebellar infarction. No acute intracranial abnormality seen. Electronically Signed   By: Marijo Conception M.D.   On: 08/12/2020 13:28   Labs on Admission: I have personally reviewed following labs  CBC: Recent Labs  Lab 08/12/20 1117  WBC 6.4  HGB 12.8  HCT 38.4  MCV 94.8  PLT 956   Basic Metabolic Panel: Recent Labs  Lab 08/12/20 1117  NA 140  K 3.4*  CL  103  CO2 24  GLUCOSE 135*  BUN 20  CREATININE 1.42*  CALCIUM 9.3  MG 2.3   GFR: Estimated Creatinine Clearance: 46.3 mL/min (A) (by C-G formula based on SCr of 1.42 mg/dL (H)). Liver Function Tests: Recent Labs  Lab 08/12/20 1117  AST 17  ALT 13  ALKPHOS 73  BILITOT 0.7  PROT 8.3*  ALBUMIN 4.0   Anemia Panel: Recent Labs    08/12/20 1557  RETICCTPCT 1.3   Urine analysis:    Component Value Date/Time   COLORURINE YELLOW (A) 08/12/2020 1307   APPEARANCEUR CLEAR (A) 08/12/2020 1307   LABSPEC 1.020 08/12/2020 1307   PHURINE 5.0 08/12/2020 1307   GLUCOSEU NEGATIVE 08/12/2020 1307   HGBUR SMALL (A) 08/12/2020 1307   BILIRUBINUR NEGATIVE 08/12/2020 Ar[place] 08/12/2020 1307   PROTEINUR 100 (A) 08/12/2020 1307   NITRITE NEGATIVE 08/12/2020 1307   LEUKOCYTESUR NEGATIVE 08/12/2020 1307   Tamaira Ciriello N Corretta Munce D.O. [facility]  If 7PM-7AM, please contact overnight-coverage provider If 7AM-7PM, please contact day coverage provider [email or web address]  08/12/2020, 5:00 PM

## 2020-08-12 NOTE — ED Notes (Signed)
Lab in to draw blood.

## 2020-08-12 NOTE — ED Triage Notes (Signed)
Pt comes into the ED via POV c/o AMS that has been ongoing x 3 weeks.  Per the husband she has had a harder time remembering things and her speech has been changing.  Pt has been having incontinence at night which is a new problem for her.  No diagnoses of dementia.  Pt denies any pain, SHOB, dizziness, N/V.  Pt currently has even and unlabored respirations.

## 2020-08-12 NOTE — ED Notes (Signed)
Husband leaving at this time, states he will return later this afternoon

## 2020-08-12 NOTE — ED Notes (Signed)
Dr. Cox at bedside.  

## 2020-08-12 NOTE — Consult Note (Addendum)
NEURO HOSPITALIST CONSULT NOTE   Requestig physician: Dr. Tobie Poet  Reason for Consult: Altered mental status  History obtained from:  Chart     HPI:                                                                                                                                          Patricia Ewing is an 70 y.o. female with a PMHx of HTN, HLD, atrial fibrillation, glaucoma and tobacco abuse who presents to the ED with a 3-4 week history of progressively worsening confusion. Symptoms include difficulty remembering things, ideational apraxia (for example, she has had trouble remembering how to use her microwave), not paying her bills correctly and speech changes including trouble with word finding and dysarthria. She also has been very unsteady on her feet and has had new onset of enuresis. The patient was unable to provide history, which was related to providers by her husband. Although oriented to the hospital and her husband's name, the patient states that she feels fine and has no complaints. There have been no recent medication changes.   She has not had any N/V, pain, dysuria or dizziness.   CT head in the ED showed an old right cerebellar ischemic infarction and mild ventricular widening.   Cr is elevated at 1.42 and eGFR is 40. AST and ALT are normal. Na, Mg and Ca are normal. No leukocytosis. Troponin is elevated at 179.   EKG shows T wave inversions but the patient denies any CP or SOB.     Past Medical History:  Diagnosis Date  . A-fib ([place])   . Bronchitis    chronic, mostly from fragrances, non since retirement  . Glaucoma   . Hyperlipidemia    managed with lifestyle modification  . Hypertension    controlled on meds  . Pneumonia    in past  . Tobacco abuse    No PFT's or diagnosis of COPD  . Wears partial dentures    upper    Past Surgical History:  Procedure Laterality Date  . ABDOMINAL HYSTERECTOMY  1984  . BREAST CYST EXCISION     abcess  drainage  . CATARACT EXTRACTION W/PHACO Left 12/18/2019   Procedure: CATARACT EXTRACTION PHACO AND INTRAOCULAR LENS PLACEMENT (IOC) LEFT VISION BLUE 3.43  00:22.5;  Surgeon: Marchia Meiers, MD;  Location: [place];  Service: Ophthalmology;  Laterality: Left;  . CATARACT EXTRACTION W/PHACO Right 01/15/2020   Procedure: CATARACT EXTRACTION PHACO AND INTRAOCULAR LENS PLACEMENT (IOC) RIGHT  6.29  00:39.6;  Surgeon: Marchia Meiers, MD;  Location: [place];  Service: Ophthalmology;  Laterality: Right;  . COLONOSCOPY WITH PROPOFOL    . COLONOSCOPY WITH PROPOFOL N/A 09/11/2018   Procedure: COLONOSCOPY WITH PROPOFOL;  Surgeon: Manya Silvas, MD;  Location: [facility] ENDOSCOPY;  Service: Endoscopy;  Laterality: N/A;    Family History  Problem Relation Age of Onset  . Heart attack Father 15  . Lymphoma Son   . Colon cancer Other   . Breast cancer Neg Hx               Social History:  reports that she has been smoking cigarettes. She has a 10.00 pack-year smoking history. She has never used smokeless tobacco. She reports current alcohol use of about 1.0 - 2.0 standard drink of alcohol per week. She reports that she does not use drugs.  Allergies  Allergen Reactions  . Erythromycin Diarrhea and Nausea Only  . Contrast Media [Iodinated Diagnostic Agents] Other (See Comments)    Flushed, cough, anxiety, mild less responsiveness.  . Metrizamide     MEDICATIONS:                                                                                                                     No current facility-administered medications on file prior to encounter.   Current Outpatient Medications on File Prior to Encounter  Medication Sig Dispense Refill  . albuterol (PROVENTIL HFA;VENTOLIN HFA) 108 (90 BASE) MCG/ACT inhaler Inhale 1 puff into the lungs every 6 (six) hours as needed. Shortness of breath    . aspirin 81 MG tablet Take 81 mg by mouth daily.    Marland Kitchen atorvastatin (LIPITOR) 40 MG tablet  Take 1 tablet (40 mg total) by mouth daily at 6 PM.    . brimonidine (ALPHAGAN) 0.2 % ophthalmic solution Place 1 drop into both eyes 2 (two) times daily.    . carvedilol (COREG) 6.25 MG tablet Take 6.25 mg by mouth 2 (two) times daily with a meal.    . cetirizine (ZYRTEC) 10 MG tablet Take 10 mg by mouth daily as needed. allergies    . cloNIDine (CATAPRES) 0.2 MG tablet Take 0.2 mg by mouth daily.    . dorzolamide (TRUSOPT) 2 % ophthalmic solution 1 drop 3 (three) times daily.    . furosemide (LASIX) 40 MG tablet Take 40 mg by mouth 2 (two) times daily.    . hydrALAZINE (APRESOLINE) 25 MG tablet Take 25 mg by mouth in the morning and at bedtime.    Marland Kitchen latanoprost (XALATAN) 0.005 % ophthalmic solution Place 1 drop into both eyes.    Marland Kitchen loratadine (CLARITIN) 10 MG tablet Take 10 mg by mouth daily.    . metoprolol (LOPRESSOR) 50 MG tablet Take 1 tablet (50 mg total) by mouth 2 (two) times daily. (Patient not taking: Reported on 01/05/2020)    . nitroGLYCERIN (NITROSTAT) 0.4 MG SL tablet Place 1 tablet (0.4 mg total) under the tongue every 5 (five) minutes as needed for chest pain.    . polyethylene glycol (MIRALAX / GLYCOLAX) packet Take 17 g by mouth daily. (Patient not taking: Reported on 12/10/2019) 14 each 0  . potassium chloride SA (K-DUR,KLOR-CON) 20 MEQ tablet Take 20 mEq by mouth 2 (two)  times daily.    . timolol (BETIMOL) 0.5 % ophthalmic solution 1 drop 2 (two) times daily.       ROS:                                                                                                                                       The patient is unable to provide a reliable ROS due to AMS.   Blood pressure (!) 190/92, pulse 75, temperature 99.2 F (37.3 C), temperature source Oral, resp. rate 16, height 5\' 7"  (1.702 m), weight 106.6 kg, SpO2 98 %.   General Examination:                                                                                                       Physical Exam  HEENT-  [place]/AT.  MMM. Skin temp to forehead is normal. No diaphoresis. Neck is supple.    Lungs- Respirations unlabored Extremities- Edema and chronic pigmentary changes to distal BLE  Neurological Examination Mental Status: Awake and alert. Decreased attention and concentration. Speech is at times with a childlike quality. Poor eye contact but will gaze at examiner when asked. Oriented to self and city, but had trouble recalling the state, giving a perseverative reply "11" multiple times before being coached towards the correct answer. Perseverates "11" again when asked the day of the week and the month. She knows that she is in the hospital "because they said something was wrong". She confabulates and is inconsistent when sensation is being tested. Speech is fluent and non-dysarthric with clear enunciation. No errors of grammar or syntax. Can follow a 2-step directional command. Able to name examiner's thumb and count fingers. Cannot add 5 + 3 or multiply 4 x 4. Can recite the months of the year forwards without any difficulty, but could not recite them backwards. Could spell "world" forwards but not backwards. Does not appear to have altered sensorium. Is pleasant and cooperative without agitation.  Cranial Nerves: II: Temporal visual fields intact with no extinction to DSS. PERRL.  III,IV, VI: No ptosis. Eyes are conjugate. When looking forwards with eyes mid-position, intermittent nystagmus is seen that is non-rhythmic and occurs obliquely in several directions. Saccadic visual pursuits are noted. Has difficulty maintaining gaze to left, right or vertically.   V: Facial temp sensation initially stated as equal, then stated she could not feel it on subsequent testing after a delay VII: Smile symmetric.  VIII: Hearing intact to voice IX,X: No hypophonia XI: Head  is midline XII: Midline tongue extension Motor: Right : Upper extremity   5/5    Left:     Upper extremity   5/5  Lower extremity   5/5     Lower  extremity   5/5 No tremor or asterixis noted.  No pronator drift.  Sensory: Inconsistent replies to questions when testing FT and temp in all 4 extremities; quality of the inconsistencies is suggestive of confabulation.  Deep Tendon Reflexes: 2+ and symmetric bilateral brachioradialis. 1+ bilateral patellae.  Cerebellar: No ataxia with FNF bilaterally. Was unable to perform H-S correctly.  Gait: Deferred   Lab Results: Basic Metabolic Panel: Recent Labs  Lab 08/12/20 1117  NA 140  K 3.4*  CL 103  CO2 24  GLUCOSE 135*  BUN 20  CREATININE 1.42*  CALCIUM 9.3  MG 2.3    CBC: Recent Labs  Lab 08/12/20 1117  WBC 6.4  HGB 12.8  HCT 38.4  MCV 94.8  PLT 214    Cardiac Enzymes: No results for input(s): CKTOTAL, CKMB, CKMBINDEX, TROPONINI in the last 168 hours.  Lipid Panel: No results for input(s): CHOL, TRIG, HDL, CHOLHDL, VLDL, LDLCALC in the last 168 hours.  Imaging: DG Chest 2 View  Result Date: 08/12/2020 CLINICAL DATA:  Weakness. EXAM: CHEST - 2 VIEW COMPARISON:  12/01/2015 FINDINGS: Stable cardiac enlargement. No pleural effusion or edema. No airspace densities identified. Visualized osseous structures are unremarkable. IMPRESSION: No acute cardiopulmonary abnormalities. Electronically Signed   By: Kerby Moors M.D.   On: 08/12/2020 13:41   CT Head Wo Contrast  Result Date: 08/12/2020 CLINICAL DATA:  Altered mental status. EXAM: CT HEAD WITHOUT CONTRAST TECHNIQUE: Contiguous axial images were obtained from the base of the skull through the vertex without intravenous contrast. COMPARISON:  None. FINDINGS: Brain: Mild chronic ischemic white matter disease is noted. Old right cerebellar infarction is noted. No mass effect or midline shift is noted. Ventricular size is within normal limits. There is no evidence of mass lesion, hemorrhage or acute infarction. Vascular: No hyperdense vessel or unexpected calcification. Skull: Normal. Negative for fracture or focal lesion.  Sinuses/Orbits: No acute finding. Other: None. IMPRESSION: Mild chronic ischemic white matter disease. Old right cerebellar infarction. No acute intracranial abnormality seen. Electronically Signed   By: Marijo Conception M.D.   On: 08/12/2020 13:28    Assessment: 70 year old female with a 3-4 week history of cognitive decline 1. Exam findings best localize as diffuse anterior frontal lobe dysfunction. DDx includes psychogenic due to possible life stressors, psychiatric due to possible mood disorder, psychosis or catatonia, unusual presentation of stroke, inflammatory encephalopathy (such as paraneoplastic syndrome), Wernicke's encephalopathy (has ocular motility deficits on exam, confabulates and was noted to have an unsteady gait at home) and subclinical seizures.  2. Elevated troponin is noted, which can be seen with strokes.  3. Vitamin B12 level is low by neurological standards at 286.  4. AST and ALT are normal. Na, Ca and Mg are also normal.  5. No neck stiffness, fever or white count to suggest a meningitis.   Recommendations: 1. STAT thiamine level has been ordered. After blood has been drawn for thiamine level, start high-dose empiric IV thiamine therapy at 500 mg IV  TID x 3 days (ordered), followed by 250 mg IV TID for three additional days and then 100 mg po qd thereafter.  2. EEG has been ordered.  3. MRI brain without contrast has been ordered (cannot perform with gadolinium contrast as she has  impaired renal function)  4. B12 supplementation with 1000 mcg sq qd x 1 week, then 1000 mcg sq qweek x 4 weeks, then 1000 mcg sq qmonth thereafter.  5. TSH, RPR and ammonia levels (ordered)  Addendum: - MRI reveals multifocal acute foci if infarction in the left frontal lobe. DDx includes cardioembolic stroke, atheroembolic stroke and less likely a vasculitis.  - Will need TTE and MRA of head (ordered).  - Carotid ultrasound (ordered) - Of note, she has an allergy to iodinated contrast   -  Cardiac telemetry - ANA panel, ESR and CRP have been ordered - Hepatitis C antibody has been ordered (hepatitis C can be associated with CNS vasculitis)  Electronically signed: Dr. Kerney Elbe  08/12/2020, 4:26 PM

## 2020-08-12 NOTE — ED Notes (Signed)
Patient to CT.

## 2020-08-12 NOTE — ED Notes (Signed)
Pt sitting up in bed awake and alert; RR even and unlabored on RA -- now to MRI via stretcher escorted by MRI tech (purewick disconnected for transport)

## 2020-08-12 NOTE — ED Provider Notes (Signed)
[facility] Emergency Department Provider Note   ____________________________________________   Event Date/Time   First MD Initiated Contact with Patient 08/12/20 1300     (approximate)  I have reviewed the triage vital signs and the nursing notes.   HISTORY  Chief Complaint Altered Mental Status    HPI Patricia Ewing is a 70 y.o. female with past medical history of hypertension, hyperlipidemia, and atrial fibrillation who presents to the ED for altered mental status.  Majority of history is obtained from patient's husband, who states that for the past 3 to 4 weeks she has been increasingly confused.  He has also noticed that her speech has been slurred and she has been very unsteady on her feet.  Patient states that she feels fine, currently denies any complaints.  She has not had any fevers, cough, chest pain, or shortness of breath.  She also denies any dysuria or hematuria.  She has not had any recent changes in her medications.        Past Medical History:  Diagnosis Date  . A-fib (HCC)   . Bronchitis    chronic, mostly from fragrances, non since retirement  . Glaucoma   . Hyperlipidemia    managed with lifestyle modification  . Hypertension    controlled on meds  . Pneumonia    in past  . Tobacco abuse    No PFT's or diagnosis of COPD  . Wears partial dentures    upper    Patient Active Problem List   Diagnosis Date Noted  . Altered mental status 08/12/2020  . Sepsis (HCC) 11/29/2015  . Acute chest pain 11/30/2011  . HTN (hypertension) 11/30/2011  . Bilateral leg edema 11/30/2011    Past Surgical History:  Procedure Laterality Date  . ABDOMINAL HYSTERECTOMY  1984  . BREAST CYST EXCISION     abcess drainage  . CATARACT EXTRACTION W/PHACO Left 12/18/2019   Procedure: CATARACT EXTRACTION PHACO AND INTRAOCULAR LENS PLACEMENT (IOC) LEFT VISION BLUE 3.43  00:22.5;  Surgeon: Elliot Cousin, MD;  Location: [facility];  Service:  Ophthalmology;  Laterality: Left;  . CATARACT EXTRACTION W/PHACO Right 01/15/2020   Procedure: CATARACT EXTRACTION PHACO AND INTRAOCULAR LENS PLACEMENT (IOC) RIGHT  6.29  00:39.6;  Surgeon: Elliot Cousin, MD;  Location: [facility];  Service: Ophthalmology;  Laterality: Right;  . COLONOSCOPY WITH PROPOFOL    . COLONOSCOPY WITH PROPOFOL N/A 09/11/2018   Procedure: COLONOSCOPY WITH PROPOFOL;  Surgeon: Scot Jun, MD;  Location: [facility] END[facility];  Service: Endoscopy;  Laterality: N/A;    Prior to Admission medications   Medication Sig Start Date End Date Taking? Authorizing Provider  albuterol (PROVENTIL HFA;VENTOLIN HFA) 108 (90 BASE) MCG/ACT inhaler Inhale 1 puff into the lungs every 6 (six) hours as needed. Shortness of breath    [provider]  aspirin 81 MG tablet Take 81 mg by mouth daily.    [provider]  atorvastatin (LIPITOR) 40 MG tablet Take 1 tablet (40 mg total) by mouth daily at 6 PM. 12/06/15   Shaune Pollack, MD  brimonidine [facility]) 0.2 % ophthalmic solution Place 1 drop into both eyes 2 (two) times daily.    [provider]  carvedilol (COREG) 6.25 MG tablet Take 6.25 mg by mouth 2 (two) times daily with a meal.    [provider]  cetirizine (ZYRTEC) 10 MG tablet Take 10 mg by mouth daily as needed. allergies    [provider]  cloNIDine (CATAPRES) 0.2  MG tablet Take 0.2 mg by mouth daily.    [provider]  dorzolamide (TRUSOPT) 2 % ophthalmic solution 1 drop 3 (three) times daily.    [provider]  furosemide (LASIX) 40 MG tablet Take 40 mg by mouth 2 (two) times daily.    [provider]  hydrALAZINE (APRESOLINE) 25 MG tablet Take 25 mg by mouth in the morning and at bedtime.    [provider]  latanoprost (XALATAN) 0.005 % ophthalmic solution Place 1 drop into both eyes.    [provider]  loratadine (CLARITIN) 10 MG tablet Take 10 mg by mouth daily.    [provider]  metoprolol (LOPRESSOR) 50 MG tablet Take 1 tablet (50 mg total) by mouth 2 (two) times daily. Patient not taking: Reported on 01/05/2020 12/06/15   Demetrios Loll, MD  nitroGLYCERIN (NITROSTAT) 0.4 MG SL tablet Place 1 tablet (0.4 mg total) under the tongue every 5 (five) minutes as needed for chest pain. 12/06/15   Demetrios Loll, MD  polyethylene glycol [facility] Floria Raveling) packet Take 17 g by mouth daily. Patient not taking: Reported on 12/10/2019 12/06/15   Demetrios Loll, MD  potassium chloride SA (K-DUR,KLOR-CON) 20 MEQ tablet Take 20 mEq by mouth 2 (two) times daily.    [provider]  timolol (BETIMOL) 0.5 % ophthalmic solution 1 drop 2 (two) times daily.    [provider]    Allergies Erythromycin, Contrast media [iodinated diagnostic agents], and Metrizamide  Family History  Problem Relation Age of Onset  . Heart attack Father 44  . Lymphoma Son   . Colon cancer Other   . Breast cancer Neg Hx     Social History Social History   Tobacco Use  . Smoking status: Current Every Day Smoker    Packs/day: 0.25    Years: 40.00    Pack years: 10.00    Types: Cigarettes  . Smokeless tobacco: Never Used  Vaping Use  . Vaping Use: Never used  Substance Use Topics  . Alcohol use: Yes    Alcohol/week: 1.0 - 2.0 standard drink    Types: 1 - 2 Standard drinks or equivalent per week    Comment: social  . Drug use: No    Review of Systems  Constitutional: No fever/chills Eyes: No visual changes. ENT: No sore throat. Cardiovascular: Denies chest pain. Respiratory: Denies shortness of breath. Gastrointestinal: No abdominal pain.  No nausea, no vomiting.  No diarrhea.  No constipation. Genitourinary: Negative for dysuria. Musculoskeletal: Negative for back pain. Skin: Negative for rash. Neurological: Negative for headaches, focal weakness or numbness.  Positive for confusion.  ____________________________________________   PHYSICAL EXAM:  VITAL SIGNS: ED  Triage Vitals  Enc Vitals Group     BP 08/12/20 1048 (!) 139/97     Pulse Rate 08/12/20 1048 (!) 106     Resp 08/12/20 1048 18     Temp 08/12/20 1048 99.2 F (37.3 C)     Temp Source 08/12/20 1048 Oral     SpO2 08/12/20 1048 96 %     Weight 08/12/20 1113 235 lb 0.2 oz (106.6 kg)     Height 08/12/20 1113 5\' 7"  (1.702 m)     Head Circumference --      Peak Flow --      Pain Score 08/12/20 1113 0     Pain Loc --      Pain Edu? --      Excl. in [place]? --  Constitutional: Alert and oriented to person and place, but not time. Eyes: Conjunctivae are normal.  Pupils equal round and reactive to light bilaterally. Head: Atraumatic. Nose: No congestion/rhinnorhea. Mouth/Throat: Mucous membranes are moist. Neck: Normal ROM Cardiovascular: Normal rate, regular rhythm. Grossly normal heart sounds. Respiratory: Normal respiratory effort.  No retractions. Lungs CTAB. Gastrointestinal: Soft and nontender. No distention. Genitourinary: deferred Musculoskeletal: No lower extremity tenderness nor edema. Neurologic: Slurred speech noted. No gross focal neurologic deficits are appreciated. Skin:  Skin is warm, dry and intact. No rash noted. Psychiatric: Mood and affect are normal. Speech and behavior are normal.  ____________________________________________   LABS (all labs ordered are listed, but only abnormal results are displayed)  Labs Reviewed  COMPREHENSIVE METABOLIC PANEL - Abnormal; Notable for the following components:      Result Value   Potassium 3.4 (*)    Glucose, Bld 135 (*)    Creatinine, Ser 1.42 (*)    Total Protein 8.3 (*)    GFR, Estimated 40 (*)    All other components within normal limits  URINALYSIS, COMPLETE (UACMP) WITH MICROSCOPIC - Abnormal; Notable for the following components:   Color, Urine YELLOW (*)    APPearance CLEAR (*)    Hgb urine dipstick SMALL (*)    Protein, ur 100 (*)    All other components within normal limits  TROPONIN I (HIGH SENSITIVITY) -  Abnormal; Notable for the following components:   Troponin I (High Sensitivity) 179 (*)    All other components within normal limits  SARS CORONAVIRUS 2 (TAT 6-24 HRS)  CBC  MAGNESIUM  THYROID PANEL WITH TSH  VITAMIN B12  FOLATE  IRON AND TIBC  FERRITIN  RETICULOCYTES  CBG MONITORING, ED   ____________________________________________  EKG  ED ECG REPORT I, Blake Divine, the attending physician, personally viewed and interpreted this ECG.   Date: 08/12/2020  EKG Time:   Rate: 11:07  Rhythm: sinus tachycardia  Axis: Normal  Intervals:none  ST&T Change: Lateral T wave inversions   PROCEDURES  Procedure(s) performed (including Critical Care):  Procedures   ____________________________________________   INITIAL IMPRESSION / ASSESSMENT AND PLAN / ED COURSE       70 year old female with past medical history of hypertension, hyperlipidemia, and atrial fibrillation who presents to the ED for increasing confusion over the past 3 to 4 weeks with unsteady gait and slurred speech.  She is alert and oriented to person and place only, appears to have slurred speech but otherwise no focal deficits in her extremities.  CT head is performed and shows old cerebellar stroke, but given onset of symptoms almost 1 month ago this could be an explanation for her presentation.  Labs thus far remarkable for elevated troponin.  EKG shows T wave inversions but patient denies any chest pain or shortness of breath, low suspicion for ACS at this time.  Stroke could potentially explain her elevated troponin.  UA is pending.  Case discussed with hospitalist for admission for further stroke work-up.      ____________________________________________   FINAL CLINICAL IMPRESSION(S) / ED DIAGNOSES  Final diagnoses:  Altered mental status, unspecified altered mental status type  Slurred speech  Elevated troponin     ED Discharge Orders    None       Note:  This document was prepared  using Dragon voice recognition software and may include unintentional dictation errors.   Blake Divine, MD 08/12/20 [phone]

## 2020-08-13 ENCOUNTER — Observation Stay: Payer: Medicare Other

## 2020-08-13 ENCOUNTER — Observation Stay
Admit: 2020-08-13 | Discharge: 2020-08-13 | Disposition: A | Discharge status: Home / Self Care | Payer: Medicare Other | Attending: Neurology | Admitting: Neurology

## 2020-08-13 DIAGNOSIS — I248 Other forms of acute ischemic heart disease: Secondary | ICD-10-CM | POA: Diagnosis present

## 2020-08-13 DIAGNOSIS — N179 Acute kidney failure, unspecified: Secondary | ICD-10-CM | POA: Diagnosis present

## 2020-08-13 DIAGNOSIS — I129 Hypertensive chronic kidney disease with stage 1 through stage 4 chronic kidney disease, or unspecified chronic kidney disease: Secondary | ICD-10-CM | POA: Diagnosis present

## 2020-08-13 DIAGNOSIS — E538 Deficiency of other specified B group vitamins: Secondary | ICD-10-CM | POA: Diagnosis present

## 2020-08-13 DIAGNOSIS — R29706 NIHSS score 6: Secondary | ICD-10-CM | POA: Diagnosis present

## 2020-08-13 DIAGNOSIS — I6389 Other cerebral infarction: Secondary | ICD-10-CM | POA: Diagnosis present

## 2020-08-13 DIAGNOSIS — H409 Unspecified glaucoma: Secondary | ICD-10-CM | POA: Diagnosis present

## 2020-08-13 DIAGNOSIS — I10 Essential (primary) hypertension: Secondary | ICD-10-CM | POA: Diagnosis not present

## 2020-08-13 DIAGNOSIS — E669 Obesity, unspecified: Secondary | ICD-10-CM | POA: Diagnosis present

## 2020-08-13 DIAGNOSIS — I674 Hypertensive encephalopathy: Secondary | ICD-10-CM | POA: Diagnosis present

## 2020-08-13 DIAGNOSIS — N1832 Chronic kidney disease, stage 3b: Secondary | ICD-10-CM | POA: Diagnosis present

## 2020-08-13 DIAGNOSIS — F17210 Nicotine dependence, cigarettes, uncomplicated: Secondary | ICD-10-CM | POA: Diagnosis present

## 2020-08-13 DIAGNOSIS — R4701 Aphasia: Secondary | ICD-10-CM | POA: Diagnosis present

## 2020-08-13 DIAGNOSIS — E039 Hypothyroidism, unspecified: Secondary | ICD-10-CM | POA: Diagnosis present

## 2020-08-13 DIAGNOSIS — I63522 Cerebral infarction due to unspecified occlusion or stenosis of left anterior cerebral artery: Secondary | ICD-10-CM | POA: Diagnosis present

## 2020-08-13 DIAGNOSIS — R4182 Altered mental status, unspecified: Secondary | ICD-10-CM

## 2020-08-13 DIAGNOSIS — I252 Old myocardial infarction: Secondary | ICD-10-CM | POA: Diagnosis not present

## 2020-08-13 DIAGNOSIS — I480 Paroxysmal atrial fibrillation: Secondary | ICD-10-CM | POA: Diagnosis present

## 2020-08-13 DIAGNOSIS — E876 Hypokalemia: Secondary | ICD-10-CM | POA: Diagnosis present

## 2020-08-13 DIAGNOSIS — E785 Hyperlipidemia, unspecified: Secondary | ICD-10-CM | POA: Diagnosis present

## 2020-08-13 DIAGNOSIS — I63412 Cerebral infarction due to embolism of left middle cerebral artery: Secondary | ICD-10-CM | POA: Diagnosis not present

## 2020-08-13 DIAGNOSIS — I2510 Atherosclerotic heart disease of native coronary artery without angina pectoris: Secondary | ICD-10-CM | POA: Diagnosis present

## 2020-08-13 DIAGNOSIS — E519 Thiamine deficiency, unspecified: Secondary | ICD-10-CM | POA: Diagnosis present

## 2020-08-13 DIAGNOSIS — I639 Cerebral infarction, unspecified: Secondary | ICD-10-CM | POA: Diagnosis not present

## 2020-08-13 DIAGNOSIS — R5381 Other malaise: Secondary | ICD-10-CM | POA: Diagnosis present

## 2020-08-13 DIAGNOSIS — I700 Atherosclerosis of aorta: Secondary | ICD-10-CM | POA: Diagnosis present

## 2020-08-13 DIAGNOSIS — R471 Dysarthria and anarthria: Secondary | ICD-10-CM | POA: Diagnosis present

## 2020-08-13 DIAGNOSIS — R270 Ataxia, unspecified: Secondary | ICD-10-CM | POA: Diagnosis present

## 2020-08-13 DIAGNOSIS — Z20822 Contact with and (suspected) exposure to covid-19: Secondary | ICD-10-CM | POA: Diagnosis present

## 2020-08-13 LAB — CBC
HCT: 36.2 % (ref 36.0–46.0)
Hemoglobin: 12.2 g/dL (ref 12.0–15.0)
MCH: 31.8 pg (ref 26.0–34.0)
MCHC: 33.7 g/dL (ref 30.0–36.0)
MCV: 94.3 fL (ref 80.0–100.0)
Platelets: 188 10*3/uL (ref 150–400)
RBC: 3.84 MIL/uL — ABNORMAL LOW (ref 3.87–5.11)
RDW: 13.1 % (ref 11.5–15.5)
WBC: 7.1 10*3/uL (ref 4.0–10.5)
nRBC: 0 % (ref 0.0–0.2)

## 2020-08-13 LAB — ECHOCARDIOGRAM COMPLETE BUBBLE STUDY
AR max vel: 2.41 cm2
AV Area VTI: 2.39 cm2
AV Area mean vel: 2.37 cm2
AV Mean grad: 4 mmHg
AV Peak grad: 8 mmHg
Ao pk vel: 1.41 m/s
Area-P 1/2: 3.15 cm2
MV VTI: 1.87 cm2
S' Lateral: 3.18 cm

## 2020-08-13 LAB — ETHANOL: Alcohol, Ethyl (B): <10 mg/dL (ref <10)

## 2020-08-13 LAB — TSH: TSH: 6.033 u[IU]/mL — ABNORMAL HIGH (ref 0.350–4.500)

## 2020-08-13 LAB — RPR: RPR Ser Ql: NONREACTIVE

## 2020-08-13 LAB — BASIC METABOLIC PANEL
Anion gap: 8 (ref 5–15)
BUN: 18 mg/dL (ref 8–23)
CO2: 26 mmol/L (ref 22–32)
Calcium: 8.7 mg/dL — ABNORMAL LOW (ref 8.9–10.3)
Chloride: 105 mmol/L (ref 98–111)
Creatinine, Ser: 1.26 mg/dL — ABNORMAL HIGH (ref 0.44–1.00)
GFR, Estimated: 46 mL/min — ABNORMAL LOW (ref >60)
Glucose, Bld: 105 mg/dL — ABNORMAL HIGH (ref 70–99)
Potassium: 3.3 mmol/L — ABNORMAL LOW (ref 3.5–5.1)
Sodium: 139 mmol/L (ref 135–145)

## 2020-08-13 LAB — C-REACTIVE PROTEIN: CRP: 0.8 mg/dL (ref <1.0)

## 2020-08-13 LAB — SARS CORONAVIRUS 2 (TAT 6-24 HRS): SARS Coronavirus 2: NEGATIVE

## 2020-08-13 LAB — TROPONIN I (HIGH SENSITIVITY): Troponin I (High Sensitivity): 192 ng/L (ref <18)

## 2020-08-13 LAB — HIV ANTIBODY (ROUTINE TESTING W REFLEX): HIV Screen 4th Generation wRfx: NONREACTIVE

## 2020-08-13 LAB — AMMONIA: Ammonia: 16 umol/L (ref 9–35)

## 2020-08-13 LAB — HEPATITIS C ANTIBODY: HCV Ab: NONREACTIVE

## 2020-08-13 LAB — BRAIN NATRIURETIC PEPTIDE: B Natriuretic Peptide: 73.5 pg/mL (ref 0.0–100.0)

## 2020-08-13 LAB — SEDIMENTATION RATE: Sed Rate: 43 mm/hr — ABNORMAL HIGH (ref 0–22)

## 2020-08-13 IMAGING — US US CAROTID DUPLEX BILAT
1 series · 13 of 24 positions shown · non-contrast
Comparison: None.

CLINICAL DATA: Stroke, hypertension, hyperlipidemia, history of
tobacco use

EXAM:
BILATERAL CAROTID DUPLEX ULTRASOUND
TECHNIQUE: Gray scale imaging, color Doppler and duplex ultrasound were
performed of bilateral carotid and vertebral arteries in the neck.

[Series 1: us carotid bilateral · 13 of 65 slices shown]
[im 1/65]
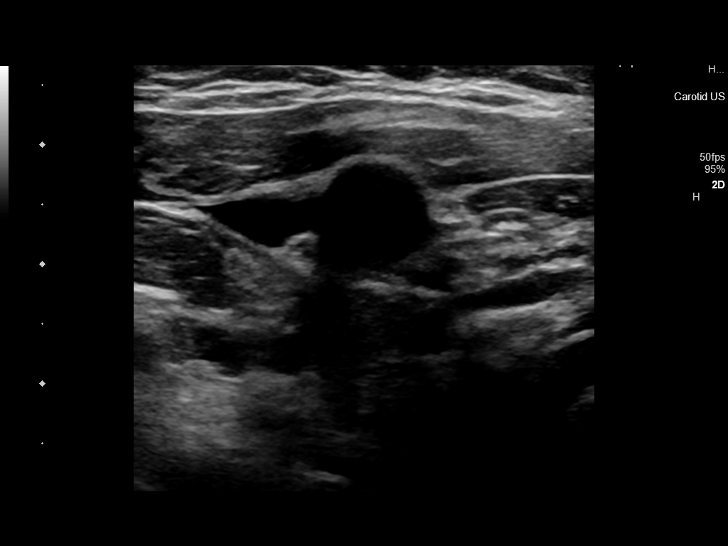
[im 6/65]
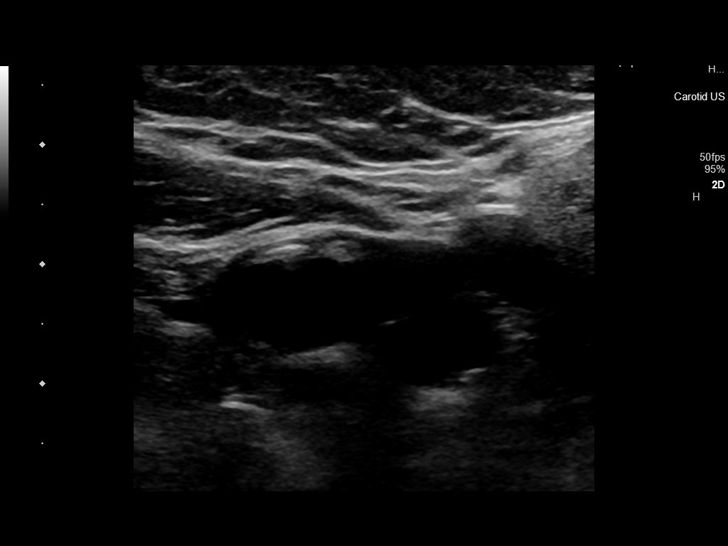
[im 12/65]
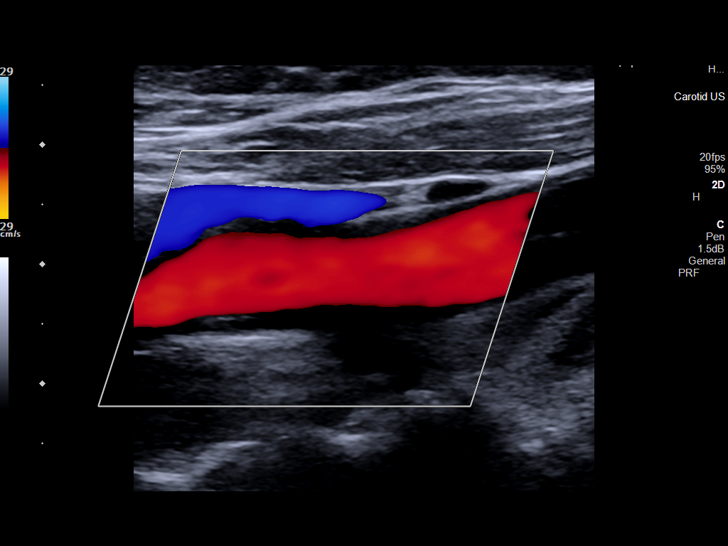
[im 17/65]
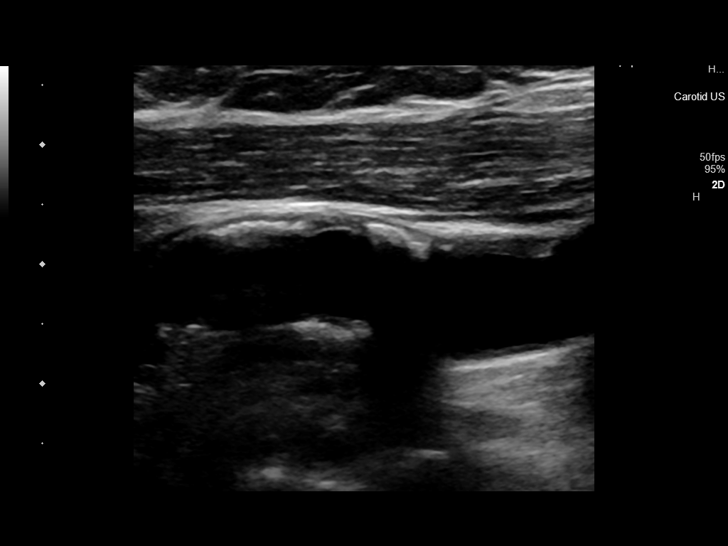
[im 23/65]
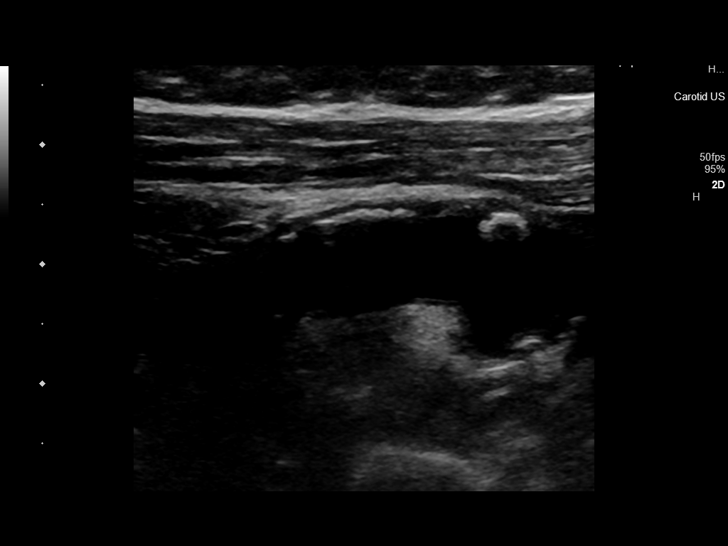
[im 28/65]
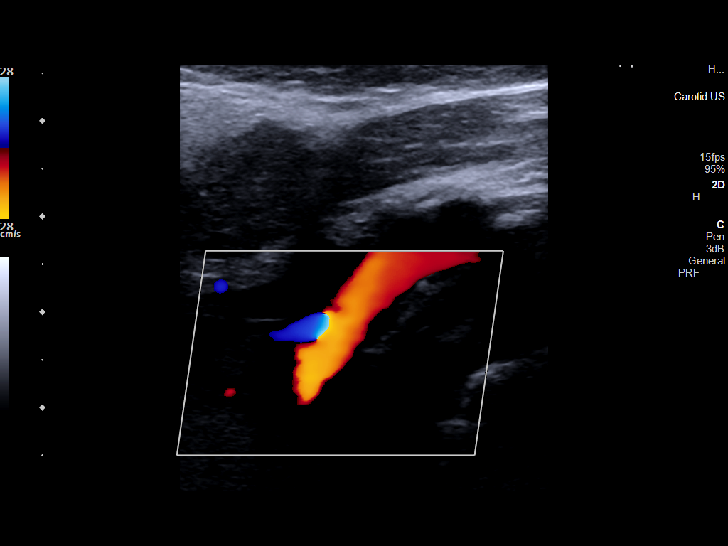
[im 34/65]
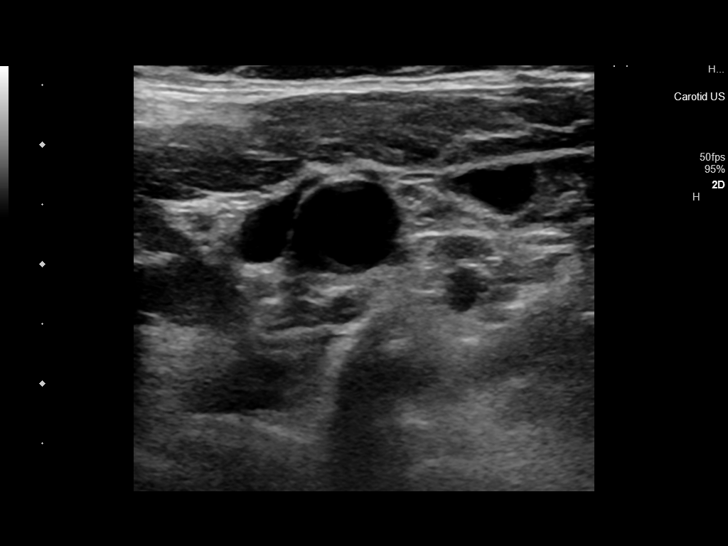
[im 37/65]
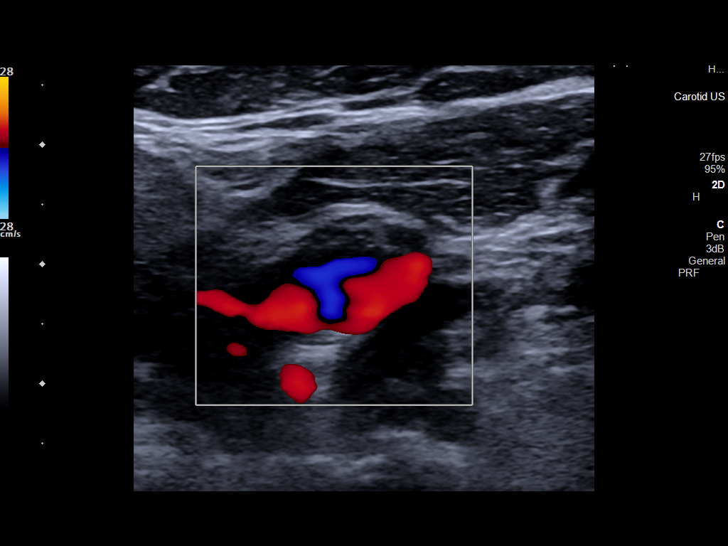
[im 42/65]
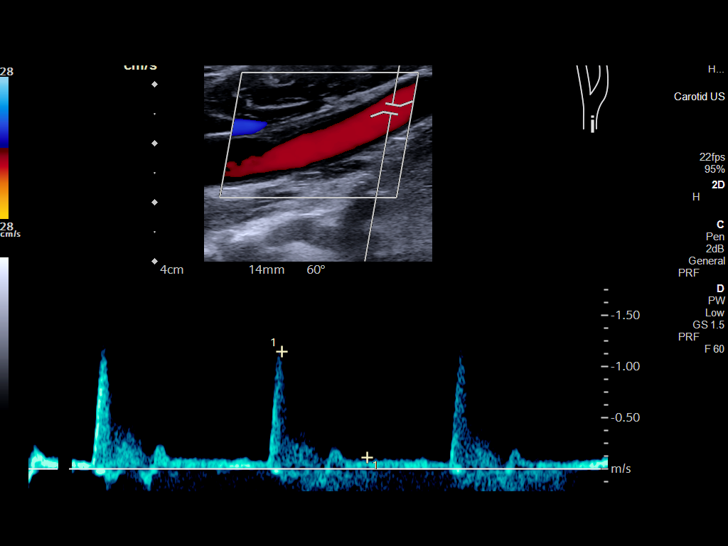
[im 48/65]
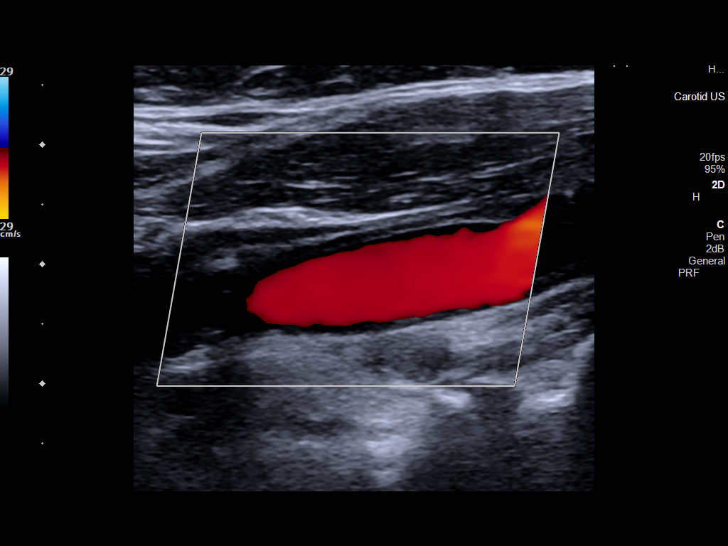
[im 53/65]
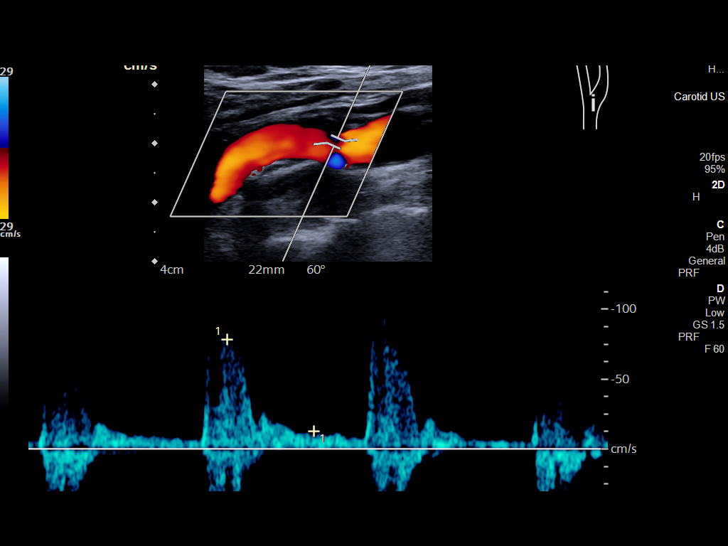
[im 59/65]
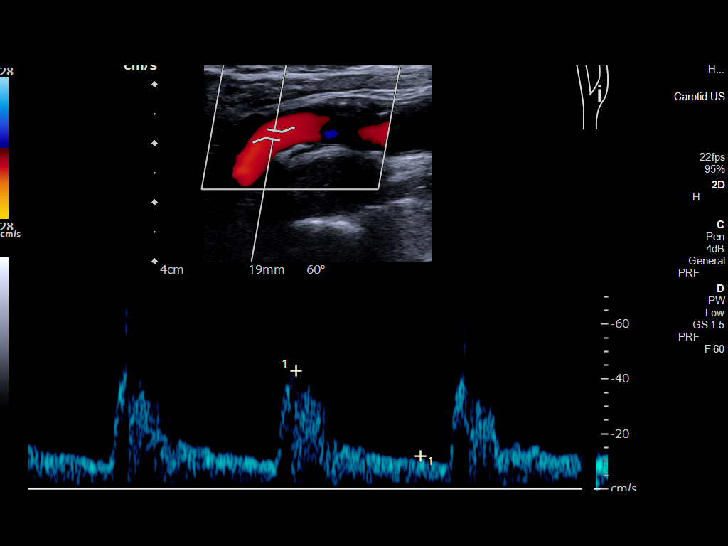
[im 65/65]
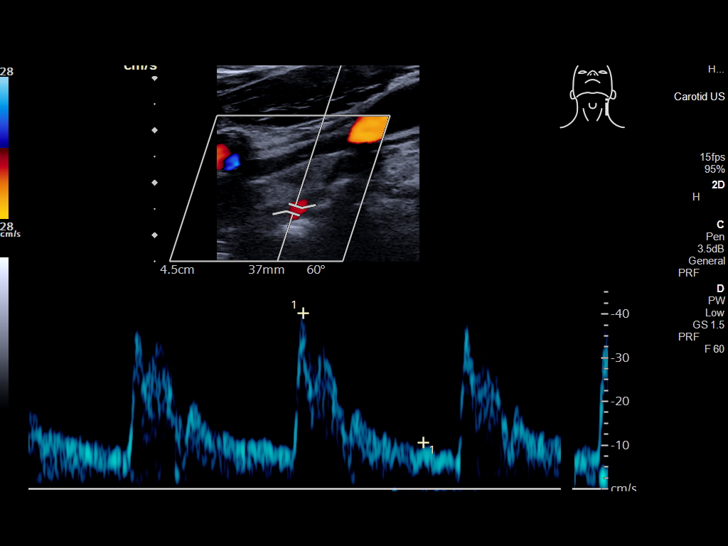

[13 of 24 positions shown; findings below may reference images not displayed]

FINDINGS: Criteria: Quantification of carotid stenosis is based on velocity
parameters that correlate the residual internal carotid diameter
with NASCET-based stenosis levels, using the diameter of the distal
internal carotid lumen as the denominator for stenosis measurement.

The following velocity measurements were obtained:

RIGHT

ICA: 68/19 cm/sec

CCA: 76/11 cm/sec

SYSTOLIC ICA/CCA RATIO:

ECA: 92 cm/sec

LEFT

ICA: 76/22 cm/sec

CCA: 99/9 cm/sec

SYSTOLIC ICA/CCA RATIO:

ECA: 111 cm/sec

RIGHT CAROTID ARTERY: Eccentric calcified plaque in the distal CCA,
bulb, and ICA origin with only mild stenosis. Normal waveforms and
color Doppler signal throughout.

RIGHT VERTEBRAL ARTERY:  Normal flow direction and waveform.

LEFT CAROTID ARTERY: Eccentric partially calcified plaque in the
distal common carotid artery, bulb, and proximal ICA with mild
stenosis. Normal waveforms and color Doppler signal throughout. Mild
tortuosity ICA.

LEFT VERTEBRAL ARTERY:  Normal flow direction and waveform.
IMPRESSION: 1. Bilateral carotid bifurcation plaque resulting in less than 50%
diameter ICA stenosis.
2. Antegrade bilateral vertebral arterial flow.

## 2020-08-13 MED ORDER — SODIUM CHLORIDE FLUSH 0.9 % IV SOLN
INTRAVENOUS | Status: AC
  Filled 2020-08-13: qty 20

## 2020-08-13 MED ORDER — SODIUM CHLORIDE 0.9 % IV SOLN
INTRAVENOUS | Status: DC | PRN
  Administered 2020-08-13: 250 mL via INTRAVENOUS

## 2020-08-13 MED ORDER — CYANOCOBALAMIN 1000 MCG/ML IJ SOLN
1000.0000 ug | Freq: Every day | INTRAMUSCULAR | Status: AC
  Administered 2020-08-13 – 2020-08-17 (×4): 1000 ug via SUBCUTANEOUS
  Filled 2020-08-13 (×5): qty 1

## 2020-08-13 MED ORDER — POTASSIUM CHLORIDE 10 MEQ/100ML IV SOLN
10.0000 meq | INTRAVENOUS | Status: DC
Start: 2020-08-13 — End: 2020-08-13
  Administered 2020-08-13: 10 meq via INTRAVENOUS
  Filled 2020-08-13: qty 100

## 2020-08-13 MED ORDER — HYDRALAZINE HCL 20 MG/ML IJ SOLN
10.0000 mg | Freq: Four times a day (QID) | INTRAMUSCULAR | Status: DC | PRN
  Administered 2020-08-17: 10 mg via INTRAVENOUS
  Filled 2020-08-13: qty 1

## 2020-08-13 MED ORDER — POTASSIUM CHLORIDE 20 MEQ PO PACK
40.0000 meq | PACK | ORAL | Status: AC
  Administered 2020-08-13 (×2): 40 meq via ORAL
  Filled 2020-08-13 (×2): qty 2

## 2020-08-13 MED ORDER — ENOXAPARIN SODIUM 40 MG/0.4ML ~~LOC~~ SOLN
40.0000 mg | SUBCUTANEOUS | Status: DC
  Administered 2020-08-13 – 2020-08-17 (×5): 40 mg via SUBCUTANEOUS
  Filled 2020-08-13 (×5): qty 0.4

## 2020-08-13 MED ORDER — LEVOTHYROXINE SODIUM 50 MCG PO TABS
50.0000 ug | ORAL_TABLET | Freq: Every day | ORAL | Status: DC
  Administered 2020-08-14 – 2020-08-18 (×4): 50 ug via ORAL
  Filled 2020-08-13 (×4): qty 1

## 2020-08-13 NOTE — Plan of Care (Signed)
Pt arrived unit from the ED, alert and oriented to self, she could tell she's in [place]. 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 Pt oriented to the unit. LR @125ml /hr per order. Call bell within reach. Falls precautions in place Problem: Coping: Goal: Level of anxiety will decrease Outcome: Progressing   Problem: Pain Managment: Goal: General experience of comfort will improve Outcome: Progressing   Problem: Safety: Goal: Ability to remain free from injury will improve Outcome: Progressing

## 2020-08-13 NOTE — Progress Notes (Addendum)
Carotid ultrasound: 1. Bilateral carotid bifurcation plaque resulting in less than 50% diameter ICA stenosis. 2. Antegrade bilateral vertebral arterial flow.  TTE: Left ventricular ejection fraction, by estimation, is 70 to 75%. The  left ventricle has hyperdynamic function. The left ventricle has no  regional wall motion abnormalities. No PFO or mural thrombus mentioned in the report.   MRA head: Normal intracranial MRA.  EEG: Pending  A/R: Patricia Ewing with a 3-4 week history of cognitive decline 1. Exam findings best localized as diffuse anterior frontal lobe dysfunction.  2. MRI reveals multifocal acute infarctions within the left frontal lobe and insula. Old right cerebellar infarct and findings of chronic microvascular disease as well as multifocal predominantly central chronic microhemorrhages, consistent with chronic hypertensive angiopathy. 3. MRA head shows no vessel occlusion or stenosis.  4. TTE with no PFO or mural thrombus mentioned in the report.  5. Carotid ultrasound with bilateral carotid bifurcation plaque resulting in less than 50% diameter ICA stenosis. 6. Overall clinical presentation and imaging findings are most consistent with chronic hypertensive angiopathy, acute left frontal lobe stroke and hypertensive encephalopathy. The latter best explains her 3-4 week history of cognitive decline.  7. Also on DDx is an overlapping acute thiamine deficiency. Continue high dose thiamine.  8. EEG is still pending.  9. Vitamin B12 level is low by neurological standards at 286. Continue B12 supplementation. For vitamin B12 deficiency, standard of care is B12 supplementation with 1000 mcg sq qd x 1 week, then 1000 mcg sq qweek x 4 weeks, then 1000 mcg sq qmonth thereafter.  10. TSH is elevated. Management per primary team.  11. Ammonia came back normal.   12. RPR non-reactive 13. ANA panel, ESR and CRP have been ordered 14. Hepatitis C antibody has been ordered  (hepatitis C can be associated with CNS vasculitis)   Addendum: EEG impression: This study is suggestive cortical dysfunction in left frontotemporal  Region likely secondary to underlying stroke. No seizures or epileptiform discharges were seen throughout the recording.  Electronically signed: Dr. Kerney Elbe

## 2020-08-13 NOTE — Progress Notes (Signed)
*  PRELIMINARY RESULTS* Echocardiogram 2D Echocardiogram has been performed.  Patricia Ewing 08/13/2020, 12:17 PM

## 2020-08-13 NOTE — Progress Notes (Signed)
PROGRESS NOTE    Patricia Ewing  [id number] DOB: 1951/04/29 DOA: 08/12/2020 PCP: Baxter Hire, MD   Chief complaint. Altered mental status. Brief Narrative:  Patricia Ewing is a 70 y.o. female with medical history significant for cataracts status post cataract extraction and left, coronary artery disease, pretension, atherosclerosis of abdominal aorta, history of NSTEMI, hyperlipidemia, atrial fibrillation, presented to the emergency department for chief concerns of confusion. MRI of the brain showed multifocal acute ischemia within the left frontal lobe and insula. She also has significant angiopathy. She has been seen by neurology, she also has overlapping acute thiamine deficiency. Started on high-dose of thiamine for 3 days. She has B12 deficiency, started subcu injection for 7 days.   Assessment & Plan:   Principal Problem:   Altered mental status Active Problems:   HTN (hypertension)   Obesity (BMI 30-39.9)   AKI (acute kidney injury) ([place])  #1. Acute ischemic stroke in the left frontal lobe. Chronic hypertensive angiopathy. Hypertension encephalopathy. Continue blood pressure control, Lipitor and aspirin.  #2. Thiamine deficiency. Patient receiving IV thiamine in the high dose.  3. Vitamin B12 deficiency. Per neurology recommendation, patient be treated with B12 injection every day for 7 days, every week for 4 weeks then monthly.  #4. Hypothyroidism. Start Synthroid.  5. Acute kidney injury Hypokalemia. Renal function improving. Supplement potassium.    DVT prophylaxis: Lovenox Code Status: Full Family Communication:  Disposition Plan:  .   Status is: Observation  The patient will require care spanning > 2 midnights and should be moved to inpatient because: Inpatient level of care appropriate due to severity of illness  Dispo: The patient is from: Home              Anticipated d/c is to: Home              Anticipated d/c date is: 3 days               Patient currently is not medically stable to d/c.        No intake/output data recorded. Total I/O In: 360 [P.O.:360] Out: -      Consultants:   Neurology  Procedures: None  Antimicrobials: None  Subjective: Patient mental status is better today, she has less confusion today. She does not have a headache. Denies any short of breath or cough. Does not have any abdominal pain or nausea vomiting. No fever or chills.  Objective: Vitals:   08/13/20 0137 08/13/20 0523 08/13/20 0840 08/13/20 1226  BP: (!) 148/84 (!) 160/93 113/87 (!) 156/66  Pulse: 68 64 (!) 57 (!) 56  Resp: 18 16 19 18   Temp: 98.3 F (36.8 C) 97.7 F (36.5 C) 98.1 F (36.7 C) 98.4 F (36.9 C)  TempSrc: Oral  Oral Oral  SpO2: 100% 100% 100% 100%  Weight: 103.4 kg     Height:        Intake/Output Summary (Last 24 hours) at 08/13/2020 1412 Last data filed at 08/13/2020 0900 Gross per 24 hour  Intake 360 ml  Output -  Net 360 ml   Filed Weights   08/12/20 1113 08/13/20 0137  Weight: 106.6 kg 103.4 kg    Examination:  General exam: Appears calm and comfortable  Respiratory system: Clear to auscultation. Respiratory effort normal. Cardiovascular system: S1 & S2 heard, RRR. No JVD, murmurs, rubs, gallops or clicks. No pedal edema. Gastrointestinal system: Abdomen is nondistended, soft and nontender. No organomegaly or masses felt. Normal bowel sounds heard. Central  nervous system: Alert and oriented x3. No focal neurological deficits. Extremities: Symmetric  Skin: No rashes, lesions or ulcers Psychiatry: Mood & affect appropriate.     Data Reviewed: I have personally reviewed following labs and imaging studies  CBC: Recent Labs  Lab 08/12/20 1117 08/13/20 0009  WBC 6.4 7.1  HGB 12.8 12.2  HCT 38.4 36.2  MCV 94.8 94.3  PLT 214 0000000   Basic Metabolic Panel: Recent Labs  Lab 08/12/20 1117 08/13/20 0009  NA 140 139  K 3.4* 3.3*  CL 103 105  CO2 24 26  GLUCOSE 135* 105*   BUN 20 18  CREATININE 1.42* 1.26*  CALCIUM 9.3 8.7*  MG 2.3  --    GFR: Estimated Creatinine Clearance: 51.4 mL/min (A) (by C-G formula based on SCr of 1.26 mg/dL (H)). Liver Function Tests: Recent Labs  Lab 08/12/20 1117  AST 17  ALT 13  ALKPHOS 73  BILITOT 0.7  PROT 8.3*  ALBUMIN 4.0   No results for input(s): LIPASE, AMYLASE in the last 168 hours. Recent Labs  Lab 08/13/20 0009  AMMONIA 16   Coagulation Profile: No results for input(s): INR, PROTIME in the last 168 hours. Cardiac Enzymes: No results for input(s): CKTOTAL, CKMB, CKMBINDEX, TROPONINI in the last 168 hours. BNP (last 3 results) No results for input(s): PROBNP in the last 8760 hours. HbA1C: No results for input(s): HGBA1C in the last 72 hours. CBG: No results for input(s): GLUCAP in the last 168 hours. Lipid Profile: No results for input(s): CHOL, HDL, LDLCALC, TRIG, CHOLHDL, LDLDIRECT in the last 72 hours. Thyroid Function Tests: Recent Labs    08/13/20 0009  TSH 6.033*   Anemia Panel: Recent Labs    08/12/20 1557  VITAMINB12 286  FOLATE 13.1  FERRITIN 78  TIBC 333  IRON 63  RETICCTPCT 1.3   Sepsis Labs: No results for input(s): PROCALCITON, LATICACIDVEN in the last 168 hours.  Recent Results (from the past 240 hour(s))  SARS CORONAVIRUS 2 (TAT 6-24 HRS) Nasopharyngeal Nasopharyngeal Swab     Status: None   Collection Time: 08/12/20  3:57 PM   Specimen: Nasopharyngeal Swab  Result Value Ref Range Status   SARS Coronavirus 2 NEGATIVE NEGATIVE Final    Comment: (NOTE) SARS-CoV-2 target nucleic acids are NOT DETECTED.  The SARS-CoV-2 RNA is generally detectable in upper and lower respiratory specimens during the acute phase of infection. Negative results do not preclude SARS-CoV-2 infection, do not rule out co-infections with other pathogens, and should not be used as the sole basis for treatment or other patient management decisions. Negative results must be combined with clinical  observations, patient history, and epidemiological information. The expected result is Negative.  Fact Sheet for Patients: SugarRoll.be  Fact Sheet for Healthcare Providers: https://www.woods-mathews.com/  This test is not yet approved or cleared by the [place] FDA and  has been authorized for detection and/or diagnosis of SARS-CoV-2 by FDA under an Emergency Use Authorization (EUA). This EUA will remain  in effect (meaning this test can be used) for the duration of the COVID-19 declaration under Se ction 564(b)(1) of the Act, 21 U.S.C. section 360bbb-3(b)(1), unless the authorization is terminated or revoked sooner.  Performed at [facility], [place] [address] [place], [place] [address]          Radiology Studies: DG Chest 2 View  Result Date: 08/12/2020 CLINICAL DATA:  Weakness. EXAM: CHEST - 2 VIEW COMPARISON:  12/01/2015 FINDINGS: Stable cardiac enlargement. No pleural effusion or edema.  No airspace densities identified. Visualized osseous structures are unremarkable. IMPRESSION: No acute cardiopulmonary abnormalities. Electronically Signed   By: Kerby Moors M.D.   On: 08/12/2020 13:41   CT Head Wo Contrast  Result Date: 08/12/2020 CLINICAL DATA:  Altered mental status. EXAM: CT HEAD WITHOUT CONTRAST TECHNIQUE: Contiguous axial images were obtained from the base of the skull through the vertex without intravenous contrast. COMPARISON:  None. FINDINGS: Brain: Mild chronic ischemic white matter disease is noted. Old right cerebellar infarction is noted. No mass effect or midline shift is noted. Ventricular size is within normal limits. There is no evidence of mass lesion, hemorrhage or acute infarction. Vascular: No hyperdense vessel or unexpected calcification. Skull: Normal. Negative for fracture or focal lesion. Sinuses/Orbits: No acute finding. Other: None. IMPRESSION: Mild chronic ischemic white matter disease. Old right  cerebellar infarction. No acute intracranial abnormality seen. Electronically Signed   By: Marijo Conception M.D.   On: 08/12/2020 13:28   MR ANGIO HEAD WO CONTRAST  Result Date: 08/12/2020 CLINICAL DATA:  Stroke follow-up.  Left frontal lobe infarct. EXAM: MRA HEAD WITHOUT CONTRAST TECHNIQUE: Angiographic images of the Circle of Willis were obtained using MRA technique without intravenous contrast. COMPARISON:  None. FINDINGS: POSTERIOR CIRCULATION: --Vertebral arteries: Normal --Inferior cerebellar arteries: Normal. --Basilar artery: Normal. --Superior cerebellar arteries: Normal. --Posterior cerebral arteries: Normal. ANTERIOR CIRCULATION: --Intracranial internal carotid arteries: Normal. --Anterior cerebral arteries (ACA): Normal. --Middle cerebral arteries (MCA): Normal. ANATOMIC VARIANTS: None IMPRESSION: 1. Normal intracranial MRA. 2. Multifocal magnetic susceptibility effect corresponding to predominantly central chronic microhemorrhages, consistent with chronic hypertensive angiopathy. Electronically Signed   By: Ulyses Jarred M.D.   On: 08/12/2020 23:18   MR BRAIN WO CONTRAST  Addendum Date: 08/12/2020   ADDENDUM REPORT: 08/12/2020 23:19 ADDENDUM: There are approximately 15 scattered foci of chronic microhemorrhage, predominantly central in distribution, most consistent with chronic hypertensive angiopathy. Electronically Signed   By: Ulyses Jarred M.D.   On: 08/12/2020 23:19   Result Date: 08/12/2020 CLINICAL DATA:  Encephalopathy EXAM: MRI HEAD WITHOUT CONTRAST TECHNIQUE: Multiplanar, multiecho pulse sequences of the brain and surrounding structures were obtained without intravenous contrast. COMPARISON:  None. FINDINGS: Brain: Multifocal abnormal diffusion restriction within the left frontal lobe and insula. There is an old right cerebellar infarct. No acute or chronic hemorrhage. Hyperintense T2-weighted signal is moderately widespread throughout the white matter. Generalized volume loss  without a clear lobar predilection. The midline structures are normal. Vascular: Major flow voids are preserved. Skull and upper cervical spine: Normal calvarium and skull base. Visualized upper cervical spine and soft tissues are normal. Sinuses/Orbits:No paranasal sinus fluid levels or advanced mucosal thickening. No mastoid or middle ear effusion. Normal orbits. IMPRESSION: 1. Multifocal acute ischemia within the left frontal lobe and insula. No hemorrhage or mass effect. 2. Old right cerebellar infarct and findings of chronic microvascular disease. Electronically Signed: By: Ulyses Jarred M.D. On: 08/12/2020 20:50   US RENAL  Result Date: 08/13/2020 CLINICAL DATA:  Acute kidney injury EXAM: RENAL / URINARY TRACT ULTRASOUND COMPLETE COMPARISON:  11/30/2015 FINDINGS: Right Kidney: Renal measurements: 11.8 x 5.1 x 4.8 cm = volume: 149 mL. 9 mm cyst in the lower pole. Increased echotexture. No hydronephrosis. Left Kidney: Renal measurements: 11.7 x 5.7 x 4.3 cm = volume: 148 mL. 2.8 cm midpole cyst with thin internal septations. Increased echotexture. No hydronephrosis. Bladder: Appears normal for degree of bladder distention. Other: None. IMPRESSION: Increased echotexture compatible with chronic medical renal disease. No acute findings.  No hydronephrosis. Electronically Signed  By: Rolm Baptise M.D.   On: 08/13/2020 00:03   US Carotid Bilateral  Result Date: 08/13/2020 CLINICAL DATA:  Stroke, hypertension, hyperlipidemia, history of tobacco use EXAM: BILATERAL CAROTID DUPLEX ULTRASOUND TECHNIQUE: Pearline Cables scale imaging, color Doppler and duplex ultrasound were performed of bilateral carotid and vertebral arteries in the neck. COMPARISON:  None. FINDINGS: Criteria: Quantification of carotid stenosis is based on velocity parameters that correlate the residual internal carotid diameter with NASCET-based stenosis levels, using the diameter of the distal internal carotid lumen as the denominator for stenosis  measurement. The following velocity measurements were obtained: RIGHT ICA: 68/19 cm/sec CCA: 22/02 cm/sec SYSTOLIC ICA/CCA RATIO:  0.9 ECA: 92 cm/sec LEFT ICA: 76/22 cm/sec CCA: 54/2 cm/sec SYSTOLIC ICA/CCA RATIO:  0.8 ECA: 111 cm/sec RIGHT CAROTID ARTERY: Eccentric calcified plaque in the distal CCA, bulb, and ICA origin with only mild stenosis. Normal waveforms and color Doppler signal throughout. RIGHT VERTEBRAL ARTERY:  Normal flow direction and waveform. LEFT CAROTID ARTERY: Eccentric partially calcified plaque in the distal common carotid artery, bulb, and proximal ICA with mild stenosis. Normal waveforms and color Doppler signal throughout. Mild tortuosity ICA. LEFT VERTEBRAL ARTERY:  Normal flow direction and waveform. IMPRESSION: 1. Bilateral carotid bifurcation plaque resulting in less than 50% diameter ICA stenosis. 2. Antegrade bilateral vertebral arterial flow. Electronically Signed   By: Lucrezia Europe M.D.   On: 08/13/2020 12:43   ECHOCARDIOGRAM COMPLETE BUBBLE STUDY  Result Date: 08/13/2020    ECHOCARDIOGRAM REPORT   Patient Name:   TALETHA TWIFORD Date of Exam: 08/13/2020 Medical Rec #:  [id number]       Height:       67.0 in Accession #:    [id number]      Weight:       228.0 lb Date of Birth:  [birth year]      BSA:          2.138 m Patient Age:    68 years        BP:           113/87 mmHg Patient Gender: F               HR:           50 bpm. Exam Location:  [facility] Procedure: 2D Echo, Color Doppler, Cardiac Doppler and Saline Contrast Bubble            Study Indications:     I63.9 Stroke  History:         Patient has prior history of Echocardiogram examinations. CAD;                  Risk Factors:Current Smoker, Hypertension and Dyslipidemia.  Sonographer:     Charmayne Sheer RDCS (AE) Referring Phys:  [place] Diagnosing Phys: Bartholome Bill MD  Sonographer Comments: Image acquisition challenging due to patient body habitus. IMPRESSIONS  1. Left ventricular ejection fraction, by estimation, is 70 to  75%. The left ventricle has hyperdynamic function. The left ventricle has no regional wall motion abnormalities. Left ventricular diastolic parameters were normal.  2. Right ventricular systolic function is normal. The right ventricular size is normal.  3. Left atrial size was mildly dilated.  4. The mitral valve is grossly normal. Trivial mitral valve regurgitation.  5. The aortic valve is tricuspid. Aortic valve regurgitation is mild.  6. Agitated saline contrast bubble study was negative, with no evidence of any interatrial shunt. FINDINGS  Left Ventricle: Left ventricular ejection fraction, by estimation, is  70 to 75%. The left ventricle has hyperdynamic function. The left ventricle has no regional wall motion abnormalities. The left ventricular internal cavity size was normal in size. There is no left ventricular hypertrophy. Left ventricular diastolic parameters were normal. Right Ventricle: The right ventricular size is normal. No increase in right ventricular wall thickness. Right ventricular systolic function is normal. Left Atrium: Left atrial size was mildly dilated. Right Atrium: Right atrial size was normal in size. Pericardium: There is no evidence of pericardial effusion. Mitral Valve: The mitral valve is grossly normal. Trivial mitral valve regurgitation. MV peak gradient, 6.2 mmHg. The mean mitral valve gradient is 2.0 mmHg. Tricuspid Valve: The tricuspid valve is not well visualized. Tricuspid valve regurgitation is trivial. Aortic Valve: The aortic valve is tricuspid. Aortic valve regurgitation is mild. Aortic valve mean gradient measures 4.0 mmHg. Aortic valve peak gradient measures 8.0 mmHg. Aortic valve area, by VTI measures 2.39 cm. Pulmonic Valve: The pulmonic valve was not well visualized. Pulmonic valve regurgitation is trivial. Aorta: The aortic root is normal in size and structure. IAS/Shunts: No atrial level shunt detected by color flow Doppler. Agitated saline contrast was given  intravenously to evaluate for intracardiac shunting. Agitated saline contrast bubble study was negative, with no evidence of any interatrial shunt.  LEFT VENTRICLE PLAX 2D LVIDd:         5.28 cm  Diastology LVIDs:         3.18 cm  LV e' medial:    3.15 cm/s LV PW:         1.16 cm  LV E/e' medial:  28.8 LV IVS:        1.08 cm  LV e' lateral:   3.48 cm/s LVOT diam:     2.30 cm  LV E/e' lateral: 26.1 LV SV:         63 LV SV Index:   30 LVOT Area:     4.15 cm  RIGHT VENTRICLE RV Basal diam:  3.30 cm LEFT ATRIUM             Index       RIGHT ATRIUM           Index LA diam:        5.30 cm 2.48 cm/m  RA Area:     11.00 cm LA Vol (A2C):   32.1 ml 15.01 ml/m RA Volume:   23.40 ml  10.95 ml/m LA Vol (A4C):   38.9 ml 18.20 ml/m LA Biplane Vol: 35.1 ml 16.42 ml/m  AORTIC VALVE                   PULMONIC VALVE AV Area (Vmax):    2.41 cm    PV Vmax:       1.13 m/s AV Area (Vmean):   2.37 cm    PV Vmean:      74.800 cm/s AV Area (VTI):     2.39 cm    PV VTI:        0.223 m AV Vmax:           141.00 cm/s PV Peak grad:  5.1 mmHg AV Vmean:          92.100 cm/s PV Mean grad:  3.0 mmHg AV VTI:            0.264 m AV Peak Grad:      8.0 mmHg AV Mean Grad:      4.0 mmHg LVOT Vmax:  81.70 cm/s LVOT Vmean:        52.500 cm/s LVOT VTI:          0.152 m LVOT/AV VTI ratio: 0.58  AORTA Ao Root diam: 3.50 cm MITRAL VALVE MV Area (PHT): 3.15 cm     SHUNTS MV Area VTI:   1.87 cm     Systemic VTI:  0.15 m MV Peak grad:  6.2 mmHg     Systemic Diam: 2.30 cm MV Mean grad:  2.0 mmHg MV Vmax:       1.25 m/s MV Vmean:      60.9 cm/s MV Decel Time: 241 msec MV E velocity: 90.80 cm/s MV A velocity: 119.00 cm/s MV E/A ratio:  0.76 Bartholome Bill MD Electronically signed by Bartholome Bill MD Signature Date/Time: 08/13/2020/12:53:00 PM    Final         Scheduled Meds: . aspirin EC  81 mg Oral Daily  . atorvastatin  40 mg Oral q1800  . carvedilol  6.25 mg Oral BID WC  . cloNIDine  0.2 mg Oral Daily  . cyanocobalamin  1,000 mcg  Subcutaneous Daily  . hydrALAZINE  25 mg Oral BID  . [START ON 08/14/2020] levothyroxine  50 mcg Oral Q0600  . potassium chloride  40 mEq Oral Q2H  . sodium chloride flush       Continuous Infusions: . sodium chloride 250 mL (08/13/20 1301)  . thiamine injection 500 mg (08/13/20 1303)     LOS: 0 days    Time spent: 28 minutes    Sharen Hones, MD [facility]   To contact the attending provider between 7A-7P or the covering provider during after hours 7P-7A, please log into the web site [email or web address] and access using universal [place] password for that web site. If you do not have the password, please call the hospital operator.  08/13/2020, 2:12 PM

## 2020-08-13 NOTE — Progress Notes (Signed)
eeg done °

## 2020-08-13 NOTE — Evaluation (Signed)
Occupational Therapy Evaluation Patient Details Name: Patricia Ewing MRN: [id number] DOB: 02/02/1951 Today's Date: 08/13/2020    History of Present Illness Pt is a 71 yo female that presented to ED for confusion. PMH of HTNm HLD, afib, glaucoma and tobacco use. MRI reveals "multifocal acute infarctions within the left frontal lobe and insula. Old right cerebellar infarct and findings of chronic microvascular disease as well as multifocal predominantly central chronic microhemorrhages, consistent with chronic hypertensive angiopathy."   Clinical Impression   Patricia Ewing was seen for OT evaluation this date. Prior to hospital admission, pt was MOD I using SPC for community mobility and ADLs. Pt lives in 1 level home c husband available near 24/7 (unable to provide significant physical assist). Pt presents to acute OT demonstrating impaired ADL performance and functional mobility 2/2 poor insight into deficits, decreased activity tolerance, functional balance/strength deficits, and decreased safety awareness. Pt unable to state correct month/year/location without significant cueing, demonstrates frustration with expressive difficulties. Pt husband at bedside to provide corrections to home setup 2/2 expressive difficulties.  Pt currently requires SUPERVISION for bed mobility. MIN A for LBD seated EOB. MIN VCs for tooth brushing seated EOC. CGA + SPC for simulated BSC t/f. Pt would benefit from skilled OT to address noted impairments and functional limitations (see below for any additional details) in order to maximize safety and independence while minimizing falls risk and caregiver burden. Upon hospital discharge, recommend HHOT to maximize pt safety and return to functional independence during meaningful occupations of daily life.     Follow Up Recommendations  Home health OT;Supervision/Assistance - 24 hour    Equipment Recommendations  3 in 1 bedside commode    Recommendations for Other Services        Precautions / Restrictions Precautions Precautions: Fall Restrictions Weight Bearing Restrictions: No      Mobility Bed Mobility Overal bed mobility: Needs Assistance Bed Mobility: Supine to Sit     Supine to sit: Supervision;HOB elevated     General bed mobility comments: Increased time and rest breaks to exit L side of bed    Transfers Overall transfer level: Needs assistance Equipment used: Straight cane Transfers: Sit to/from Stand;Stand Pivot Transfers Sit to Stand: Min assist Stand pivot transfers: Min guard            Balance Overall balance assessment: Needs assistance Sitting-balance support: No upper extremity supported;Feet supported Sitting balance-Leahy Scale: Good     Standing balance support: Single extremity supported Standing balance-Leahy Scale: Fair Standing balance comment: furniture walking - pt requires BUE support                           ADL either performed or assessed with clinical judgement   ADL Overall ADL's : Needs assistance/impaired                                       General ADL Comments: MIN A for LBD seated EOB. MIN VCs for tooth brushing seated EOC. CGA + SPC for simulated BSC t/f                  Pertinent Vitals/Pain Pain Assessment: No/denies pain     Hand Dominance     Extremity/Trunk Assessment Upper Extremity Assessment Upper Extremity Assessment: Generalized weakness   Lower Extremity Assessment Lower Extremity Assessment: Generalized weakness  Communication Communication Communication: Expressive difficulties   Cognition Arousal/Alertness: Awake/alert Behavior During Therapy: Flat affect Overall Cognitive Status: Impaired/Different from baseline Area of Impairment: Orientation;Attention;Memory;Following commands;Safety/judgement;Problem solving                 Orientation Level: Disoriented to;Time Current Attention Level: Selective Memory:  Decreased recall of precautions;Decreased short-term memory Following Commands: Follows one step commands inconsistently;Follows one step commands with increased time Safety/Judgement: Decreased awareness of safety;Decreased awareness of deficits   Problem Solving: Slow processing;Decreased initiation;Requires verbal cues;Difficulty sequencing;Requires tactile cues     General Comments       Exercises Exercises: Other exercises Other Exercises Other Exercises: Pt educated re: OT role, DME recs, d/c recs, falls prevention, ECS Other Exercises: LBD, grooming, sup>sit, sit<>stand, sitting/standing balance/tolerance   Shoulder Instructions      Home Living Family/patient expects to be discharged to:: Private residence Living Arrangements: Spouse/significant other Available Help at Discharge: Family;Available 24 hours/day (husband is retired, reports no other family/friends for assistance. Husband states he has leukemia and cannot provide significant physical assist) Type of Home: House Home Access: Stairs to enter [employer] of Steps: 2   Home Layout: One level     Bathroom Shower/Tub: Tub/shower unit         Home Equipment: Cane - single point          Prior Functioning/Environment Level of Independence: Independent with assistive device(s)        Comments: MOD I using SPC for mobility and ADLs        OT Problem List: Decreased activity tolerance;Impaired balance (sitting and/or standing);Decreased cognition;Decreased safety awareness      OT Treatment/Interventions: Self-care/ADL training;Therapeutic exercise;Energy conservation;DME and/or AE instruction;Therapeutic activities;Patient/family education;Balance training    OT Goals(Current goals can be found in the care plan section) Acute Rehab OT Goals Patient Stated Goal: To return home OT Goal Formulation: With patient/family Time For Goal Achievement: 08/27/20 Potential to Achieve Goals: Good ADL  Goals Pt Will Perform Grooming: with modified independence;sitting (c LRAD PRN) Pt Will Perform Lower Body Dressing: with modified independence;sit to/from stand (c LRAD PRN) Pt Will Transfer to Toilet: with modified independence;ambulating;regular height toilet (c LRAD PRN)  OT Frequency: Min 2X/week    AM-PAC OT "6 Clicks" Daily Activity     Outcome Measure Help from another person eating meals?: A Little Help from another person taking care of personal grooming?: A Little Help from another person toileting, which includes using toliet, bedpan, or urinal?: A Little Help from another person bathing (including washing, rinsing, drying)?: A Little Help from another person to put on and taking off regular upper body clothing?: A Little Help from another person to put on and taking off regular lower body clothing?: A Little 6 Click Score: 18   End of Session    Activity Tolerance: Patient tolerated treatment well Patient left: in chair;with call bell/phone within reach;with family/visitor present;Other (comment) (PT in room end of session)  OT Visit Diagnosis: Other abnormalities of gait and mobility (R26.89)                Time: 4098-1191 OT Time Calculation (min): 17 min Charges:  OT General Charges $OT Visit: 1 Visit OT Evaluation $OT Eval Moderate Complexity: 1 Mod OT Treatments $Self Care/Home Management : 8-22 mins  Dessie Coma, M.S. OTR/L  08/13/20, 3:31 PM  ascom [phone]

## 2020-08-13 NOTE — Evaluation (Signed)
Physical Therapy Evaluation Patient Details Name: Patricia Ewing MRN: [id number] DOB: [birth year] Today's Date: 08/13/2020   History of Present Illness  Pt is a 70 yo female that presented to ED for confusion. PMH of HTNm HLD, afib, glaucoma and tobacco use. MRI reveals "multifocal acute infarctions within the left frontal lobe and insula. Old right cerebellar infarct and findings of chronic microvascular disease as well as multifocal predominantly central chronic microhemorrhages, consistent with chronic hypertensive angiopathy."    Clinical Impression  Patient alert, in chair with OT at bedside. Per pt and OT, pt modI with SPC for community ambulation, was driving.   Pt performed sit <> stand with SPC and with RW, improved safety with transfers and ambulation with RW noted, pt agreeable to utilize during hospital and at discharge. CGA for ambulation ~148ft. Pt exhibited shuffled gait, wide base of support, decreased step height/length bilaterally, very decreased gait velocity. Pt endorsed fatigue after ambulation, no LOB noted throughout mobility. Family endorsed improved safety with RW compared to baseline mobility as well.  Overall the patient demonstrated deficits (see "PT Problem List") that impede the patient's functional abilities, safety, and mobility and would benefit from skilled PT intervention. Recommendation is HHPT with supervision/mobility.   Strength: MMT Screening revealed no focal impairments, symmetrical strength. Difficulty with MMT task but able to complete with multimodal cueing and extended time Joint Proprioception: deferred  Light Touch Sensation: deferred Dysmetria: Finger to nose symmetrical, appears WNL Digital opposition in sequence: equal bilat, no fine motor ataxia or target errors Eye Tracking: smooth pursuits intact left/right;  saccadic movements: WFLs Speech: WNL        Follow Up Recommendations Home health PT;Supervision for mobility/OOB    Equipment  Recommendations  Rolling walker with 5" wheels    Recommendations for Other Services       Precautions / Restrictions Precautions Precautions: Fall Restrictions Weight Bearing Restrictions: No      Mobility  Bed Mobility Overal bed mobility: Needs Assistance Bed Mobility: Supine to Sit     Supine to sit: Supervision;HOB elevated     General bed mobility comments: Per OT pt was supervision to mobilize to EOB    Transfers Overall transfer level: Needs assistance Equipment used: Straight cane;Rolling walker (2 wheeled) Transfers: Sit to/from Stand Sit to Stand: Supervision Stand pivot transfers: Min guard          Ambulation/Gait   Gait Distance (Feet): 100 Feet Assistive device: Rolling walker (2 wheeled)       General Gait Details: shuffled gait, wide base of support, decreased step height/length bilaterally, very slow. Pt endorsed fatigue after ambulation  Stairs            Wheelchair Mobility    Modified Rankin (Stroke Patients Only)       Balance Overall balance assessment: Needs assistance Sitting-balance support: No upper extremity supported;Feet supported Sitting balance-Leahy Scale: Good     Standing balance support: Single extremity supported Standing balance-Leahy Scale: Fair Standing balance comment: furniture walking - pt requires BUE support;  improved safety with RW                             Pertinent Vitals/Pain Pain Assessment: No/denies pain    Home Living Family/patient expects to be discharged to:: Private residence Living Arrangements: Spouse/significant other Available Help at Discharge: Family;Available 24 hours/day (husband is retired, reports no other family/friends for assistance. Husband states he has leukemia and cannot provide significant physical  assist) Type of Home: House Home Access: Stairs to enter   [employer] of Steps: 2 Home Layout: One level Home Equipment: Cane - single  point Additional Comments: PLOF from OT and pt as needed    Prior Function Level of Independence: Independent with assistive device(s)         Comments: MOD I using SPC for mobility and ADLs     Hand Dominance   Dominant Hand:  (pt reported that she is ambidextrous)    Extremity/Trunk Assessment   Upper Extremity Assessment Upper Extremity Assessment: RUE deficits/detail;LUE deficits/detail RUE Deficits / Details: WFLs grossly 4/5 RUE Coordination: WNL LUE Deficits / Details: WFLs grossly 4/5 LUE Coordination: WNL    Lower Extremity Assessment Lower Extremity Assessment: RLE deficits/detail;LLE deficits/detail RLE Deficits / Details: grossly 4/5, difficulty with understanding MMT task but able to participate with multimodal cueing and extended time RLE Coordination: WNL LLE Deficits / Details: grossly 4/5, difficulty with understanding MMT task but able to participate with multimodal cueing and extended time LLE Coordination: WNL       Communication   Communication: Expressive difficulties  Cognition Arousal/Alertness: Awake/alert Behavior During Therapy: Flat affect Overall Cognitive Status: Impaired/Different from baseline Area of Impairment: Orientation;Attention;Memory;Following commands;Safety/judgement;Problem solving                 Orientation Level: Disoriented to;Time Current Attention Level: Selective Memory: Decreased recall of precautions;Decreased short-term memory Following Commands: Follows one step commands inconsistently;Follows one step commands with increased time Safety/Judgement: Decreased awareness of safety;Decreased awareness of deficits   Problem Solving: Slow processing;Decreased initiation;Requires verbal cues;Difficulty sequencing;Requires tactile cues        General Comments      Exercises Other Exercises Other Exercises: Pt and family educated on proper sit <> stand mechanics with RW to maximize safety Other Exercises: Pt  vision assessed; tracking and convergence WFLs   Assessment/Plan    PT Assessment Patient needs continued PT services  PT Problem List Decreased mobility;Decreased safety awareness;Decreased activity tolerance;Decreased balance;Decreased knowledge of use of DME       PT Treatment Interventions DME instruction;Therapeutic exercise;Gait training;Balance training;Stair training;Functional mobility training;Therapeutic activities;Neuromuscular re-education;Patient/family education    PT Goals (Current goals can be found in the Care Plan section)  Acute Rehab PT Goals Patient Stated Goal: to go home PT Goal Formulation: With patient Time For Goal Achievement: 08/27/20 Potential to Achieve Goals: Good    Frequency 7X/week   Barriers to discharge        Co-evaluation               AM-PAC PT "6 Clicks" Mobility  Outcome Measure Help needed turning from your back to your side while in a flat bed without using bedrails?: None Help needed moving from lying on your back to sitting on the side of a flat bed without using bedrails?: None Help needed moving to and from a bed to a chair (including a wheelchair)?: None Help needed standing up from a chair using your arms (e.g., wheelchair or bedside chair)?: None Help needed to walk in hospital room?: A Little Help needed climbing 3-5 steps with a railing? : A Little 6 Click Score: 22    End of Session Equipment Utilized During Treatment: Gait belt Activity Tolerance: Patient tolerated treatment well Patient left: in chair;with chair alarm set;with call bell/phone within reach Nurse Communication: Mobility status PT Visit Diagnosis: Other abnormalities of gait and mobility (R26.89);Muscle weakness (generalized) (M62.81);Difficulty in walking, not elsewhere classified (R26.2)    Time:  4580-9983 PT Time Calculation (min) (ACUTE ONLY): 26 min   Charges:   PT Evaluation $PT Eval Low Complexity: 1 Low PT Treatments $Therapeutic  Exercise: 8-22 mins        Lieutenant Diego PT, DPT 4:03 PM,08/13/20

## 2020-08-13 NOTE — Procedures (Signed)
Patient Name: Patricia Ewing  MRN: [id number]  Epilepsy Attending: Lora Havens  Referring Physician/Provider: Dr Rupert Stacks Date: 08/13/2020 Duration: 22.36 mins  Patient history: 70 year old female with a 3-4 week history ofcognitive decline. EEG to evaluate for seizure  Level of alertness: Awake  AEDs during EEG study: None  Technical aspects: This EEG study was done with scalp electrodes positioned according to the 10-20 International system of electrode placement. Electrical activity was acquired at a sampling rate of 500Hz  and reviewed with a high frequency filter of 70Hz  and a low frequency filter of 1Hz . EEG data were recorded continuously and digitally stored.   Description: The posterior dominant rhythm consists of 8 Hz activity of moderate voltage (25-35 uV) seen predominantly in posterior head regions, symmetric and reactive to eye opening and eye closing. EEG showed intermittent left frontotemporal 3 to 6 Hz theta-delta slowing. Physiologic photic driving was not seen during photic stimulation. Hyperventilation was not performed.     ABNORMALITY -Intermittent slow, left frontotemporal  region  IMPRESSION: This study is suggestive cortical dysfunction in left frontotemporal  Region likely secondary to underlying stroke. No seizures or epileptiform discharges were seen throughout the recording.  Imer Foxworth Barbra Sarks

## 2020-08-13 NOTE — Progress Notes (Signed)
CSW acknowledges consult for "home health or SNF". Please place PT/OT consults when appropriate and [facility] staff will follow up based on their recommendations.  Oleh Genin, [place]

## 2020-08-14 DIAGNOSIS — N1832 Chronic kidney disease, stage 3b: Secondary | ICD-10-CM

## 2020-08-14 LAB — CBC WITH DIFFERENTIAL/PLATELET
Abs Immature Granulocytes: 0.02 10*3/uL (ref 0.00–0.07)
Basophils Absolute: 0 10*3/uL (ref 0.0–0.1)
Basophils Relative: 0 %
Eosinophils Absolute: 0 10*3/uL (ref 0.0–0.5)
Eosinophils Relative: 1 %
HCT: 36.8 % (ref 36.0–46.0)
Hemoglobin: 12.4 g/dL (ref 12.0–15.0)
Immature Granulocytes: 0 %
Lymphocytes Relative: 27 %
Lymphs Abs: 1.6 10*3/uL (ref 0.7–4.0)
MCH: 31.9 pg (ref 26.0–34.0)
MCHC: 33.7 g/dL (ref 30.0–36.0)
MCV: 94.6 fL (ref 80.0–100.0)
Monocytes Absolute: 0.5 10*3/uL (ref 0.1–1.0)
Monocytes Relative: 8 %
Neutro Abs: 3.9 10*3/uL (ref 1.7–7.7)
Neutrophils Relative %: 64 %
Platelets: 189 10*3/uL (ref 150–400)
RBC: 3.89 MIL/uL (ref 3.87–5.11)
RDW: 13 % (ref 11.5–15.5)
WBC: 6.1 10*3/uL (ref 4.0–10.5)
nRBC: 0 % (ref 0.0–0.2)

## 2020-08-14 LAB — BASIC METABOLIC PANEL
Anion gap: 8 (ref 5–15)
BUN: 15 mg/dL (ref 8–23)
CO2: 27 mmol/L (ref 22–32)
Calcium: 9.1 mg/dL (ref 8.9–10.3)
Chloride: 106 mmol/L (ref 98–111)
Creatinine, Ser: 1.37 mg/dL — ABNORMAL HIGH (ref 0.44–1.00)
GFR, Estimated: 42 mL/min — ABNORMAL LOW (ref >60)
Glucose, Bld: 107 mg/dL — ABNORMAL HIGH (ref 70–99)
Potassium: 3.9 mmol/L (ref 3.5–5.1)
Sodium: 141 mmol/L (ref 135–145)

## 2020-08-14 LAB — ANA COMPREHENSIVE PANEL

## 2020-08-14 LAB — MAGNESIUM: Magnesium: 2.2 mg/dL (ref 1.7–2.4)

## 2020-08-14 MED ORDER — DORZOLAMIDE HCL-TIMOLOL MAL 2-0.5 % OP SOLN
1.0000 [drp] | Freq: Two times a day (BID) | OPHTHALMIC | Status: DC
  Administered 2020-08-14 – 2020-08-18 (×8): 1 [drp] via OPHTHALMIC
  Filled 2020-08-14: qty 10

## 2020-08-14 MED ORDER — NETARSUDIL-LATANOPROST 0.02-0.005 % OP SOLN: 1.0000 [drp] | Freq: Two times a day (BID) | OPHTHALMIC | Status: DC

## 2020-08-14 MED ORDER — TIMOLOL MALEATE 0.5 % OP SOLN
1.0000 [drp] | Freq: Two times a day (BID) | OPHTHALMIC | Status: DC
  Administered 2020-08-14 – 2020-08-18 (×8): 1 [drp] via OPHTHALMIC
  Filled 2020-08-14: qty 5

## 2020-08-14 MED ORDER — AMLODIPINE BESYLATE 5 MG PO TABS
5.0000 mg | ORAL_TABLET | Freq: Every day | ORAL | Status: DC
  Administered 2020-08-14: 5 mg via ORAL
  Filled 2020-08-14: qty 1

## 2020-08-14 NOTE — Progress Notes (Addendum)
PROGRESS NOTE    Patricia Ewing  [id number] DOB: 06-23-1951 DOA: 08/12/2020 PCP: Baxter Hire, MD   Chief complaint.  Altered mental status. Brief Narrative:  Patricia Senesac Jonesis a 70 y.o.femalewith medical history significant forcataracts status post cataract extraction and left,coronary artery disease,pretension, atherosclerosis of abdominal aorta, history of NSTEMI, hyperlipidemia, atrial fibrillation, presented to the emergency department for chief concerns of confusion. MRI of the brain showed multifocal acute ischemia within the left frontal lobe and insula. She also has significant angiopathy. She has been seen by neurology, she also has overlapping acute thiamine deficiency. Started on high-dose of thiamine for 3 days. She has B12 deficiency, started subcu injection for 7 days.   Assessment & Plan:   Principal Problem:   Altered mental status Active Problems:   HTN (hypertension)   Obesity (BMI 30-39.9)   AKI (acute kidney injury) ([place])   Acute arterial ischemic stroke, multifocal, anterior circulation, left ([place])   #1. Acute ischemic stroke in the left frontal lobe. Chronic hypertensive angiopathy. Hypertension encephalopathy. Appreciated neurology consulted.  Continue Lipitor and aspirin.   Blood pressure still running high, added Norvasc.  2.  Thiamine deficiency. Patient be receiving IV thiamine-high-dose for 3 days.  #3.  Vitamin B12 deficiency. Continue subcu injection for B12.  4.  Hypothyroidism. Continue Synthroid.  5.  Chronic kidney disease stage IIIb. Hypokalemia.. Reviewed BMP performed on 03/11/2020 from [place], creatinine level 1.5.  She is at her baseline.  She does not have AKI.  Potassium has normalized.  #6.  Elevated troponin. Secondary to demand ischemia from stroke and hypertension encephalopathy. Reviewed echocardiogram, hyperdynamic left ventricle with ejection fraction 70 to 75%.  No severe valvular  disease.  DVT prophylaxis: Lovenox Code Status: Full Family Communication: husband updated Disposition Plan:  .   Status is: Inpatient  Remains inpatient appropriate because:IV treatments appropriate due to intensity of illness or inability to take PO   Dispo: The patient is from: Home              Anticipated d/c is to: Home              Anticipated d/c date is: 2 days              Patient currently is not medically stable to d/c.        I/O last 3 completed shifts: In: 849.4 [P.O.:600; I.V.:48.5; IV Piggyback:200.9] Out: 900 [Urine:900] No intake/output data recorded.     Consultants:   Neurology  Procedures: None  Antimicrobials:None  Subjective: Patient feels well today, she still has some weakness, no headache or dizziness. No abdominal pain or nausea vomiting. No fever or chills. No dysuria or hematuria.  Objective: Vitals:   08/13/20 1538 08/13/20 2208 08/14/20 0119 08/14/20 0832  BP: (!) 153/89 (!) 184/105 (!) 176/86 (!) 194/102  Pulse: 63 69 70 83  Resp: 17 18 17 17   Temp: 98.9 F (37.2 C) 98.4 F (36.9 C) 98.3 F (36.8 C) 98.7 F (37.1 C)  TempSrc:      SpO2: 100% 100% 98% 99%  Weight:      Height:        Intake/Output Summary (Last 24 hours) at 08/14/2020 1012 Last data filed at 08/14/2020 0555 Gross per 24 hour  Intake 438.55 ml  Output 900 ml  Net -461.45 ml   Filed Weights   08/12/20 1113 08/13/20 0137  Weight: 106.6 kg 103.4 kg    Examination:  General exam: Appears calm and  comfortable  Respiratory system: Clear to auscultation. Respiratory effort normal. Cardiovascular system: S1 & S2 heard, RRR. No JVD, murmurs, rubs, gallops or clicks. No pedal edema. Gastrointestinal system: Abdomen is nondistended, soft and nontender. No organomegaly or masses felt. Normal bowel sounds heard. Central nervous system: Alert and oriented x3. No focal neurological deficits. Extremities: Symmetric 5 x 5 power. Skin: No rashes, lesions or  ulcers Psychiatry: Judgement and insight appear normal. Mood & affect appropriate.     Data Reviewed: I have personally reviewed following labs and imaging studies  CBC: Recent Labs  Lab 08/12/20 1117 08/13/20 0009 08/14/20 0440  WBC 6.4 7.1 6.1  NEUTROABS  --   --  3.9  HGB 12.8 12.2 12.4  HCT 38.4 36.2 36.8  MCV 94.8 94.3 94.6  PLT 214 188 [id number]   Basic Metabolic Panel: Recent Labs  Lab 08/12/20 1117 08/13/20 0009 08/14/20 0440  NA 140 139 141  K 3.4* 3.3* 3.9  CL 103 105 106  CO2 24 26 27   GLUCOSE 135* 105* 107*  BUN 20 18 15   CREATININE 1.42* 1.26* 1.37*  CALCIUM 9.3 8.7* 9.1  MG 2.3  --  2.2   GFR: Estimated Creatinine Clearance: 47.2 mL/min (A) (by C-G formula based on SCr of 1.37 mg/dL (H)). Liver Function Tests: Recent Labs  Lab 08/12/20 1117  AST 17  ALT 13  ALKPHOS 73  BILITOT 0.7  PROT 8.3*  ALBUMIN 4.0   No results for input(s): LIPASE, AMYLASE in the last 168 hours. Recent Labs  Lab 08/13/20 0009  AMMONIA 16   Coagulation Profile: No results for input(s): INR, PROTIME in the last 168 hours. Cardiac Enzymes: No results for input(s): CKTOTAL, CKMB, CKMBINDEX, TROPONINI in the last 168 hours. BNP (last 3 results) No results for input(s): PROBNP in the last 8760 hours. HbA1C: No results for input(s): HGBA1C in the last 72 hours. CBG: No results for input(s): GLUCAP in the last 168 hours. Lipid Profile: No results for input(s): CHOL, HDL, LDLCALC, TRIG, CHOLHDL, LDLDIRECT in the last 72 hours. Thyroid Function Tests: Recent Labs    08/13/20 0009  TSH 6.033*   Anemia Panel: Recent Labs    08/12/20 1557  VITAMINB12 286  FOLATE 13.1  FERRITIN 78  TIBC 333  IRON 63  RETICCTPCT 1.3   Sepsis Labs: No results for input(s): PROCALCITON, LATICACIDVEN in the last 168 hours.  Recent Results (from the past 240 hour(s))  SARS CORONAVIRUS 2 (TAT 6-24 HRS) Nasopharyngeal Nasopharyngeal Swab     Status: None   Collection Time: 08/12/20   3:57 PM   Specimen: Nasopharyngeal Swab  Result Value Ref Range Status   SARS Coronavirus 2 NEGATIVE NEGATIVE Final    Comment: (NOTE) SARS-CoV-2 target nucleic acids are NOT DETECTED.  The SARS-CoV-2 RNA is generally detectable in upper and lower respiratory specimens during the acute phase of infection. Negative results do not preclude SARS-CoV-2 infection, do not rule out co-infections with other pathogens, and should not be used as the sole basis for treatment or other patient management decisions. Negative results must be combined with clinical observations, patient history, and epidemiological information. The expected result is Negative.  Fact Sheet for Patients: SugarRoll.be  Fact Sheet for Healthcare Providers: https://www.woods-mathews.com/  This test is not yet approved or cleared by the [place] FDA and  has been authorized for detection and/or diagnosis of SARS-CoV-2 by FDA under an Emergency Use Authorization (EUA). This EUA will remain  in effect (meaning this test can be used)  for the duration of the COVID-19 declaration under Se ction 564(b)(1) of the Act, 21 U.S.C. section 360bbb-3(b)(1), unless the authorization is terminated or revoked sooner.  Performed at [facility], [place] [address], [place], [place] [address]          Radiology Studies: EEG  Result Date: 08/13/2020 Lora Havens, MD     08/13/2020  4:03 PM Patient Name: Patricia Ewing MRN: [id number] Epilepsy Attending: Lora Havens Referring Physician/Provider: Dr Rupert Stacks Date: 08/13/2020 Duration: 22.36 mins Patient history: 70 year old female with a 3-4 week history ofcognitive decline. EEG to evaluate for seizure Level of alertness: Awake AEDs during EEG study: None Technical aspects: This EEG study was done with scalp electrodes positioned according to the 10-20 International system of electrode placement. Electrical activity was acquired  at a sampling rate of 500Hz  and reviewed with a high frequency filter of 70Hz  and a low frequency filter of 1Hz . EEG data were recorded continuously and digitally stored. Description: The posterior dominant rhythm consists of 8 Hz activity of moderate voltage (25-35 uV) seen predominantly in posterior head regions, symmetric and reactive to eye opening and eye closing. EEG showed intermittent left frontotemporal 3 to 6 Hz theta-delta slowing. Physiologic photic driving was not seen during photic stimulation. Hyperventilation was not performed.   ABNORMALITY -Intermittent slow, left frontotemporal  region IMPRESSION: This study is suggestive cortical dysfunction in left frontotemporal  Region likely secondary to underlying stroke. No seizures or epileptiform discharges were seen throughout the recording. Lora Havens   DG Chest 2 View  Result Date: 08/12/2020 CLINICAL DATA:  Weakness. EXAM: CHEST - 2 VIEW COMPARISON:  12/01/2015 FINDINGS: Stable cardiac enlargement. No pleural effusion or edema. No airspace densities identified. Visualized osseous structures are unremarkable. IMPRESSION: No acute cardiopulmonary abnormalities. Electronically Signed   By: Kerby Moors M.D.   On: 08/12/2020 13:41   CT Head Wo Contrast  Result Date: 08/12/2020 CLINICAL DATA:  Altered mental status. EXAM: CT HEAD WITHOUT CONTRAST TECHNIQUE: Contiguous axial images were obtained from the base of the skull through the vertex without intravenous contrast. COMPARISON:  None. FINDINGS: Brain: Mild chronic ischemic white matter disease is noted. Old right cerebellar infarction is noted. No mass effect or midline shift is noted. Ventricular size is within normal limits. There is no evidence of mass lesion, hemorrhage or acute infarction. Vascular: No hyperdense vessel or unexpected calcification. Skull: Normal. Negative for fracture or focal lesion. Sinuses/Orbits: No acute finding. Other: None. IMPRESSION: Mild chronic ischemic  white matter disease. Old right cerebellar infarction. No acute intracranial abnormality seen. Electronically Signed   By: Marijo Conception M.D.   On: 08/12/2020 13:28   MR ANGIO HEAD WO CONTRAST  Result Date: 08/12/2020 CLINICAL DATA:  Stroke follow-up.  Left frontal lobe infarct. EXAM: MRA HEAD WITHOUT CONTRAST TECHNIQUE: Angiographic images of the Circle of Willis were obtained using MRA technique without intravenous contrast. COMPARISON:  None. FINDINGS: POSTERIOR CIRCULATION: --Vertebral arteries: Normal --Inferior cerebellar arteries: Normal. --Basilar artery: Normal. --Superior cerebellar arteries: Normal. --Posterior cerebral arteries: Normal. ANTERIOR CIRCULATION: --Intracranial internal carotid arteries: Normal. --Anterior cerebral arteries (ACA): Normal. --Middle cerebral arteries (MCA): Normal. ANATOMIC VARIANTS: None IMPRESSION: 1. Normal intracranial MRA. 2. Multifocal magnetic susceptibility effect corresponding to predominantly central chronic microhemorrhages, consistent with chronic hypertensive angiopathy. Electronically Signed   By: Ulyses Jarred M.D.   On: 08/12/2020 23:18   MR BRAIN WO CONTRAST  Addendum Date: 08/12/2020   ADDENDUM REPORT: 08/12/2020 23:19 ADDENDUM: There are  approximately 15 scattered foci of chronic microhemorrhage, predominantly central in distribution, most consistent with chronic hypertensive angiopathy. Electronically Signed   By: Ulyses Jarred M.D.   On: 08/12/2020 23:19   Result Date: 08/12/2020 CLINICAL DATA:  Encephalopathy EXAM: MRI HEAD WITHOUT CONTRAST TECHNIQUE: Multiplanar, multiecho pulse sequences of the brain and surrounding structures were obtained without intravenous contrast. COMPARISON:  None. FINDINGS: Brain: Multifocal abnormal diffusion restriction within the left frontal lobe and insula. There is an old right cerebellar infarct. No acute or chronic hemorrhage. Hyperintense T2-weighted signal is moderately widespread throughout the white  matter. Generalized volume loss without a clear lobar predilection. The midline structures are normal. Vascular: Major flow voids are preserved. Skull and upper cervical spine: Normal calvarium and skull base. Visualized upper cervical spine and soft tissues are normal. Sinuses/Orbits:No paranasal sinus fluid levels or advanced mucosal thickening. No mastoid or middle ear effusion. Normal orbits. IMPRESSION: 1. Multifocal acute ischemia within the left frontal lobe and insula. No hemorrhage or mass effect. 2. Old right cerebellar infarct and findings of chronic microvascular disease. Electronically Signed: By: Ulyses Jarred M.D. On: 08/12/2020 20:50   US RENAL  Result Date: 08/13/2020 CLINICAL DATA:  Acute kidney injury EXAM: RENAL / URINARY TRACT ULTRASOUND COMPLETE COMPARISON:  11/30/2015 FINDINGS: Right Kidney: Renal measurements: 11.8 x 5.1 x 4.8 cm = volume: 149 mL. 9 mm cyst in the lower pole. Increased echotexture. No hydronephrosis. Left Kidney: Renal measurements: 11.7 x 5.7 x 4.3 cm = volume: 148 mL. 2.8 cm midpole cyst with thin internal septations. Increased echotexture. No hydronephrosis. Bladder: Appears normal for degree of bladder distention. Other: None. IMPRESSION: Increased echotexture compatible with chronic medical renal disease. No acute findings.  No hydronephrosis. Electronically Signed   By: Rolm Baptise M.D.   On: 08/13/2020 00:03   US Carotid Bilateral  Result Date: 08/13/2020 CLINICAL DATA:  Stroke, hypertension, hyperlipidemia, history of tobacco use EXAM: BILATERAL CAROTID DUPLEX ULTRASOUND TECHNIQUE: Pearline Cables scale imaging, color Doppler and duplex ultrasound were performed of bilateral carotid and vertebral arteries in the neck. COMPARISON:  None. FINDINGS: Criteria: Quantification of carotid stenosis is based on velocity parameters that correlate the residual internal carotid diameter with NASCET-based stenosis levels, using the diameter of the distal internal carotid lumen as  the denominator for stenosis measurement. The following velocity measurements were obtained: RIGHT ICA: 68/19 cm/sec CCA: 0000000 cm/sec SYSTOLIC ICA/CCA RATIO:  0.9 ECA: 92 cm/sec LEFT ICA: 76/22 cm/sec CCA: [id number] cm/sec SYSTOLIC ICA/CCA RATIO:  0.8 ECA: 111 cm/sec RIGHT CAROTID ARTERY: Eccentric calcified plaque in the distal CCA, bulb, and ICA origin with only mild stenosis. Normal waveforms and color Doppler signal throughout. RIGHT VERTEBRAL ARTERY:  Normal flow direction and waveform. LEFT CAROTID ARTERY: Eccentric partially calcified plaque in the distal common carotid artery, bulb, and proximal ICA with mild stenosis. Normal waveforms and color Doppler signal throughout. Mild tortuosity ICA. LEFT VERTEBRAL ARTERY:  Normal flow direction and waveform. IMPRESSION: 1. Bilateral carotid bifurcation plaque resulting in less than 50% diameter ICA stenosis. 2. Antegrade bilateral vertebral arterial flow. Electronically Signed   By: Lucrezia Europe M.D.   On: 08/13/2020 12:43   ECHOCARDIOGRAM COMPLETE BUBBLE STUDY  Result Date: 08/13/2020    ECHOCARDIOGRAM REPORT   Patient Name:   DANYLAH FORRISTER Date of Exam: 08/13/2020 Medical Rec #:  [id number]       Height:       67.0 in Accession #:    [id number]      Weight:  228.0 lb Date of Birth:  19-Nov-1950      BSA:          2.138 m Patient Age:    35 years        BP:           113/87 mmHg Patient Gender: F               HR:           50 bpm. Exam Location:  [facility] Procedure: 2D Echo, Color Doppler, Cardiac Doppler and Saline Contrast Bubble            Study Indications:     I63.9 Stroke  History:         Patient has prior history of Echocardiogram examinations. CAD;                  Risk Factors:Current Smoker, Hypertension and Dyslipidemia.  Sonographer:     Charmayne Sheer RDCS (AE) Referring Phys:  [place] Diagnosing Phys: Bartholome Bill MD  Sonographer Comments: Image acquisition challenging due to patient body habitus. IMPRESSIONS  1. Left ventricular ejection  fraction, by estimation, is 70 to 75%. The left ventricle has hyperdynamic function. The left ventricle has no regional wall motion abnormalities. Left ventricular diastolic parameters were normal.  2. Right ventricular systolic function is normal. The right ventricular size is normal.  3. Left atrial size was mildly dilated.  4. The mitral valve is grossly normal. Trivial mitral valve regurgitation.  5. The aortic valve is tricuspid. Aortic valve regurgitation is mild.  6. Agitated saline contrast bubble study was negative, with no evidence of any interatrial shunt. FINDINGS  Left Ventricle: Left ventricular ejection fraction, by estimation, is 70 to 75%. The left ventricle has hyperdynamic function. The left ventricle has no regional wall motion abnormalities. The left ventricular internal cavity size was normal in size. There is no left ventricular hypertrophy. Left ventricular diastolic parameters were normal. Right Ventricle: The right ventricular size is normal. No increase in right ventricular wall thickness. Right ventricular systolic function is normal. Left Atrium: Left atrial size was mildly dilated. Right Atrium: Right atrial size was normal in size. Pericardium: There is no evidence of pericardial effusion. Mitral Valve: The mitral valve is grossly normal. Trivial mitral valve regurgitation. MV peak gradient, 6.2 mmHg. The mean mitral valve gradient is 2.0 mmHg. Tricuspid Valve: The tricuspid valve is not well visualized. Tricuspid valve regurgitation is trivial. Aortic Valve: The aortic valve is tricuspid. Aortic valve regurgitation is mild. Aortic valve mean gradient measures 4.0 mmHg. Aortic valve peak gradient measures 8.0 mmHg. Aortic valve area, by VTI measures 2.39 cm. Pulmonic Valve: The pulmonic valve was not well visualized. Pulmonic valve regurgitation is trivial. Aorta: The aortic root is normal in size and structure. IAS/Shunts: No atrial level shunt detected by color flow Doppler.  Agitated saline contrast was given intravenously to evaluate for intracardiac shunting. Agitated saline contrast bubble study was negative, with no evidence of any interatrial shunt.  LEFT VENTRICLE PLAX 2D LVIDd:         5.28 cm  Diastology LVIDs:         3.18 cm  LV e' medial:    3.15 cm/s LV PW:         1.16 cm  LV E/e' medial:  28.8 LV IVS:        1.08 cm  LV e' lateral:   3.48 cm/s LVOT diam:     2.30 cm  LV  E/e' lateral: 26.1 LV SV:         63 LV SV Index:   30 LVOT Area:     4.15 cm  RIGHT VENTRICLE RV Basal diam:  3.30 cm LEFT ATRIUM             Index       RIGHT ATRIUM           Index LA diam:        5.30 cm 2.48 cm/m  RA Area:     11.00 cm LA Vol (A2C):   32.1 ml 15.01 ml/m RA Volume:   23.40 ml  10.95 ml/m LA Vol (A4C):   38.9 ml 18.20 ml/m LA Biplane Vol: 35.1 ml 16.42 ml/m  AORTIC VALVE                   PULMONIC VALVE AV Area (Vmax):    2.41 cm    PV Vmax:       1.13 m/s AV Area (Vmean):   2.37 cm    PV Vmean:      74.800 cm/s AV Area (VTI):     2.39 cm    PV VTI:        0.223 m AV Vmax:           141.00 cm/s PV Peak grad:  5.1 mmHg AV Vmean:          92.100 cm/s PV Mean grad:  3.0 mmHg AV VTI:            0.264 m AV Peak Grad:      8.0 mmHg AV Mean Grad:      4.0 mmHg LVOT Vmax:         81.70 cm/s LVOT Vmean:        52.500 cm/s LVOT VTI:          0.152 m LVOT/AV VTI ratio: 0.58  AORTA Ao Root diam: 3.50 cm MITRAL VALVE MV Area (PHT): 3.15 cm     SHUNTS MV Area VTI:   1.87 cm     Systemic VTI:  0.15 m MV Peak grad:  6.2 mmHg     Systemic Diam: 2.30 cm MV Mean grad:  2.0 mmHg MV Vmax:       1.25 m/s MV Vmean:      60.9 cm/s MV Decel Time: 241 msec MV E velocity: 90.80 cm/s MV A velocity: 119.00 cm/s MV E/A ratio:  0.76 Bartholome Bill MD Electronically signed by Bartholome Bill MD Signature Date/Time: 08/13/2020/12:53:00 PM    Final         Scheduled Meds: . aspirin EC  81 mg Oral Daily  . atorvastatin  40 mg Oral q1800  . carvedilol  6.25 mg Oral BID WC  . cloNIDine  0.2 mg Oral Daily   . cyanocobalamin  1,000 mcg Subcutaneous Daily  . enoxaparin (LOVENOX) injection  40 mg Subcutaneous Q24H  . hydrALAZINE  25 mg Oral BID  . levothyroxine  50 mcg Oral Q0600   Continuous Infusions: . sodium chloride Stopped (08/13/20 1905)  . thiamine injection Stopped (08/13/20 2305)     LOS: 1 day    Time spent: 35 minutes    Sharen Hones, MD [facility]   To contact the attending provider between 7A-7P or the covering provider during after hours 7P-7A, please log into the web site [email or web address] and access using universal [place] password for that web site. If you do not have the password, please call the  hospital operator.  08/14/2020, 10:12 AM

## 2020-08-14 NOTE — Progress Notes (Signed)
Physical Therapy Treatment Patient Details Name: Patricia Ewing MRN: [id number] DOB: 08-25-1950 Today's Date: 08/14/2020    History of Present Illness Pt is a 70 yo female that presented to ED for confusion. PMH of HTNm HLD, afib, glaucoma and tobacco use. MRI reveals "multifocal acute infarctions within the left frontal lobe and insula. Old right cerebellar infarct and findings of chronic microvascular disease as well as multifocal predominantly central chronic microhemorrhages, consistent with chronic hypertensive angiopathy."    PT Comments    Pt was supine in bed upon arriving, She agrees to session with encouragement. Visible frustrated with being in hospital. Pt does have flat affect but answers questions appropriately. Supportive spouse at bedside. Husband pulled Therapist aside and discussed concerns. He endorses being ill himself and concerns about being able to care for her at [place]. Pt required extensively more assistance today versus yesterday. Changing recommendation to rehab at [place] to address safety concerns with mobility. Pt has poor gait quality and is high fall risk.  She would benefit from SNF at [place] to address deficits with strength, transfers, gait deficits and balance. Acute PT will continue to follow per POC.    Follow Up Recommendations  SNF;Supervision for mobility/OOB     Equipment Recommendations  Rolling walker with 5" wheels    Recommendations for Other Services       Precautions / Restrictions Precautions Precautions: Fall    Mobility  Bed Mobility Overal bed mobility: Needs Assistance Bed Mobility: Supine to Sit     Supine to sit: Min assist;Mod assist;HOB elevated Sit to supine: Mod assist   General bed mobility comments: Pt was required alot more assistance today to exit bed.  Transfers Overall transfer level: Needs assistance Equipment used: Rolling walker (2 wheeled) Transfers: Sit to/from Stand Sit to Stand: Min guard;Min assist          General transfer comment: Pt needed 2nd attempt to stand due to slight posterior LOB upon standing. Used BLEs against EOB to assist to EOB  Ambulation/Gait Ambulation/Gait assistance: Min Web designer (Feet): 100 Feet Assistive device: Rolling walker (2 wheeled) Gait Pattern/deviations: Step-to pattern;Drifts right/left (dragging RLE throughout. is able to progress without sliding but needs max vcs) Gait velocity: decrease   General Gait Details: poor gait quality throughout. Does not correct well with cueing       Balance Overall balance assessment: Needs assistance Sitting-balance support: No upper extremity supported;Feet supported Sitting balance-Leahy Scale: Good     Standing balance support: Bilateral upper extremity supported;During functional activity Standing balance-Leahy Scale: Fair       Cognition Arousal/Alertness: Awake/alert Behavior During Therapy: Flat affect Overall Cognitive Status: Impaired/Different from baseline Area of Impairment: Orientation;Attention;Memory;Following commands;Safety/judgement;Problem solving      Orientation Level: Disoriented to;Time Current Attention Level: Selective Memory: Decreased recall of precautions;Decreased short-term memory Following Commands: Follows one step commands inconsistently;Follows one step commands with increased time Safety/Judgement: Decreased awareness of safety;Decreased awareness of deficits   Problem Solving: Slow processing;Decreased initiation;Requires verbal cues;Difficulty sequencing;Requires tactile cues General Comments: Pt A and O x 4             Pertinent Vitals/Pain Pain Assessment: No/denies pain           PT Goals (current goals can now be found in the care plan section) Acute Rehab PT Goals Patient Stated Goal: to go home Progress towards PT goals: Progressing toward goals    Frequency    7X/week      PT Plan Discharge plan needs  to be updated       AM-PAC  PT "6 Clicks" Mobility   Outcome Measure  Help needed turning from your back to your side while in a flat bed without using bedrails?: A Little Help needed moving from lying on your back to sitting on the side of a flat bed without using bedrails?: A Little Help needed moving to and from a bed to a chair (including a wheelchair)?: A Lot Help needed standing up from a chair using your arms (e.g., wheelchair or bedside chair)?: A Lot Help needed to walk in hospital room?: A Little Help needed climbing 3-5 steps with a railing? : A Lot 6 Click Score: 15    End of Session Equipment Utilized During Treatment: Gait belt Activity Tolerance: Patient tolerated treatment well Patient left: in chair;with chair alarm set;with call bell/phone within reach Nurse Communication: Mobility status PT Visit Diagnosis: Other abnormalities of gait and mobility (R26.89);Muscle weakness (generalized) (M62.81);Difficulty in walking, not elsewhere classified (R26.2)     Time: 6644-0347 PT Time Calculation (min) (ACUTE ONLY): 28 min  Charges:  $Gait Training: 8-22 mins $Therapeutic Activity: 8-22 mins                     Julaine Fusi PTA 08/14/20, 11:10 AM

## 2020-08-15 LAB — BASIC METABOLIC PANEL
Anion gap: 9 (ref 5–15)
BUN: 17 mg/dL (ref 8–23)
CO2: 24 mmol/L (ref 22–32)
Calcium: 8.9 mg/dL (ref 8.9–10.3)
Chloride: 106 mmol/L (ref 98–111)
Creatinine, Ser: 1.42 mg/dL — ABNORMAL HIGH (ref 0.44–1.00)
GFR, Estimated: 40 mL/min — ABNORMAL LOW (ref >60)
Glucose, Bld: 103 mg/dL — ABNORMAL HIGH (ref 70–99)
Potassium: 3.7 mmol/L (ref 3.5–5.1)
Sodium: 139 mmol/L (ref 135–145)

## 2020-08-15 MED ORDER — THIAMINE HCL 100 MG PO TABS
100.0000 mg | ORAL_TABLET | Freq: Every day | ORAL | Status: DC
  Administered 2020-08-16 – 2020-08-18 (×3): 100 mg via ORAL
  Filled 2020-08-15 (×3): qty 1

## 2020-08-15 MED ORDER — AMLODIPINE BESYLATE 10 MG PO TABS
10.0000 mg | ORAL_TABLET | Freq: Every day | ORAL | Status: DC
  Administered 2020-08-15 – 2020-08-18 (×4): 10 mg via ORAL
  Filled 2020-08-15 (×4): qty 1

## 2020-08-15 NOTE — TOC Initial Note (Signed)
Transition of Care [facility]) - Initial/Assessment Note    Patient Details  Name: Patricia Ewing MRN: [id number] Date of Birth: 02-07-1951  [facility] CM/SW Contact:    Boris Sharper, LCSW Phone Number: 08/15/2020, 3:44 PM  Clinical Narrative:                 CSW called to speak with pt's spouse due to pt's ongoing confusion. CSW notified pt's spouse of PT and OT recommendations, he stated that he would like to think about it and try to talk it over with the pt before making a decision on [facility] or SNF.   TOC please follow up         Patient Goals and CMS Choice        Expected Discharge Plan and Services                                                Prior Living Arrangements/Services                       Activities of Daily Living      Permission Sought/Granted                  Emotional Assessment              Admission diagnosis:  Slurred speech [R47.81] Altered mental status [R41.82] Stroke (cerebrum) ([place]) [I63.9] Elevated troponin [R77.8] AKI (acute kidney injury) ([place]) [N17.9] Altered mental status, unspecified altered mental status type [R41.82] Acute arterial ischemic stroke, multifocal, anterior circulation, left [facility]) [I63.522] Patient Active Problem List   Diagnosis Date Noted  . Chronic kidney disease, stage 3b ([place]) 08/14/2020  . Acute arterial ischemic stroke, multifocal, anterior circulation, left ([place]) 08/13/2020  . Altered mental status 08/12/2020  . Obesity (BMI 30-39.9) 08/12/2020  . AKI (acute kidney injury) ([place]) 08/12/2020  . Sepsis ([place]) 11/29/2015  . Acute chest pain 11/30/2011  . HTN (hypertension) 11/30/2011  . Bilateral leg edema 11/30/2011   PCP:  Baxter Hire, MD Pharmacy:   [id number] [address], [place] [address] Phone: [phone] Fax: [phone]     Social Determinants of Health (SDOH) Interventions     Readmission Risk Interventions No flowsheet data found.

## 2020-08-15 NOTE — Progress Notes (Signed)
Physical Therapy Treatment Patient Details Name: Patricia Ewing MRN: [id number] DOB: 07/06/1951 Today's Date: 08/15/2020    History of Present Illness Pt is a 70 yo female that presented to ED for confusion. PMH of HTNm HLD, afib, glaucoma and tobacco use. MRI reveals "multifocal acute infarctions within the left frontal lobe and insula. Old right cerebellar infarct and findings of chronic microvascular disease as well as multifocal predominantly central chronic microhemorrhages, consistent with chronic hypertensive angiopathy."    PT Comments    OOB with min a x 1.  She is able to stand and walk 88' with RW and min assist for verbal cues and assist to navigate walker at times.  Very slow speed with very small step length and height.  No LOB but fatigue noted with decreased gait quality and speed as she fatigues.  Breakfast arrived during gait and she remained up in chair to eat.  Needs met.    Follow Up Recommendations  SNF;Supervision for mobility/OOB     Equipment Recommendations  Rolling walker with 5" wheels    Recommendations for Other Services       Precautions / Restrictions Precautions Precautions: Fall    Mobility  Bed Mobility Overal bed mobility: Needs Assistance Bed Mobility: Supine to Sit     Supine to sit: Min assist;HOB elevated        Transfers Overall transfer level: Needs assistance Equipment used: Rolling walker (2 wheeled) Transfers: Sit to/from Stand Sit to Stand: Min assist            Ambulation/Gait Ambulation/Gait assistance: Min Web designer (Feet): 60 Feet Assistive device: Rolling walker (2 wheeled) Gait Pattern/deviations: Step-to pattern;Drifts right/left Gait velocity: decrease   General Gait Details: very short shuffling steps with decreased step height and length bilaterally.   Stairs             Wheelchair Mobility    Modified Rankin (Stroke Patients Only)       Balance Overall balance assessment:  Needs assistance Sitting-balance support: No upper extremity supported;Feet supported Sitting balance-Leahy Scale: Good     Standing balance support: Bilateral upper extremity supported;During functional activity Standing balance-Leahy Scale: Fair                              Cognition Arousal/Alertness: Awake/alert Behavior During Therapy: Flat affect Overall Cognitive Status: No family/caregiver present to determine baseline cognitive functioning                                        Exercises      General Comments        Pertinent Vitals/Pain Pain Assessment: No/denies pain    Home Living                      Prior Function            PT Goals (current goals can now be found in the care plan section) Progress towards PT goals: Progressing toward goals    Frequency    7X/week      PT Plan Discharge plan needs to be updated    Co-evaluation              AM-PAC PT "6 Clicks" Mobility   Outcome Measure  Help needed turning from your back to your side while in a flat  bed without using bedrails?: A Little Help needed moving from lying on your back to sitting on the side of a flat bed without using bedrails?: A Little Help needed moving to and from a bed to a chair (including a wheelchair)?: A Little Help needed standing up from a chair using your arms (e.g., wheelchair or bedside chair)?: A Lot Help needed to walk in hospital room?: A Little Help needed climbing 3-5 steps with a railing? : A Lot 6 Click Score: 16    End of Session Equipment Utilized During Treatment: Gait belt Activity Tolerance: Patient tolerated treatment well Patient left: in chair;with chair alarm set;with call bell/phone within reach Nurse Communication: Mobility status PT Visit Diagnosis: Other abnormalities of gait and mobility (R26.89);Muscle weakness (generalized) (M62.81);Difficulty in walking, not elsewhere classified (R26.2)      Time: 5659-9437 PT Time Calculation (min) (ACUTE ONLY): 13 min  Charges:  $Gait Training: 8-22 mins                    Chesley Noon, PTA 08/15/20, 8:20 AM

## 2020-08-15 NOTE — Progress Notes (Signed)
PROGRESS NOTE    Patricia Ewing  [id number] DOB: [birth year] DOA: 08/12/2020 PCP: Baxter Hire, MD   Chief complaint.  Altered mental status Brief Narrative:  Patricia Demus Jonesis a 70 y.o.femalewith medical history significant forcataracts status post cataract extraction and left,coronary artery disease,pretension, atherosclerosis of abdominal aorta, history of NSTEMI, hyperlipidemia, atrial fibrillation, presented to the emergency department for chief concerns of confusion. MRI of the brain showed multifocal acute ischemia within the left frontal lobe and insula. She also has significant angiopathy.She has been seen by neurology,she also has overlapping acute thiamine deficiency. Started on high-dose of thiamine for 3 days. She has B12 deficiency, started subcu injection for 7 days.   Assessment & Plan:   Principal Problem:   Altered mental status Active Problems:   HTN (hypertension)   Obesity (BMI 30-39.9)   Acute arterial ischemic stroke, multifocal, anterior circulation, left (HCC)   Chronic kidney disease, stage 3b ([place])  #1.Acute ischemic stroke in the left frontal lobe. Chronic hypertensive angiopathy. Hypertension encephalopathy. Essential hypertension. Continue Lipitor and aspirin.  Increase dose of Norvasc to 10 mg daily.  2.  Thiamine deficiency Vitamin B12 deficiency. Continue IV supplements.  #3.  Hypothyroidism. Continue Synthroid lipid  4.  Elevated troponin Secondary to demand ischemia from stroke.  5.  Chronic kidney disease stage IIIb. Continue to follow.     DVT prophylaxis: Lovenox Code Status: Full Family Communication:  Disposition Plan:  .   Status is: Inpatient  Remains inpatient appropriate because:Inpatient level of care appropriate due to severity of illness   Dispo: The patient is from: Home              Anticipated d/c is to: SNF              Anticipated d/c date is: 2 days              Patient currently is not  medically stable to d/c.        I/O last 3 completed shifts: In: 675.7 [P.O.:600; I.V.:9.2; IV Piggyback:66.5] Out: 1100 [Urine:1100] No intake/output data recorded.     Consultants:   Neurology  Procedures: None  Antimicrobials: None  Subjective: Patient doing well today.  She is sitting the chair eating well.  She does not have any short of breath or cough.  No nausea vomiting abdominal pain.  She had a bowel movement. She did not have dysuria hematuria. No fever or chills.  Objective: Vitals:   08/14/20 2128 08/14/20 2349 08/15/20 0454 08/15/20 0842  BP: (!) 166/100 (!) 166/94 (!) 177/81 (!) 160/98  Pulse: 67 63 64 86  Resp: 18 18 16 18   Temp: 98.5 F (36.9 C) 98.1 F (36.7 C) 98.4 F (36.9 C) 99.2 F (37.3 C)  TempSrc:      SpO2: 97% 100% 100% 96%  Weight:      Height:        Intake/Output Summary (Last 24 hours) at 08/15/2020 0936 Last data filed at 08/14/2020 1916 Gross per 24 hour  Intake 600 ml  Output 200 ml  Net 400 ml   Filed Weights   08/12/20 1113 08/13/20 0137  Weight: 106.6 kg 103.4 kg    Examination:  General exam: Appears calm and comfortable  Respiratory system: Clear to auscultation. Respiratory effort normal. Cardiovascular system: S1 & S2 heard, RRR. No JVD, murmurs, rubs, gallops or clicks. No pedal edema. Gastrointestinal system: Abdomen is nondistended, soft and nontender. No organomegaly or masses felt. Normal bowel sounds heard. Central  nervous system: Alert and oriented x3. No focal neurological deficits. Extremities: Symmetric 5 x 5 power. Skin: No rashes, lesions or ulcers Psychiatry: . Mood & affect appropriate.     Data Reviewed: I have personally reviewed following labs and imaging studies  CBC: Recent Labs  Lab 08/12/20 1117 08/13/20 0009 08/14/20 0440  WBC 6.4 7.1 6.1  NEUTROABS  --   --  3.9  HGB 12.8 12.2 12.4  HCT 38.4 36.2 36.8  MCV 94.8 94.3 94.6  PLT 214 188 500   Basic Metabolic Panel: Recent  Labs  Lab 08/12/20 1117 08/13/20 0009 08/14/20 0440 08/15/20 0644  NA 140 139 141 139  K 3.4* 3.3* 3.9 3.7  CL 103 105 106 106  CO2 24 26 27 24   GLUCOSE 135* 105* 107* 103*  BUN 20 18 15 17   CREATININE 1.42* 1.26* 1.37* 1.42*  CALCIUM 9.3 8.7* 9.1 8.9  MG 2.3  --  2.2  --    GFR: Estimated Creatinine Clearance: 45.6 mL/min (A) (by C-G formula based on SCr of 1.42 mg/dL (H)). Liver Function Tests: Recent Labs  Lab 08/12/20 1117  AST 17  ALT 13  ALKPHOS 73  BILITOT 0.7  PROT 8.3*  ALBUMIN 4.0   No results for input(s): LIPASE, AMYLASE in the last 168 hours. Recent Labs  Lab 08/13/20 0009  AMMONIA 16   Coagulation Profile: No results for input(s): INR, PROTIME in the last 168 hours. Cardiac Enzymes: No results for input(s): CKTOTAL, CKMB, CKMBINDEX, TROPONINI in the last 168 hours. BNP (last 3 results) No results for input(s): PROBNP in the last 8760 hours. HbA1C: No results for input(s): HGBA1C in the last 72 hours. CBG: No results for input(s): GLUCAP in the last 168 hours. Lipid Profile: No results for input(s): CHOL, HDL, LDLCALC, TRIG, CHOLHDL, LDLDIRECT in the last 72 hours. Thyroid Function Tests: Recent Labs    08/13/20 0009  TSH 6.033*   Anemia Panel: Recent Labs    08/12/20 1557  VITAMINB12 286  FOLATE 13.1  FERRITIN 78  TIBC 333  IRON 63  RETICCTPCT 1.3   Sepsis Labs: No results for input(s): PROCALCITON, LATICACIDVEN in the last 168 hours.  Recent Results (from the past 240 hour(s))  SARS CORONAVIRUS 2 (TAT 6-24 HRS) Nasopharyngeal Nasopharyngeal Swab     Status: None   Collection Time: 08/12/20  3:57 PM   Specimen: Nasopharyngeal Swab  Result Value Ref Range Status   SARS Coronavirus 2 NEGATIVE NEGATIVE Final    Comment: (NOTE) SARS-CoV-2 target nucleic acids are NOT DETECTED.  The SARS-CoV-2 RNA is generally detectable in upper and lower respiratory specimens during the acute phase of infection. Negative results do not preclude  SARS-CoV-2 infection, do not rule out co-infections with other pathogens, and should not be used as the sole basis for treatment or other patient management decisions. Negative results must be combined with clinical observations, patient history, and epidemiological information. The expected result is Negative.  Fact Sheet for Patients: SugarRoll.be  Fact Sheet for Healthcare Providers: https://www.woods-mathews.com/  This test is not yet approved or cleared by the [place] FDA and  has been authorized for detection and/or diagnosis of SARS-CoV-2 by FDA under an Emergency Use Authorization (EUA). This EUA will remain  in effect (meaning this test can be used) for the duration of the COVID-19 declaration under Se ction 564(b)(1) of the Act, 21 U.S.C. section 360bbb-3(b)(1), unless the authorization is terminated or revoked sooner.  Performed at [facility], [place]  [address] [place], [place] [address]          Radiology Studies: EEG  Result Date: 08/13/2020 Lora Havens, MD     08/13/2020  4:03 PM Patient Name: Patricia Ewing MRN: [id number] Epilepsy Attending: Lora Havens Referring Physician/Provider: Dr Rupert Stacks Date: 08/13/2020 Duration: 22.36 mins Patient history: 70 year old female with a 3-4 week history ofcognitive decline. EEG to evaluate for seizure Level of alertness: Awake AEDs during EEG study: None Technical aspects: This EEG study was done with scalp electrodes positioned according to the 10-20 International system of electrode placement. Electrical activity was acquired at a sampling rate of 500Hz  and reviewed with a high frequency filter of 70Hz  and a low frequency filter of 1Hz . EEG data were recorded continuously and digitally stored. Description: The posterior dominant rhythm consists of 8 Hz activity of moderate voltage (25-35 uV) seen predominantly in posterior head regions, symmetric and reactive to eye  opening and eye closing. EEG showed intermittent left frontotemporal 3 to 6 Hz theta-delta slowing. Physiologic photic driving was not seen during photic stimulation. Hyperventilation was not performed.   ABNORMALITY -Intermittent slow, left frontotemporal  region IMPRESSION: This study is suggestive cortical dysfunction in left frontotemporal  Region likely secondary to underlying stroke. No seizures or epileptiform discharges were seen throughout the recording. Lora Havens   US Carotid Bilateral  Result Date: 08/13/2020 CLINICAL DATA:  Stroke, hypertension, hyperlipidemia, history of tobacco use EXAM: BILATERAL CAROTID DUPLEX ULTRASOUND TECHNIQUE: Pearline Cables scale imaging, color Doppler and duplex ultrasound were performed of bilateral carotid and vertebral arteries in the neck. COMPARISON:  None. FINDINGS: Criteria: Quantification of carotid stenosis is based on velocity parameters that correlate the residual internal carotid diameter with NASCET-based stenosis levels, using the diameter of the distal internal carotid lumen as the denominator for stenosis measurement. The following velocity measurements were obtained: RIGHT ICA: 68/19 cm/sec CCA: 0000000 cm/sec SYSTOLIC ICA/CCA RATIO:  0.9 ECA: 92 cm/sec LEFT ICA: 76/22 cm/sec CCA: [id number] cm/sec SYSTOLIC ICA/CCA RATIO:  0.8 ECA: 111 cm/sec RIGHT CAROTID ARTERY: Eccentric calcified plaque in the distal CCA, bulb, and ICA origin with only mild stenosis. Normal waveforms and color Doppler signal throughout. RIGHT VERTEBRAL ARTERY:  Normal flow direction and waveform. LEFT CAROTID ARTERY: Eccentric partially calcified plaque in the distal common carotid artery, bulb, and proximal ICA with mild stenosis. Normal waveforms and color Doppler signal throughout. Mild tortuosity ICA. LEFT VERTEBRAL ARTERY:  Normal flow direction and waveform. IMPRESSION: 1. Bilateral carotid bifurcation plaque resulting in less than 50% diameter ICA stenosis. 2. Antegrade bilateral vertebral  arterial flow. Electronically Signed   By: Lucrezia Europe M.D.   On: 08/13/2020 12:43   ECHOCARDIOGRAM COMPLETE BUBBLE STUDY  Result Date: 08/13/2020    ECHOCARDIOGRAM REPORT   Patient Name:   SAKAI STRANDE Date of Exam: 08/13/2020 Medical Rec #:  [id number]       Height:       67.0 in Accession #:    [id number]      Weight:       228.0 lb Date of Birth:  23-Jan-1951      BSA:          2.138 m Patient Age:    60 years        BP:           113/87 mmHg Patient Gender: F               HR:  50 bpm. Exam Location:  [facility] Procedure: 2D Echo, Color Doppler, Cardiac Doppler and Saline Contrast Bubble            Study Indications:     I63.9 Stroke  History:         Patient has prior history of Echocardiogram examinations. CAD;                  Risk Factors:Current Smoker, Hypertension and Dyslipidemia.  Sonographer:     Charmayne Sheer RDCS (AE) Referring Phys:  [place] Diagnosing Phys: Bartholome Bill MD  Sonographer Comments: Image acquisition challenging due to patient body habitus. IMPRESSIONS  1. Left ventricular ejection fraction, by estimation, is 70 to 75%. The left ventricle has hyperdynamic function. The left ventricle has no regional wall motion abnormalities. Left ventricular diastolic parameters were normal.  2. Right ventricular systolic function is normal. The right ventricular size is normal.  3. Left atrial size was mildly dilated.  4. The mitral valve is grossly normal. Trivial mitral valve regurgitation.  5. The aortic valve is tricuspid. Aortic valve regurgitation is mild.  6. Agitated saline contrast bubble study was negative, with no evidence of any interatrial shunt. FINDINGS  Left Ventricle: Left ventricular ejection fraction, by estimation, is 70 to 75%. The left ventricle has hyperdynamic function. The left ventricle has no regional wall motion abnormalities. The left ventricular internal cavity size was normal in size. There is no left ventricular hypertrophy. Left ventricular diastolic  parameters were normal. Right Ventricle: The right ventricular size is normal. No increase in right ventricular wall thickness. Right ventricular systolic function is normal. Left Atrium: Left atrial size was mildly dilated. Right Atrium: Right atrial size was normal in size. Pericardium: There is no evidence of pericardial effusion. Mitral Valve: The mitral valve is grossly normal. Trivial mitral valve regurgitation. MV peak gradient, 6.2 mmHg. The mean mitral valve gradient is 2.0 mmHg. Tricuspid Valve: The tricuspid valve is not well visualized. Tricuspid valve regurgitation is trivial. Aortic Valve: The aortic valve is tricuspid. Aortic valve regurgitation is mild. Aortic valve mean gradient measures 4.0 mmHg. Aortic valve peak gradient measures 8.0 mmHg. Aortic valve area, by VTI measures 2.39 cm. Pulmonic Valve: The pulmonic valve was not well visualized. Pulmonic valve regurgitation is trivial. Aorta: The aortic root is normal in size and structure. IAS/Shunts: No atrial level shunt detected by color flow Doppler. Agitated saline contrast was given intravenously to evaluate for intracardiac shunting. Agitated saline contrast bubble study was negative, with no evidence of any interatrial shunt.  LEFT VENTRICLE PLAX 2D LVIDd:         5.28 cm  Diastology LVIDs:         3.18 cm  LV e' medial:    3.15 cm/s LV PW:         1.16 cm  LV E/e' medial:  28.8 LV IVS:        1.08 cm  LV e' lateral:   3.48 cm/s LVOT diam:     2.30 cm  LV E/e' lateral: 26.1 LV SV:         63 LV SV Index:   30 LVOT Area:     4.15 cm  RIGHT VENTRICLE RV Basal diam:  3.30 cm LEFT ATRIUM             Index       RIGHT ATRIUM           Index LA diam:  5.30 cm 2.48 cm/m  RA Area:     11.00 cm LA Vol (A2C):   32.1 ml 15.01 ml/m RA Volume:   23.40 ml  10.95 ml/m LA Vol (A4C):   38.9 ml 18.20 ml/m LA Biplane Vol: 35.1 ml 16.42 ml/m  AORTIC VALVE                   PULMONIC VALVE AV Area (Vmax):    2.41 cm    PV Vmax:       1.13 m/s AV  Area (Vmean):   2.37 cm    PV Vmean:      74.800 cm/s AV Area (VTI):     2.39 cm    PV VTI:        0.223 m AV Vmax:           141.00 cm/s PV Peak grad:  5.1 mmHg AV Vmean:          92.100 cm/s PV Mean grad:  3.0 mmHg AV VTI:            0.264 m AV Peak Grad:      8.0 mmHg AV Mean Grad:      4.0 mmHg LVOT Vmax:         81.70 cm/s LVOT Vmean:        52.500 cm/s LVOT VTI:          0.152 m LVOT/AV VTI ratio: 0.58  AORTA Ao Root diam: 3.50 cm MITRAL VALVE MV Area (PHT): 3.15 cm     SHUNTS MV Area VTI:   1.87 cm     Systemic VTI:  0.15 m MV Peak grad:  6.2 mmHg     Systemic Diam: 2.30 cm MV Mean grad:  2.0 mmHg MV Vmax:       1.25 m/s MV Vmean:      60.9 cm/s MV Decel Time: 241 msec MV E velocity: 90.80 cm/s MV A velocity: 119.00 cm/s MV E/A ratio:  0.76 Bartholome Bill MD Electronically signed by Bartholome Bill MD Signature Date/Time: 08/13/2020/12:53:00 PM    Final         Scheduled Meds: . amLODipine  5 mg Oral Daily  . aspirin EC  81 mg Oral Daily  . atorvastatin  40 mg Oral q1800  . carvedilol  6.25 mg Oral BID WC  . cloNIDine  0.2 mg Oral Daily  . cyanocobalamin  1,000 mcg Subcutaneous Daily  . dorzolamide-timolol  1 drop Both Eyes BID  . enoxaparin (LOVENOX) injection  40 mg Subcutaneous Q24H  . hydrALAZINE  25 mg Oral BID  . levothyroxine  50 mcg Oral Q0600  . Netarsudil-Latanoprost  1 drop Both Eyes BID  . [START ON 08/16/2020] thiamine  100 mg Oral Daily  . timolol  1 drop Both Eyes BID   Continuous Infusions: . sodium chloride Stopped (08/13/20 1905)  . thiamine injection 500 mg (08/14/20 2115)     LOS: 2 days    Time spent: 27 minutes    Sharen Hones, MD [facility]   To contact the attending provider between 7A-7P or the covering provider during after hours 7P-7A, please log into the web site [email or web address] and access using universal [place] password for that web site. If you do not have the password, please call the hospital operator.  08/15/2020, 9:36 AM

## 2020-08-16 ENCOUNTER — Inpatient Hospital Stay: Payer: Medicare Other

## 2020-08-16 DIAGNOSIS — R4701 Aphasia: Secondary | ICD-10-CM

## 2020-08-16 DIAGNOSIS — I639 Cerebral infarction, unspecified: Secondary | ICD-10-CM

## 2020-08-16 IMAGING — MR MR HEAD W/O CM
11 of 12 series · 39 of 48 positions shown · non-contrast
Comparison: [DATE] and prior.

CLINICAL DATA: Neuro deficit, acute, stroke suspected

EXAM:
MRI HEAD WITHOUT CONTRAST
TECHNIQUE: Multiplanar, multiecho pulse sequences of the brain and surrounding
structures were obtained without intravenous contrast.

[Series 5: ax dwi_tracew · axial · 3.0mm · 0.60mm/px · z∈[-98,+55]mm · 5 of 48 slices shown]
[im 1/48]
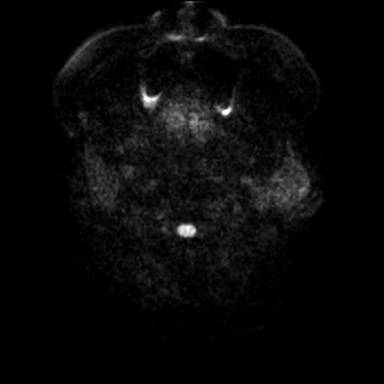
[im 12/48]
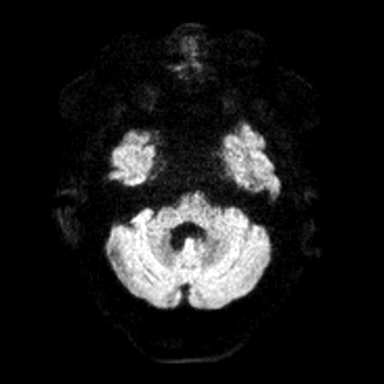
[im 24/48]
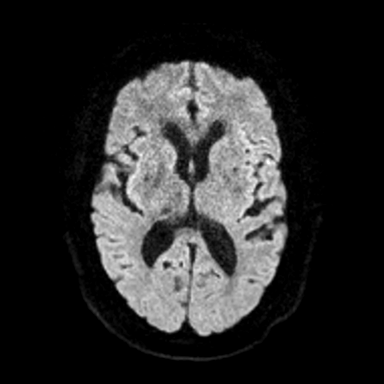
[im 36/48]
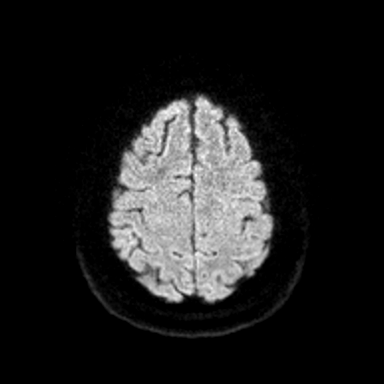
[im 48/48]
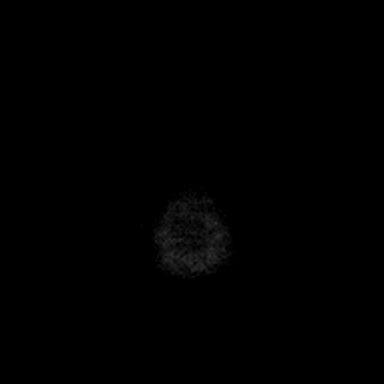

[Series 6: ax dwi_adc · axial · 3.0mm · 0.60mm/px · z∈[-98,+55]mm · 5 of 48 slices shown]
[im 1/48]
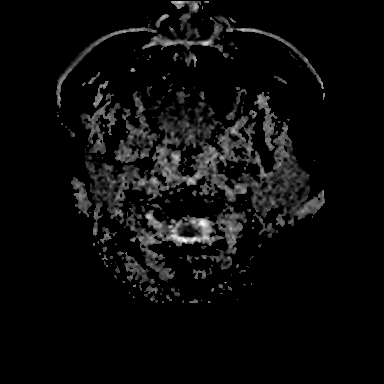
[im 12/48]
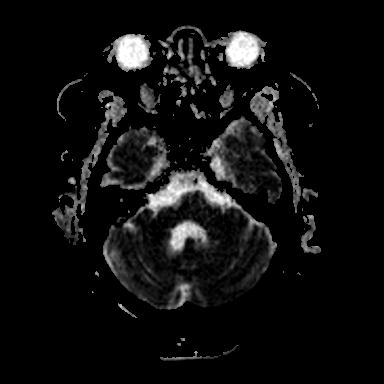
[im 24/48]
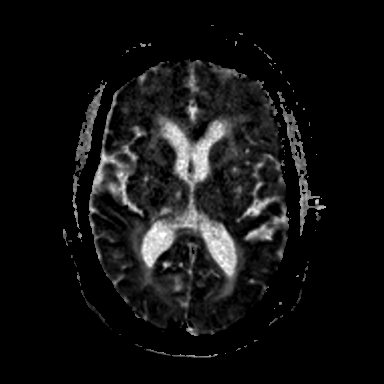
[im 36/48]
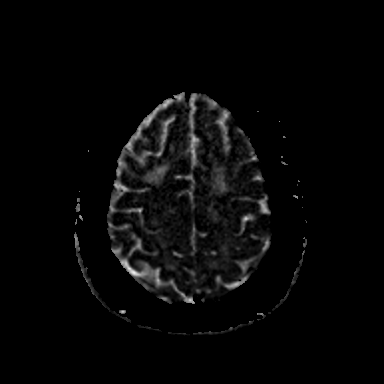
[im 48/48]
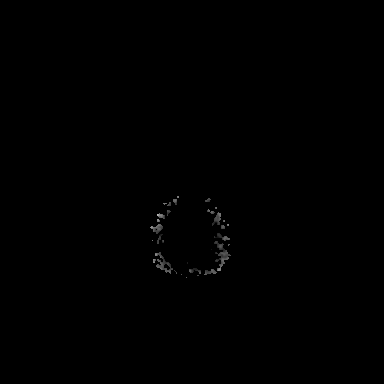

[Series 7: cor dwi_tracew · coronal · 5.0mm · 0.60mm/px · 3 of 40 slices shown]
[im 1/40]
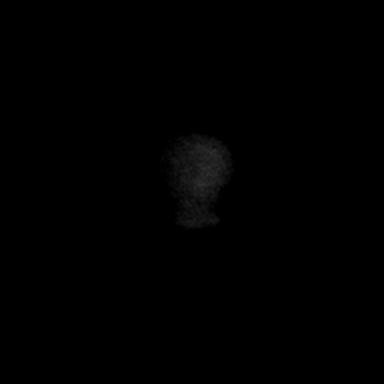
[im 20/40]
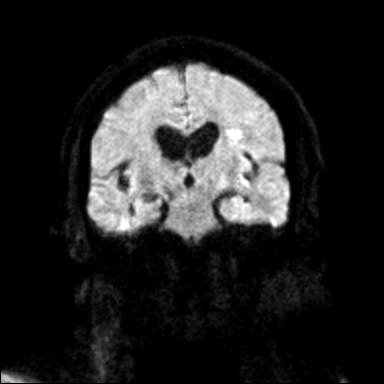
[im 40/40]
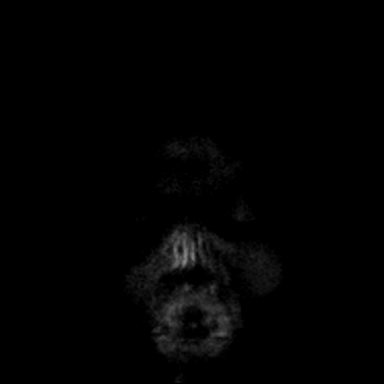

[Series 8: cor dwi_adc · coronal · 5.0mm · 0.60mm/px · 3 of 39 slices shown]
[im 1/39]
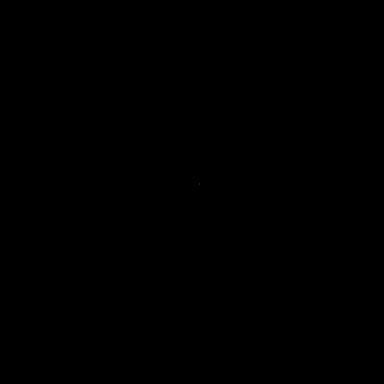
[im 20/39]
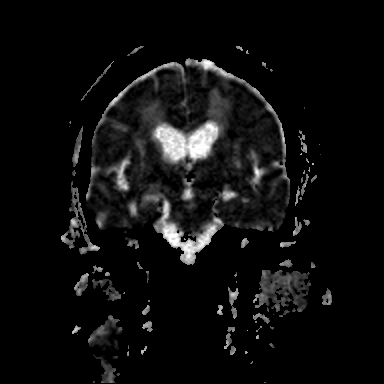
[im 39/39]
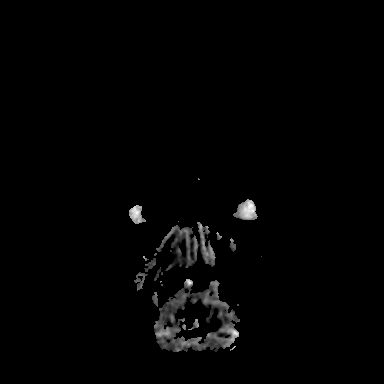

[Series 9: T1 · sagittal · 5.0mm · 0.62mm/px · 2 of 25 slices shown (1 of 3)]
[im 1/25]
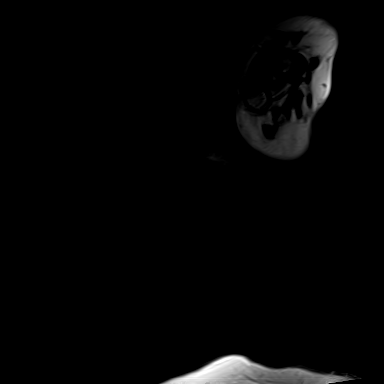
[im 25/25]
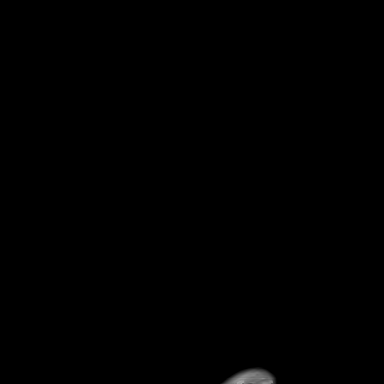

[Series 10: T2 · axial · 5.0mm · 0.53mm/px · z∈[-93,+49]mm · 2 of 25 slices shown (1 of 2)]
[im 1/25]
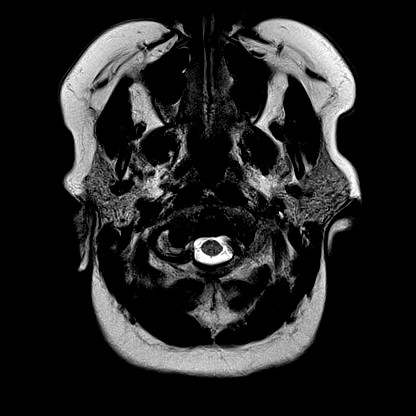
[im 25/25]
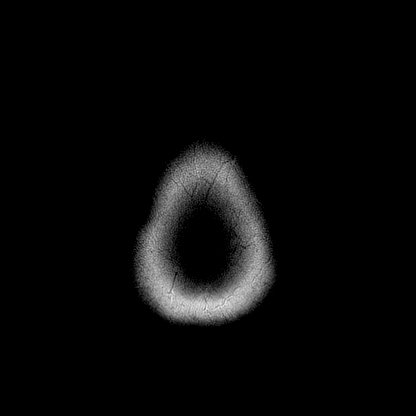

[Series 11: FLAIR · axial · 3.0mm · 0.53mm/px · z∈[-102,+58]mm · 5 of 55 slices shown (1 of 2)]
[im 1/55]
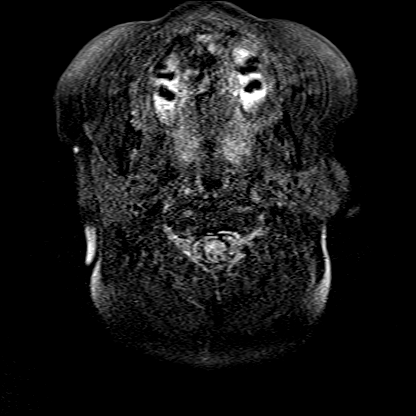
[im 14/55]
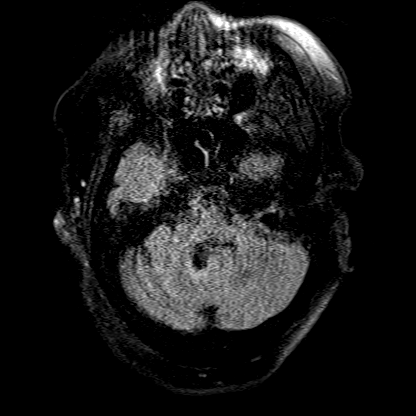
[im 28/55]
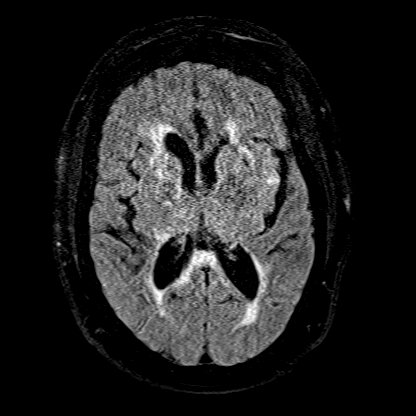
[im 41/55]
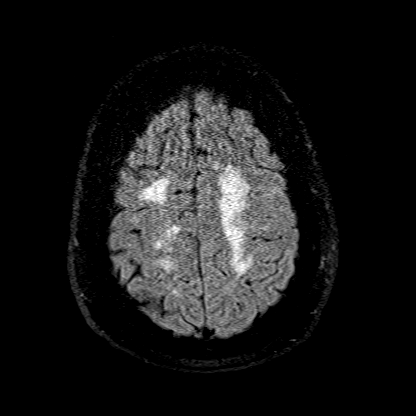
[im 55/55]
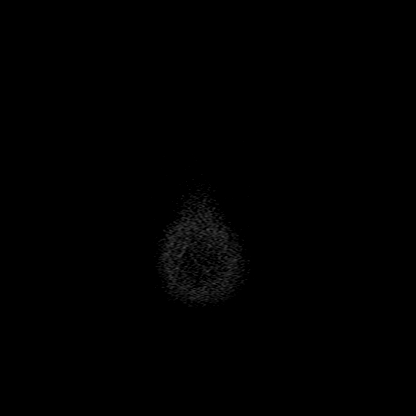

[Series 12: T1 · axial · 1.0mm · 0.98mm/px · z∈[-105,+67]mm · 8 of 176 slices shown (2 of 3)]
[im 1/176]
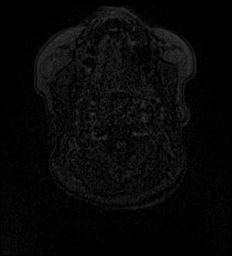
[im 26/176]
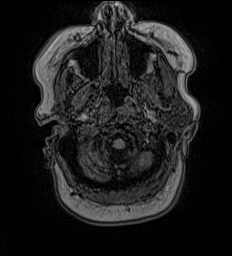
[im 51/176]
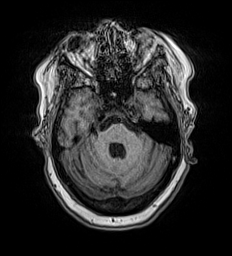
[im 76/176]
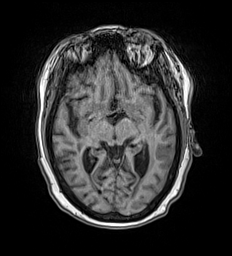
[im 101/176]
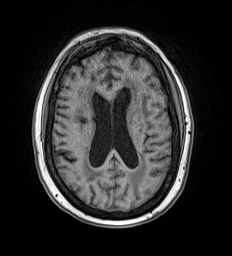
[im 126/176]
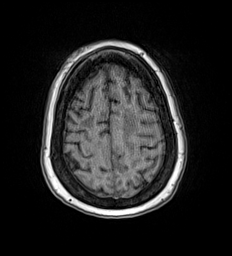
[im 151/176]
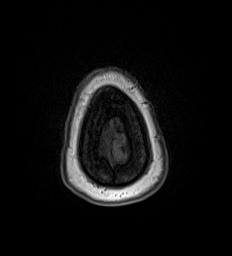
[im 176/176]
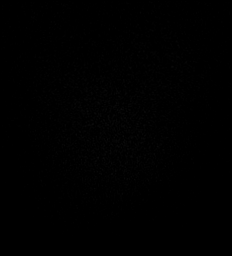

[Series 13: T2 · coronal · 5.0mm · 0.57mm/px · 2 of 29 slices shown (2 of 2)]
[im 1/29]
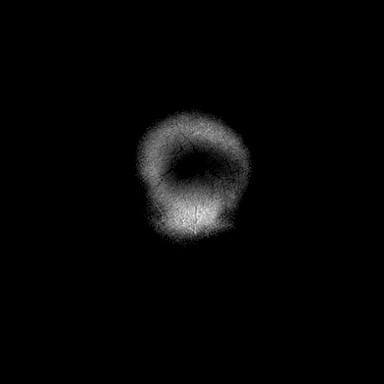
[im 29/29]
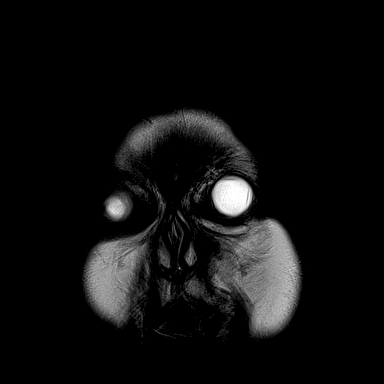

[Series 14: T1 · sagittal · 5.0mm · 0.94mm/px · 2 of 23 slices shown (3 of 3)]
[im 1/23]
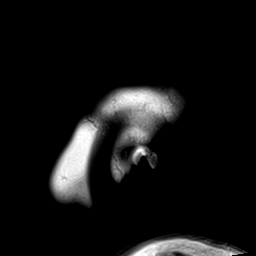
[im 23/23]
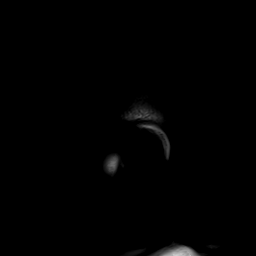

[Series 15: FLAIR · axial · 5.0mm · 1.20mm/px · z∈[-98,+55]mm · 2 of 27 slices shown (2 of 2)]
[im 1/27]
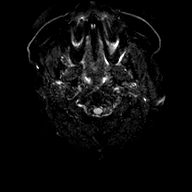
[im 27/27]
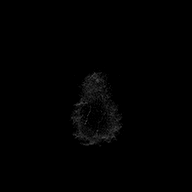

[39 of 48 positions shown; findings below may reference images not displayed]

FINDINGS: Brain: Multifocal subacute left frontal and insular infarcts are
unchanged. No new focus of restricted diffusion to suggest acute
infarct. No acute intracranial hemorrhage. Centralized foci of SWI
signal dropout likely reflect sequela of chronic hypertensive
angiopathy. Mild cerebral atrophy with ex vacuo dilatation. Advanced
chronic microvascular ischemic changes.

Sequela of remote insult involving the right frontal white matter,
bilateral basal ganglia, thalamus, pons and right cerebellum. No
midline shift, ventriculomegaly or extra-axial fluid collection. No
mass lesion.

Vascular: Please see recent MRA.

Skull and upper cervical spine: Normal marrow signal.

Sinuses/Orbits: Sequela of bilateral lens replacement. Clear
paranasal sinuses and mastoid air cells.

Other: None.
IMPRESSION: Multifocal subacute left frontal/insular infarcts are unchanged.

No new site of infarct.  No acute intracranial hemorrhage.

Sequela of chronic hypertensive angiopathy. Advanced chronic
microvascular ischemic changes.

## 2020-08-16 IMAGING — CT CT HEAD CODE STROKE
3 series · 15 of 47 positions shown, 18 images · non-contrast
Comparison: [DATE] and prior.

CLINICAL DATA: Code stroke.  Neuro deficit, acute, stroke suspected

EXAM:
CT HEAD WITHOUT CONTRAST
TECHNIQUE: Contiguous axial images were obtained from the base of the skull
through the vertex without intravenous contrast.

[Series 3: head wo · axial · 0.47mm/px · z∈[-178,-43]mm · 9 of 33 slices shown, 12 images]
[im 3/33  brain]
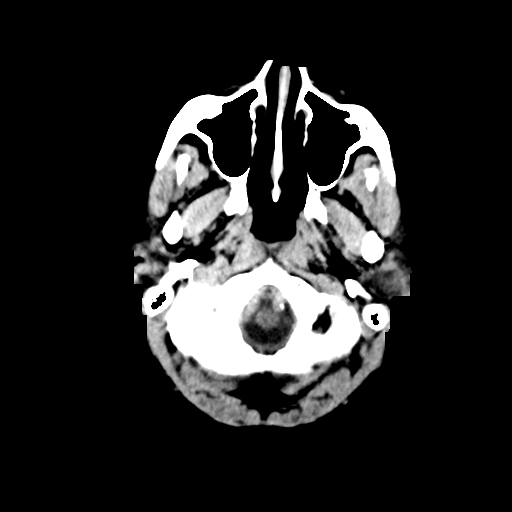
[im 3/33  bone]
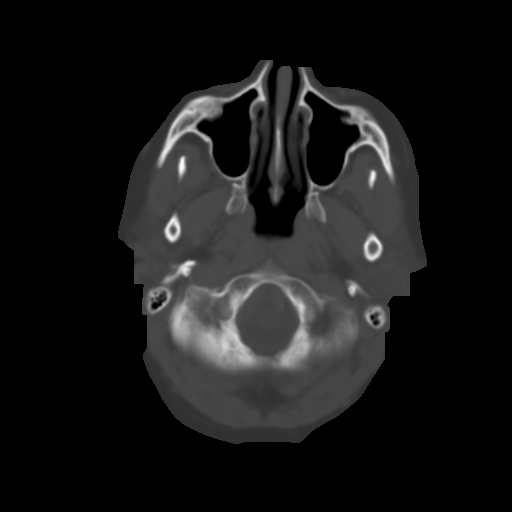
[im 6/33  brain]
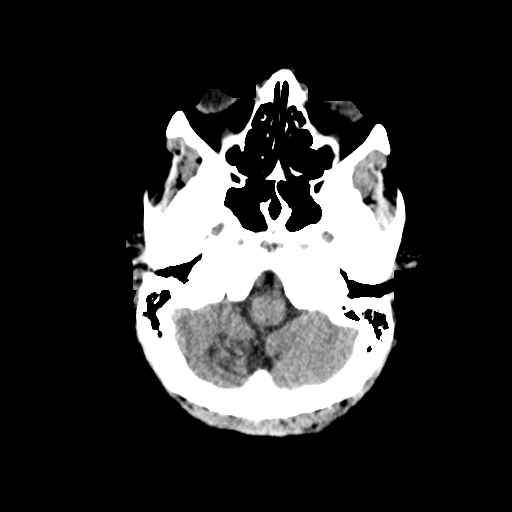
[im 9/33  brain]
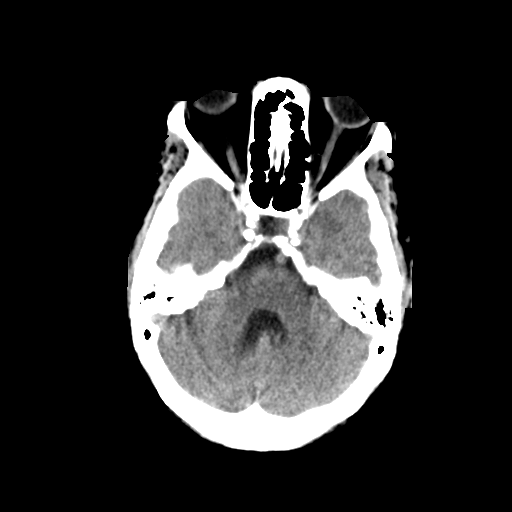
[im 13/33  brain]
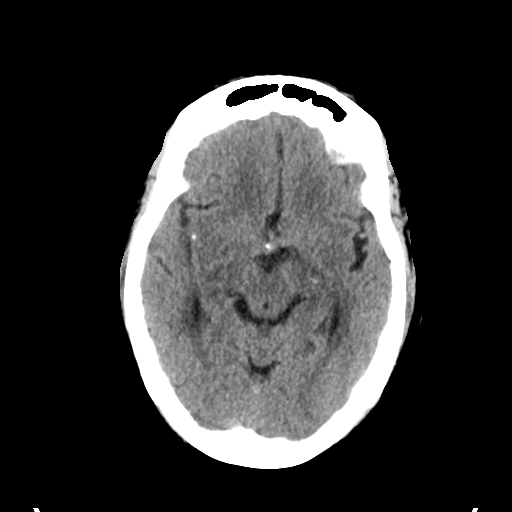
[im 17/33  brain]
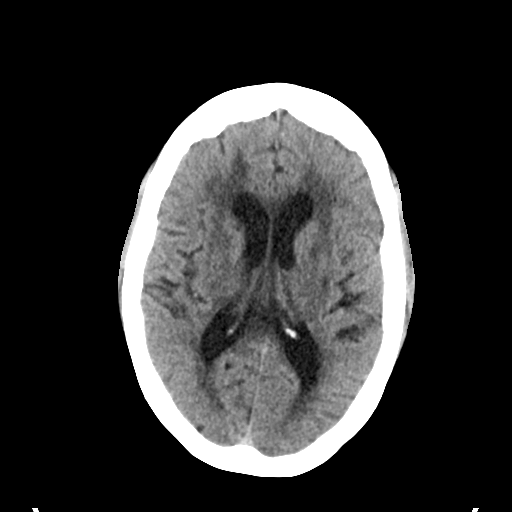
[im 17/33  bone]
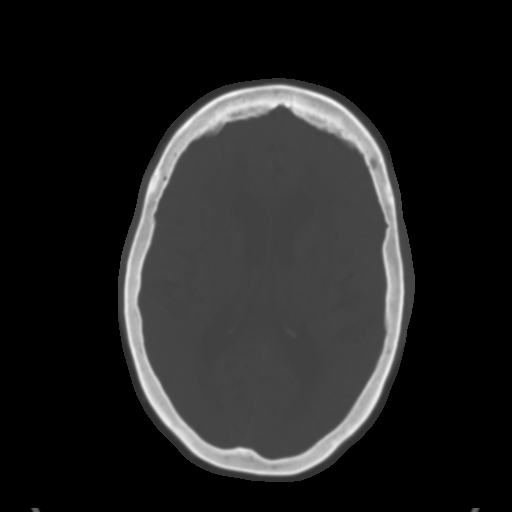
[im 20/33  brain]
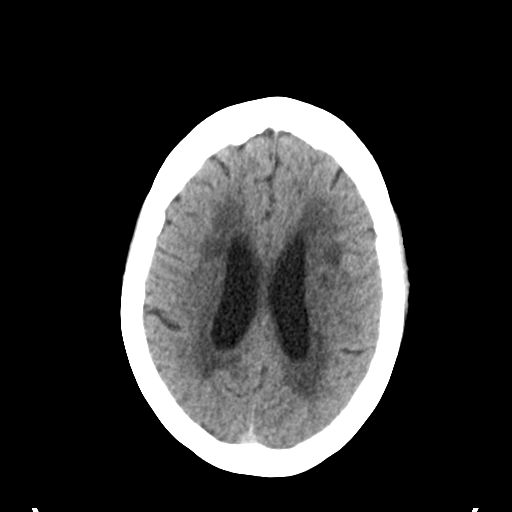
[im 24/33  brain]
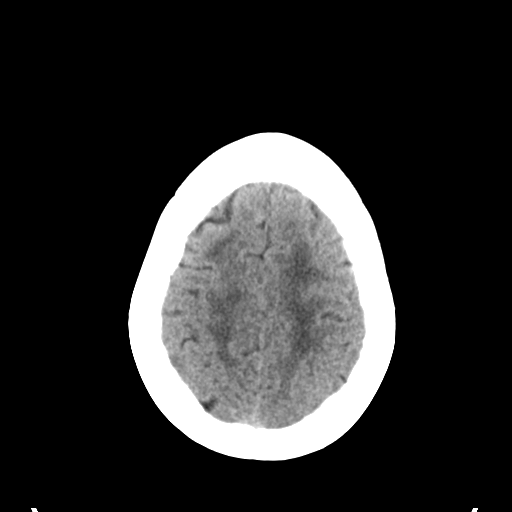
[im 27/33  brain]
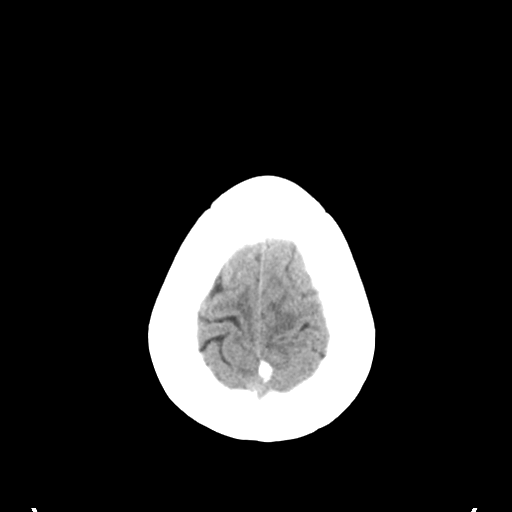
[im 30/33  brain]
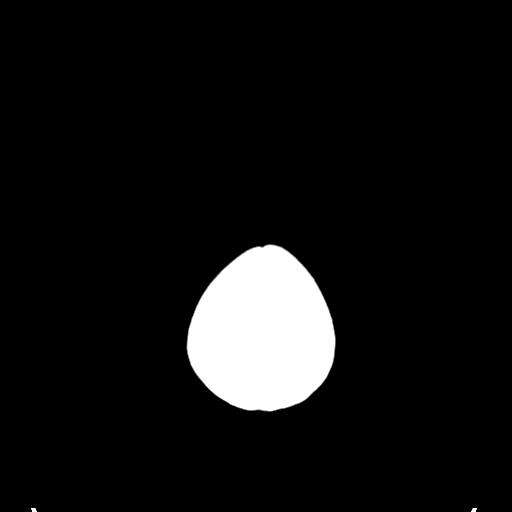
[im 30/33  bone]
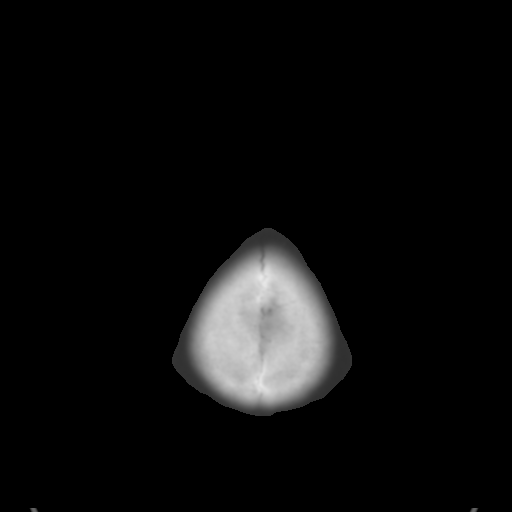

[Series 5: coronal soft tissue · coronal · 0.30mm/px · 3 of 71 slices shown]
[im 24/71  brain]
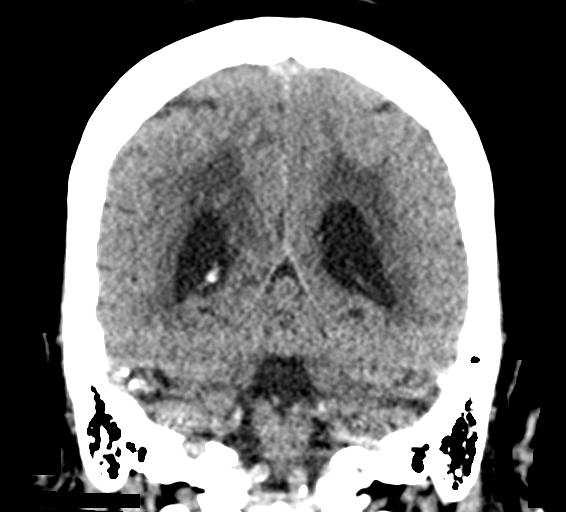
[im 32/71  brain]
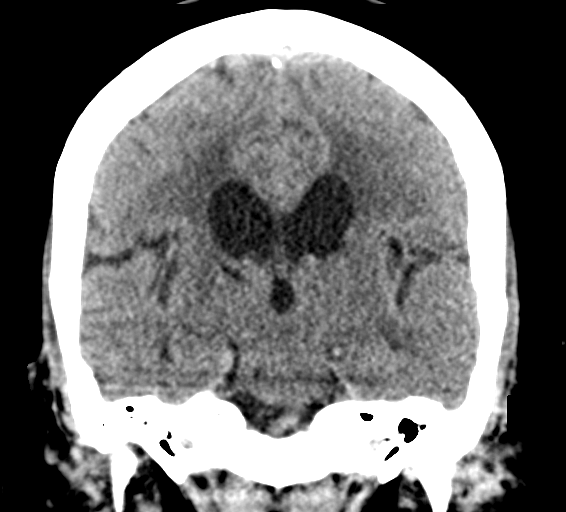
[im 39/71  brain]
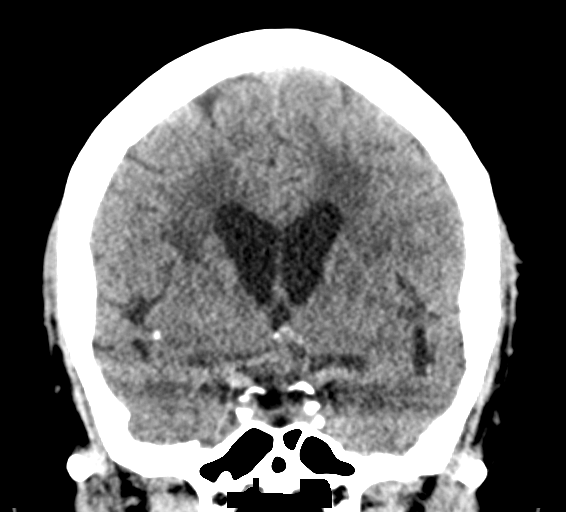

[Series 6: sagittal soft tissue · sagittal · 0.30mm/px · 3 of 55 slices shown]
[im 19/55  brain]
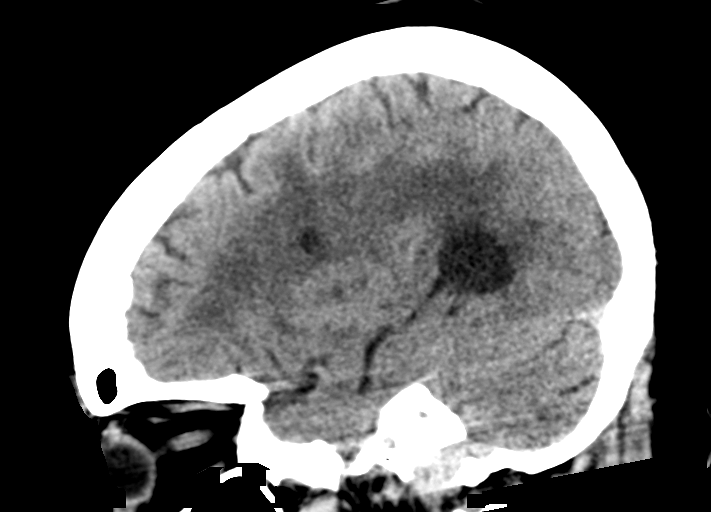
[im 28/55  brain]
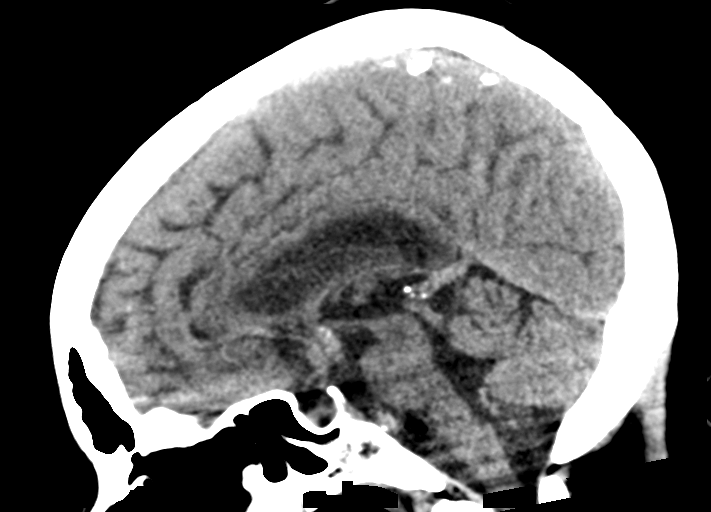
[im 37/55  brain]
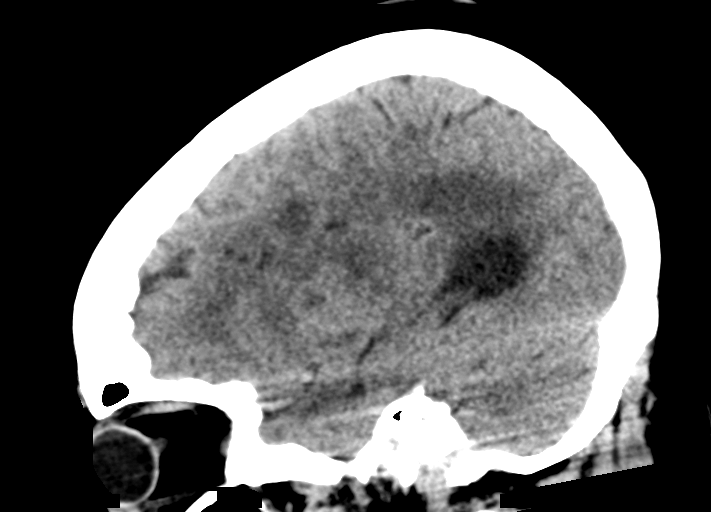

[15 of 47 positions shown; findings below may reference images not displayed]

FINDINGS: Brain: No intracranial hemorrhage. Advanced chronic microvascular
ischemic changes. Multifocal subacute left frontal infarcts, better
demonstrated on recent MRI. Chronic right frontal white matter,
right cerebellar, bilateral thalamic and basal ganglia insults. No
mass lesion. No midline shift, ventriculomegaly or extra-axial fluid
collection.

Vascular: No hyperdense vessel or unexpected calcification.
Bilateral skull base atherosclerotic calcifications.

Skull: No acute finding.

Sinuses/Orbits: No acute finding.

Other: None.

ASPECTS ([place] Stroke Program Early CT Score)

- Ganglionic level infarction (caudate, lentiform nuclei, internal
capsule, insula, M1-M3 cortex): 7

- Supraganglionic infarction (M4-M6 cortex): 3

Total score (0-10 with 10 being normal): 10
IMPRESSION: 1. No new infarct. Redemonstration of multifocal subacute left
frontal infarcts, better demonstrated on recent MRI.
2. ASPECTS is 10
3. Multifocal chronic lacunar insults as detailed above. Advanced
chronic microvascular ischemic changes.
4. Code stroke imaging results were communicated on [DATE] at
[DATE] to provider Dr. LOM via secure text paging.

## 2020-08-16 MED ORDER — CARVEDILOL 12.5 MG PO TABS
12.5000 mg | ORAL_TABLET | Freq: Two times a day (BID) | ORAL | Status: DC
  Administered 2020-08-17 – 2020-08-18 (×3): 12.5 mg via ORAL
  Filled 2020-08-16 (×3): qty 1

## 2020-08-16 MED ORDER — SODIUM CHLORIDE 0.9 % IV SOLN
20.0000 mg/kg | Freq: Once | INTRAVENOUS | Status: DC
  Filled 2020-08-16: qty 20.7

## 2020-08-16 MED ORDER — LACTATED RINGERS IV SOLN: INTRAVENOUS | Status: AC

## 2020-08-16 NOTE — Progress Notes (Signed)
Physical Therapy Treatment Patient Details Name: Patricia Ewing MRN: [id number] DOB: [birth year] Today's Date: 08/16/2020    History of Present Illness Pt is a 70 yo female that presented to ED for confusion. PMH of HTNm HLD, afib, glaucoma and tobacco use. MRI reveals "multifocal acute infarctions within the left frontal lobe and insula. Old right cerebellar infarct and findings of chronic microvascular disease as well as multifocal predominantly central chronic microhemorrhages, consistent with chronic hypertensive angiopathy."    PT Comments    Patient requiring mod assistance with extended time to come up to sitting at edge of bed.  Once seated she falls back onto bed and requires moderate assistance to come back up to sitting.  However eventually she is able to stabilize and sitting without external support.  When returning to supine therapist direct patient multiple times lay down however patient is unable to follow commands.  Eventually she requires max assistance of 2 to go back to supine and then max distance of 2 to slight up towards head of bed. Attempted sit to stand transfer with patient 3 times however despite maximal assistance from therapist patient unable to clear hips from the side of the bed.  After repeated trials terminated attempt. Patient unable to attempt ambulation on this date due to inability to transfer.  She is able to complete limited exercises in bed but with inconsistent step command follow.  Patient is thoroughly confused throughout therapy session and is AO x0 entire session.  When asked about her name, location, or reason for admission she continually perseverates on her birth date and repeats the incorrect birth date over and over again.  Vitals obtained at start of session and blood pressure is 156/99 mmHg and HR: 72. Therapist contacted RN who is familiar with patient and she comes to assess.  RN also determines that this is a gross deviation from her baseline  function and code stroke was called.  Session was terminated after patient is returned back to bed as stroke team takes over. Pt will benefit from PT services to address deficits in strength, balance, and mobility in order to return to full function at home.    Follow Up Recommendations  SNF;Supervision for mobility/OOB     Equipment Recommendations  Rolling walker with 5" wheels    Recommendations for Other Services       Precautions / Restrictions Precautions Precautions: Fall Restrictions Weight Bearing Restrictions: No    Mobility  Bed Mobility Overal bed mobility: Needs Assistance Bed Mobility: Supine to Sit     Supine to sit: Mod assist Sit to supine: Max assist;+2 for physical assistance   General bed mobility comments: Patient requiring mod assistance with extended time to come up to sitting at edge of bed.  Once seated she falls back onto bed and requires moderate assistance to come back up to sitting.  However eventually she is able to stabilize and sitting without external support.  When returning to supine therapist direct patient multiple times lay down however patient is unable to follow commands.  Eventually she requires max assistance of 2 to go back to supine and then max distance of 2 to slight up towards head of bed.  Transfers Overall transfer level: Needs assistance Equipment used: Rolling walker (2 wheeled) Transfers: Sit to/from Stand Sit to Stand: Max assist         General transfer comment: Attempted sit to stand transfer with patient 3 times however despite maximal assistance from therapist patient unable to clear  hips from the side of the bed.  After repeated trials terminated attempt.  Ambulation/Gait             General Gait Details: Patient unable to attempt ambulation on this date due to inability to transfer.   Stairs             Wheelchair Mobility    Modified Rankin (Stroke Patients Only)       Balance Overall balance  assessment: Needs assistance Sitting-balance support: Feet supported Sitting balance-Leahy Scale: Fair Sitting balance - Comments: Initially patient falling over backwards in bed once she comes off sitting however after time she is able to remain in seated position with both feet supported on floor.                                    Cognition Arousal/Alertness: Awake/alert Behavior During Therapy: Flat affect Overall Cognitive Status: No family/caregiver present to determine baseline cognitive functioning Area of Impairment: Orientation;Following commands;Attention                 Orientation Level: Disoriented to;Time;Person;Place (Pt continues to repeat her birthday incorrectly when asked) Current Attention Level: Selective   Following Commands: Follows one step commands inconsistently Safety/Judgement: Decreased awareness of deficits   Problem Solving: Slow processing;Decreased initiation;Requires verbal cues;Difficulty sequencing;Requires tactile cues General Comments: Pt is AOx0 at time of PT evaluation      Exercises General Exercises - Lower Extremity Ankle Circles/Pumps: AAROM;10 reps;Supine Quad Sets: Strengthening;Both;10 reps Gluteal Sets: Strengthening;Both;10 reps Hip ABduction/ADduction: Strengthening;Both;10 reps Straight Leg Raises: Strengthening;Both;10 reps    General Comments        Pertinent Vitals/Pain Pain Assessment: No/denies pain    Home Living                      Prior Function            PT Goals (current goals can now be found in the care plan section) Acute Rehab PT Goals Patient Stated Goal: to go home PT Goal Formulation: With patient Time For Goal Achievement: 08/27/20 Potential to Achieve Goals: Good Progress towards PT goals: Not progressing toward goals - comment    Frequency    7X/week      PT Plan Current plan remains appropriate    Co-evaluation              AM-PAC PT "6  Clicks" Mobility   Outcome Measure  Help needed turning from your back to your side while in a flat bed without using bedrails?: A Lot Help needed moving from lying on your back to sitting on the side of a flat bed without using bedrails?: A Lot Help needed moving to and from a bed to a chair (including a wheelchair)?: A Lot Help needed standing up from a chair using your arms (e.g., wheelchair or bedside chair)?: Total Help needed to walk in hospital room?: Total Help needed climbing 3-5 steps with a railing? : Total 6 Click Score: 9    End of Session Equipment Utilized During Treatment: Gait belt Activity Tolerance: Other (comment) (Patient limited by confusion) Patient left: in bed;with call bell/phone within reach;with bed alarm set;with nursing/sitter in room Nurse Communication: Mobility status PT Visit Diagnosis: Other abnormalities of gait and mobility (R26.89);Muscle weakness (generalized) (M62.81);Difficulty in walking, not elsewhere classified (R26.2)     Time: 0762-2633 PT Time Calculation (min) (ACUTE ONLY): 34 min  Charges:  $Therapeutic Exercise: 8-22 mins $Therapeutic Activity: 8-22 mins                     Lyndel Safe Hiilani Jetter PT, DPT, GCS    Jovonta Levit 08/16/2020, 3:03 PM

## 2020-08-16 NOTE — NC FL2 (Signed)
[place] LEVEL OF CARE SCREENING TOOL     IDENTIFICATION  Patient Name: Patricia Ewing Birthdate: [birth year] Sex: female Admission Date (Current Location): 08/12/2020  [place] and [place] Number:  Engineering geologist and Address:  [facility], [address] [place], [place] [address]      Provider Number: [id number]  Attending Physician Name and Address:  Sharen Hones, MD  Relative Name and Phone Number:  Korayma, Missael Ferrari (Spouse)   [phone] [facility])    Current Level of Care: Hospital Recommended Level of Care: [place] Prior Approval Number:    Date Approved/Denied:   PASRR Number: [id number]  Discharge Plan: SNF    Current Diagnoses: Patient Active Problem List   Diagnosis Date Noted  . Chronic kidney disease, stage 3b ([place]) 08/14/2020  . Acute arterial ischemic stroke, multifocal, anterior circulation, left ([place]) 08/13/2020  . Altered mental status 08/12/2020  . Obesity (BMI 30-39.9) 08/12/2020  . AKI (acute kidney injury) ([place]) 08/12/2020  . Sepsis ([place]) 11/29/2015  . Acute chest pain 11/30/2011  . HTN (hypertension) 11/30/2011  . Bilateral leg edema 11/30/2011    Orientation RESPIRATION BLADDER Height & Weight     Self,Place  Normal External catheter Weight: 227 lb 15.3 oz (103.4 kg) Height:  5\' 7"  (170.2 cm)  BEHAVIORAL SYMPTOMS/MOOD NEUROLOGICAL BOWEL NUTRITION STATUS        Diet (heart healthy/carb modified, thin liquids)  AMBULATORY STATUS COMMUNICATION OF NEEDS Skin   Limited Assist Verbally                         Personal Care Assistance Level of Assistance  Bathing,Feeding,Dressing Bathing Assistance: Limited assistance Feeding assistance: Independent Dressing Assistance: Limited assistance     Functional Limitations Info             SPECIAL CARE FACTORS FREQUENCY  PT (By licensed PT),OT (By licensed OT)     PT Frequency: 5 x/week OT Frequency: 5 x/week             Contractures      Additional Factors Info  Code Status,Allergies Code Status Info: full code Allergies Info: erthromycin, contrast media, metrizamide           Current Medications (08/16/2020):  This is the current hospital active medication list Current Facility-Administered Medications  Medication Dose Route Frequency Provider Last Rate Last Admin  . 0.9 %  sodium chloride infusion   Intravenous PRN Sharen Hones, MD   Stopped at 08/13/20 1905  . acetaminophen (TYLENOL) tablet 650 mg  650 mg Oral Q6H PRN Cox, Amy N, DO       Or  . acetaminophen (TYLENOL) suppository 325 mg  325 mg Rectal Q6H PRN Cox, Amy N, DO      . albuterol (VENTOLIN HFA) 108 (90 Base) MCG/ACT inhaler 1 puff  1 puff Inhalation Q6H PRN Cox, Amy N, DO      . amLODipine (NORVASC) tablet 10 mg  10 mg Oral Daily Sharen Hones, MD   10 mg at 08/15/20 1013  . aspirin EC tablet 81 mg  81 mg Oral Daily Cox, Amy N, DO   81 mg at 08/15/20 1013  . atorvastatin (LIPITOR) tablet 40 mg  40 mg Oral q1800 Cox, Amy N, DO   40 mg at 08/15/20 1728  . carvedilol (COREG) tablet 6.25 mg  6.25 mg Oral BID WC Cox, Amy N, DO   6.25 mg at 08/15/20 1728  . cloNIDine (CATAPRES)  tablet 0.2 mg  0.2 mg Oral Daily Cox, Amy N, DO   0.2 mg at 08/15/20 1013  . cyanocobalamin ((VITAMIN B-12)) injection 1,000 mcg  1,000 mcg Subcutaneous Daily Kerney Elbe, MD   1,000 mcg at 08/14/20 1250  . dorzolamide-timolol (COSOPT) 22.3-6.8 MG/ML ophthalmic solution 1 drop  1 drop Both Eyes BID Sharen Hones, MD   1 drop at 08/15/20 2108  . enoxaparin (LOVENOX) injection 40 mg  40 mg Subcutaneous Q24H Sharen Hones, MD   40 mg at 08/15/20 2108  . hydrALAZINE (APRESOLINE) injection 10 mg  10 mg Intravenous Q6H PRN Sharion Settler, NP      . hydrALAZINE (APRESOLINE) tablet 25 mg  25 mg Oral BID Cox, Amy N, DO   25 mg at 08/15/20 2108  . levothyroxine (SYNTHROID) tablet 50 mcg  50 mcg Oral Q0600 Sharen Hones, MD   50 mcg at 08/16/20 0606  . Netarsudil-Latanoprost  0.02-0.005 % SOLN 1 drop  1 drop Both Eyes BID Sharen Hones, MD      . nitroGLYCERIN (NITROSTAT) SL tablet 0.4 mg  0.4 mg Sublingual Q5 min PRN Cox, Amy N, DO      . ondansetron (ZOFRAN) tablet 4 mg  4 mg Oral Q6H PRN Cox, Amy N, DO       Or  . ondansetron (ZOFRAN) injection 4 mg  4 mg Intravenous Q6H PRN Cox, Amy N, DO      . thiamine tablet 100 mg  100 mg Oral Daily Kerney Elbe, MD      . timolol (TIMOPTIC) 0.5 % ophthalmic solution 1 drop  1 drop Both Eyes BID Sharen Hones, MD   1 drop at 08/15/20 2109     Discharge Medications: Please see discharge summary for a list of discharge medications.  Relevant Imaging Results:  Relevant Lab Results:   Additional Information SS #: [id number]  [place], LCSW

## 2020-08-16 NOTE — Progress Notes (Addendum)
NEUROLOGY CONSULTATION PROGRESS NOTE   Date of service: August 16, 2020 Patient Name: Patricia Ewing MRN:  [id number] DOB:  April 02, 1951  Brief HPI  Patricia Ewing is a 70 y.o. female with hx of pAfibb not on AC, HTN, HLD, tobacco use admitted with 3-4 weeks of confusion. MRI brain demonstrated acute left frontal and insular ischemic stroke. Workup with no LVO on MRA and Carotid duplex, no PFO on TTE. Noted to have Vit b12 defciency.   Interval Hx   Today, a stroke code was called with a LKW of 1130 on 08/16/2020. She was noted to be not as chatty as she had been in the morning and poor participation with her care and with PT at the bedside and some somnolence. Also noted to keep repeating her birthdate and perseverates on it. On my evaluation, she is unable to provide any meaningful history, when asked her name, she repeats her bithday "[birth year]". Intermittently is able to follow commands but does not repeat. When attempting to talk, most of what comes out are random words or phrases, almost like a word salad.  On discussion with bedside RN, she seemed fine this morning around 1130, when she came back from her lunch break to check on her, she seemed lethargic and not herself.  TPA: not a candidate due to recent ischemic stroke LKW: 1130 on 08/16/20 MRS: 2 Thrombectomy: Not a candidate due to suspected stroke in the same distribution as her recent stroke.  NIHSS components Score: Comment  1a Level of Conscious 0[x]  1[]  2[]  3[]      1b LOC Questions 0[]  1[]  2[x]       1c LOC Commands 0[]  1[x]  2[]       2 Best Gaze 0[x]  1[]  2[]       3 Visual 0[x]  1[]  2[]  3[]      4 Facial Palsy 0[x]  1[]  2[]  3[]      5a Motor Arm - left 0[x]  1[]  2[]  3[]  4[]  UN[]    5b Motor Arm - Right 0[x]  1[]  2[]  3[]  4[]  UN[]    6a Motor Leg - Left 0[x]  1[]  2[]  3[]  4[]  UN[]    6b Motor Leg - Right 0[x]  1[]  2[]  3[]  4[]  UN[]    7 Limb Ataxia 0[x]  1[]  2[]  3[]  UN[]     8 Sensory 0[x]  1[]  2[]  UN[]      9 Best Language 0[]  1[]  2[x]  3[]       10 Dysarthria 0[]  1[x]  2[]  UN[]      11 Extinct. and Inattention 0[x]  1[]  2[]       TOTAL: 6     Vitals   Vitals:   08/15/20 1603 08/16/20 1021 08/16/20 1410 08/16/20 1451  BP: (!) 175/89 (!) 193/106 (!) 156/99 (!) 162/85  Pulse: 66 80 72 77  Resp: 17     Temp: 97.9 F (36.6 C) 99 F (37.2 C)    TempSrc:  Oral    SpO2: 99% 99% 99%   Weight:      Height:         Body mass index is 35.7 kg/m.  Physical Exam   General: Laying comfortably in bed; in no acute distress. HENT: Normal oropharynx and mucosa. Normal external appearance of ears and nose. Neck: Supple, no pain or tenderness CV: No JVD. No peripheral edema. Pulmonary: Symmetric Chest rise. Normal respiratory effort. Abdomen: Soft to touch, non-tender. Ext: No cyanosis, edema, or deformity Skin: No rash. Normal palpation of skin.  Musculoskeletal: Normal digits and nails by inspection. No clubbing.  Neurologic Examination  Mental status/Cognition: Alert,  aphasia makes it difficult to assess her orientation. Speech/language: Non fluent, comprehension intermittently intact to simple commands but does have receptive aphasia, expressive aphasia when attempting to speak with word salad, unable to repeat/ Cranial nerves:   CN II Pupils equal and reactive to light, no VF deficits   CN III,IV,VI Opthalmoplegia, no gaze preference or deviation. EOMI intace on dolls eyes.   CN V    CN VII no asymmetry, no nasolabial fold flattening   CN VIII normal hearing to speech   CN IX & X    CN XI    CN XII midline tongue protrusion   Motor:  Muscle bulk: normal, tone normal, pronator drift none tremor none Mvmt Root Nerve  Muscle Right Left Comments  SA C5/6 Ax Deltoid     EF C5/6 Mc Biceps 5 5   EE C6/7/8 Rad Triceps 5 5   WF C6/7 Med FCR     WE C7/8 PIN ECU     F Ab C8/T1 U ADM/FDI 5 5   HF L1/2/3 Fem Illopsoas 3 3 Atleast, unable to get her to keep off her bed probably due to aphasia.  KE L2/3/4 Fem Quad     DF L4/5 D  [place]on Tib Ant     PF S1/2 Tibial Grc/Sol      Reflexes:  Right Left Comments  Pectoralis      Biceps (C5/6) 2 2   Brachioradialis (C5/6) 2 2    Triceps (C6/7) 2 2    Patellar (L3/4) 1 1    Achilles (S1) 0 0    Hoffman      Plantar mute mute   Jaw jerk    Sensation:  Light touch    Pin prick Vocalizes to pinch in al of her arms and legs.   Temperature    Vibration   Proprioception    Coordination/Complex Motor:  - Finger to Nose unable to assess but no ataxia noted.  - Heel to shin unable to assess but no obvious ataxia noted. - Gait: deferred.  Labs   Basic Metabolic Panel:  Lab Results  Component Value Date   NA 139 08/15/2020   K 3.7 08/15/2020   CO2 24 08/15/2020   GLUCOSE 103 (H) 08/15/2020   BUN 17 08/15/2020   CREATININE 1.42 (H) 08/15/2020   CALCIUM 8.9 08/15/2020   GFRNONAA 40 (L) 08/15/2020   GFRAA >60 12/06/2015   HbA1c:  Lab Results  Component Value Date   HGBA1C 5.8 (H) 11/30/2011   LDL:  Lab Results  Component Value Date   LDLCALC 92 12/01/2011   Urine Drug Screen: No results found for: LABOPIA, COCAINSCRNUR, LABBENZ, AMPHETMU, THCU, LABBARB  Alcohol Level     Component Value Date/Time   ETH <10 08/13/2020 0009   No results found for: PHENYTOIN, ZONISAMIDE, LAMOTRIGINE, LEVETIRACETA No results found for: PHENYTOIN, PHENOBARB, VALPROATE, CBMZ  Imaging and Diagnostic studies   CTH without contrast: CTH was negative for a large hypodensity concerning for a large territory infarct or hyperdensity concerning for an ICH  MRI Brain without contrast: Multifocal L MCA acute stroke.  MRA head without contrast: No LVO, no significant stenosis  US carotid doppler: No significant carotid stenosis  TTE: EF 70-75%, agitated saline bubble study negative.  Impression   Patricia Ewing is a 70 y.o. female with hx of pAfibb not on AC, HTN, HLD, tobacco use admitted with 3-4 weeks of confusion. MRI brain demonstrated acute left frontal and  insular ischemic stroke who  has new onset aphasia out of proportion to her encephalopathy. Not a tPA candidate due to stroke 4 days ago, no thrombectomy due to suspicion that her current symptoms are likely due to a new small or worsening infarcts in the noted prior L MCA distribution. I do not suspect that this is a new full L MCA distribution given no new focal right sided deficit. STAT CTH is negative for hemorrhage.  Will get repeat MRI Brain without contrast and consider anticoagulation given her hx of Afibb.  She is going to be somewhat high risk for ICH given the noted microhemorrhages on MRI Brain. However, these are likely hypertenisve in origin and it seems like she has not had a trial of AC in the past. In the absence of [facility], she will likely continue to have new strokes.  Recommendations  - I ordered repeat MRI Brain without contrast - Will consider Anticoagulation with Eliquis after MRI Brain. - repeat rEEG - to rule out epileptic aphasia. - KEppra 20mg /Kg IV once, will discuss maintenance based on EEG findings. ______________________________________________________________________   Thank you for the opportunity to take part in the care of this patient. If you have any further questions, please contact the neurology consultation attending.  Signed,  [place] Pager Number [phone]

## 2020-08-16 NOTE — Progress Notes (Signed)
Patient RN called to room by physical therapy to assist back to bed.  Patient sitting on the side of the bed and RN assisted PT in putting patient back in bed.  RN asked patient her name and patient responded 31-Jan-2051. Patient had incontinent urine episode. Yesterday, patient was conversing with staff, talking about her granddaughter, and walking in the hallway with PT and walking with rolling walker and standby assist to bathroom and feeding herself along with talking to family members on the phone.  Code stroke was called with patient lethargic with mental status changes.

## 2020-08-16 NOTE — Procedures (Signed)
Went to MRI.  Will do eeg tomorrow.

## 2020-08-16 NOTE — Progress Notes (Signed)
PROGRESS NOTE    Patricia Ewing  OZH:[id number] DOB: March 08, 1951 DOA: 08/12/2020 PCP: Baxter Hire, MD   Chief complaint altered mental status. Brief Narrative:  Patricia Venturella Patricia Ewing a 70 y.o.femalewith medical history significant forcataracts status post cataract extraction and left,coronary artery disease,pretension, atherosclerosis of abdominal aorta, history of NSTEMI, hyperlipidemia, atrial fibrillation, presented to the emergency department for chief concerns of confusion. MRI of the brain showed multifocal acute ischemia within the left frontal lobe and insula. She also has significant angiopathy.She has been seen by neurology,she also has overlapping acute thiamine deficiency. Started on high-dose of thiamine for 3 days. She has B12 deficiency, started subcu injection for 7 days.   Assessment & Plan:   Principal Problem:   Altered mental status Active Problems:   HTN (hypertension)   Obesity (BMI 30-39.9)   Acute arterial ischemic stroke, multifocal, anterior circulation, left (HCC)   Chronic kidney disease, stage 3b ([place])  #1.  Acute ischemic strokes in the left frontal lobe.  Chronic hypertensive angiopathy. Hypertension encephalopathy. Essential hypertension. Blood pressure running still high, increase Coreg to 12.5 mg twice a day.  Continue Norvasc started yesterday. Continue Lipitor and aspirin.  2.  Thiamine deficiency. Vitamin B12 deficiency. Continue IV supplements.  3.  Hypothyroidism. Continue Synthroid.  4.  Chronic kidney disease stage IIIb. Patient appears to not have sufficient oral intake.  We will give a liter of fluids today.  5.  Elevated troponin. Secondary to demand ischemia from stroke.     DVT prophylaxis: Lovenox Code Status: Full Family Communication:  Disposition Plan:  .   Status is: Inpatient  Remains inpatient appropriate because:Inpatient level of care appropriate due to severity of illness   Dispo: The patient is  from: Home              Anticipated d/c is to: SNF              Anticipated d/c date is: 2 days              Patient currently is not medically stable to d/c.        I/O last 3 completed shifts: In: 85 [P.O.:720] Out: -  Total I/O In: 240 [P.O.:240] Out: -      Consultants:   Neurology  Procedures: None  Antimicrobials: None  Subjective: Patient feels well today, her appetite is fair, but did not drink enough water. Denies any short of breath or cough. She has no confusion or headaches. No fever or chills.  Objective: Vitals:   08/15/20 0454 08/15/20 0842 08/15/20 1603 08/16/20 1021  BP: (!) 177/81 (!) 160/98 (!) 175/89 (!) 193/106  Pulse: 64 86 66 80  Resp: 16 18 17    Temp: 98.4 F (36.9 C) 99.2 F (37.3 C) 97.9 F (36.6 C) 99 F (37.2 C)  TempSrc:    Oral  SpO2: 100% 96% 99% 99%  Weight:      Height:        Intake/Output Summary (Last 24 hours) at 08/16/2020 1038 Last data filed at 08/16/2020 1000 Gross per 24 hour  Intake 480 ml  Output --  Net 480 ml   Filed Weights   08/12/20 1113 08/13/20 0137  Weight: 106.6 kg 103.4 kg    Examination:  General exam: Appears calm and comfortable  Respiratory system: Clear to auscultation. Respiratory effort normal. Cardiovascular system: S1 & S2 heard, RRR. No JVD, murmurs, rubs, gallops or clicks. No pedal edema. Gastrointestinal system: Abdomen is nondistended, soft and nontender.  No organomegaly or masses felt. Normal bowel sounds heard. Central nervous system: Alert and oriented x3. No focal neurological deficits. Extremities: Symmetric 5 x 5 power. Skin: No rashes, lesions or ulcers Psychiatry: Mood & affect appropriate.     Data Reviewed: I have personally reviewed following labs and imaging studies  CBC: Recent Labs  Lab 08/12/20 1117 08/13/20 0009 08/14/20 0440  WBC 6.4 7.1 6.1  NEUTROABS  --   --  3.9  HGB 12.8 12.2 12.4  HCT 38.4 36.2 36.8  MCV 94.8 94.3 94.6  PLT 214 188 [id number]    Basic Metabolic Panel: Recent Labs  Lab 08/12/20 1117 08/13/20 0009 08/14/20 0440 08/15/20 0644  NA 140 139 141 139  K 3.4* 3.3* 3.9 3.7  CL 103 105 106 106  CO2 24 26 27 24   GLUCOSE 135* 105* 107* 103*  BUN 20 18 15 17   CREATININE 1.42* 1.26* 1.37* 1.42*  CALCIUM 9.3 8.7* 9.1 8.9  MG 2.3  --  2.2  --    GFR: Estimated Creatinine Clearance: 45.6 mL/min (A) (by C-G formula based on SCr of 1.42 mg/dL (H)). Liver Function Tests: Recent Labs  Lab 08/12/20 1117  AST 17  ALT 13  ALKPHOS 73  BILITOT 0.7  PROT 8.3*  ALBUMIN 4.0   No results for input(s): LIPASE, AMYLASE in the last 168 hours. Recent Labs  Lab 08/13/20 0009  AMMONIA 16   Coagulation Profile: No results for input(s): INR, PROTIME in the last 168 hours. Cardiac Enzymes: No results for input(s): CKTOTAL, CKMB, CKMBINDEX, TROPONINI in the last 168 hours. BNP (last 3 results) No results for input(s): PROBNP in the last 8760 hours. HbA1C: No results for input(s): HGBA1C in the last 72 hours. CBG: No results for input(s): GLUCAP in the last 168 hours. Lipid Profile: No results for input(s): CHOL, HDL, LDLCALC, TRIG, CHOLHDL, LDLDIRECT in the last 72 hours. Thyroid Function Tests: No results for input(s): TSH, T4TOTAL, FREET4, T3FREE, THYROIDAB in the last 72 hours. Anemia Panel: No results for input(s): VITAMINB12, FOLATE, FERRITIN, TIBC, IRON, RETICCTPCT in the last 72 hours. Sepsis Labs: No results for input(s): PROCALCITON, LATICACIDVEN in the last 168 hours.  Recent Results (from the past 240 hour(s))  SARS CORONAVIRUS 2 (TAT 6-24 HRS) Nasopharyngeal Nasopharyngeal Swab     Status: None   Collection Time: 08/12/20  3:57 PM   Specimen: Nasopharyngeal Swab  Result Value Ref Range Status   SARS Coronavirus 2 NEGATIVE NEGATIVE Final    Comment: (NOTE) SARS-CoV-2 target nucleic acids are NOT DETECTED.  The SARS-CoV-2 RNA is generally detectable in upper and lower respiratory specimens during the  acute phase of infection. Negative results do not preclude SARS-CoV-2 infection, do not rule out co-infections with other pathogens, and should not be used as the sole basis for treatment or other patient management decisions. Negative results must be combined with clinical observations, patient history, and epidemiological information. The expected result is Negative.  Fact Sheet for Patients: SugarRoll.be  Fact Sheet for Healthcare Providers: https://www.woods-mathews.com/  This test is not yet approved or cleared by the [place] FDA and  has been authorized for detection and/or diagnosis of SARS-CoV-2 by FDA under an Emergency Use Authorization (EUA). This EUA will remain  in effect (meaning this test can be used) for the duration of the COVID-19 declaration under Se ction 564(b)(1) of the Act, 21 U.S.C. section 360bbb-3(b)(1), unless the authorization is terminated or revoked sooner.  Performed at [facility], [place] [address],  [place], [place] [address]          Radiology Studies: No results found.      Scheduled Meds: . amLODipine  10 mg Oral Daily  . aspirin EC  81 mg Oral Daily  . atorvastatin  40 mg Oral q1800  . carvedilol  12.5 mg Oral BID WC  . cloNIDine  0.2 mg Oral Daily  . cyanocobalamin  1,000 mcg Subcutaneous Daily  . dorzolamide-timolol  1 drop Both Eyes BID  . enoxaparin (LOVENOX) injection  40 mg Subcutaneous Q24H  . hydrALAZINE  25 mg Oral BID  . levothyroxine  50 mcg Oral Q0600  . Netarsudil-Latanoprost  1 drop Both Eyes BID  . thiamine  100 mg Oral Daily  . timolol  1 drop Both Eyes BID   Continuous Infusions: . sodium chloride Stopped (08/13/20 1905)  . lactated ringers       LOS: 3 days    Time spent: 27 minutes    Sharen Hones, MD [facility]   To contact the attending provider between 7A-7P or the covering provider during after hours 7P-7A, please log into the web  site [email or web address] and access using universal [place] password for that web site. If you do not have the password, please call the hospital operator.  08/16/2020, 10:38 AM

## 2020-08-16 NOTE — Plan of Care (Signed)
Patient unable to process the  change in her medical status

## 2020-08-16 NOTE — TOC Progression Note (Addendum)
Transition of Care [facility]) - Progression Note    Patient Details  Name: Patricia Ewing MRN: [id number] Date of Birth: 30-Jun-1951  Transition of Care [facility]) CM/SW [place], LCSW Phone Number: 08/16/2020, 9:14 AM  Clinical Narrative:   CSW attempted call to patient's spouse to follow up on discharge planning. Left a voicemail requesting a return call.  10:00- Call from patient's husband who says they would like SNF. They prefer [facility], explained they are not taking new admissions. He is agreeable to [facility]. Explained that CSW will send out to local SNFs so we have a back up option if [facility] cannot accept. He verbalized understanding. He reported patient is vaccinated for COVID. CSW will start SNF work up and insurance authorization. Reaching out to [place] at [facility] to ask him to review referral.  11:45- Called [employer] and spoke to [employer] due to patient not being in portal to start authorization. Juliann Pulse started authorization. SNF pending, will need to call [employer] when SNF is chosen. CSW uploaded [place] on [place] portal. Josem Kaufmann [phone].  2:30- Call from [place]. She asked which SNF patient is going to. Informed her family wants [facility] but no bed offer yet. She reported patient is approved, she wants to put it as [facility] and asked for a call if SNF changes. Authorization # [id number], start date 1/18, next review date 1/20.       Expected Discharge Plan and Services                                                 Social Determinants of Health (SDOH) Interventions    Readmission Risk Interventions No flowsheet data found.

## 2020-08-17 DIAGNOSIS — I63412 Cerebral infarction due to embolism of left middle cerebral artery: Secondary | ICD-10-CM

## 2020-08-17 LAB — CBC WITH DIFFERENTIAL/PLATELET
Abs Immature Granulocytes: 0.03 10*3/uL (ref 0.00–0.07)
Basophils Absolute: 0 10*3/uL (ref 0.0–0.1)
Basophils Relative: 0 %
Eosinophils Absolute: 0 10*3/uL (ref 0.0–0.5)
Eosinophils Relative: 0 %
HCT: 42.6 % (ref 36.0–46.0)
Hemoglobin: 14.1 g/dL (ref 12.0–15.0)
Immature Granulocytes: 0 %
Lymphocytes Relative: 11 %
Lymphs Abs: 1 10*3/uL (ref 0.7–4.0)
MCH: 31.1 pg (ref 26.0–34.0)
MCHC: 33.1 g/dL (ref 30.0–36.0)
MCV: 93.8 fL (ref 80.0–100.0)
Monocytes Absolute: 0.7 10*3/uL (ref 0.1–1.0)
Monocytes Relative: 7 %
Neutro Abs: 7.5 10*3/uL (ref 1.7–7.7)
Neutrophils Relative %: 82 %
Platelets: 232 10*3/uL (ref 150–400)
RBC: 4.54 MIL/uL (ref 3.87–5.11)
RDW: 13.1 % (ref 11.5–15.5)
WBC: 9.3 10*3/uL (ref 4.0–10.5)
nRBC: 0 % (ref 0.0–0.2)

## 2020-08-17 LAB — BASIC METABOLIC PANEL
Anion gap: 12 (ref 5–15)
BUN: 18 mg/dL (ref 8–23)
CO2: 22 mmol/L (ref 22–32)
Calcium: 9.6 mg/dL (ref 8.9–10.3)
Chloride: 104 mmol/L (ref 98–111)
Creatinine, Ser: 1.18 mg/dL — ABNORMAL HIGH (ref 0.44–1.00)
GFR, Estimated: 50 mL/min — ABNORMAL LOW (ref >60)
Glucose, Bld: 109 mg/dL — ABNORMAL HIGH (ref 70–99)
Potassium: 4 mmol/L (ref 3.5–5.1)
Sodium: 138 mmol/L (ref 135–145)

## 2020-08-17 LAB — MAGNESIUM: Magnesium: 2.2 mg/dL (ref 1.7–2.4)

## 2020-08-17 MED ORDER — LOSARTAN POTASSIUM 50 MG PO TABS
50.0000 mg | ORAL_TABLET | Freq: Every day | ORAL | Status: DC
  Administered 2020-08-17 – 2020-08-18 (×2): 50 mg via ORAL
  Filled 2020-08-17 (×2): qty 1

## 2020-08-17 NOTE — TOC Progression Note (Addendum)
Transition of Care [facility]) - Progression Note    Patient Details  Name: Patricia Ewing MRN: [id number] Date of Birth: 1951-05-25  Transition of Care [facility]) CM/SW [place], LCSW Phone Number: 08/17/2020, 8:54 AM  Clinical Narrative:   Reached out to Gerald Stabs and Tammy at [facility] again and asked him to review referral. Will present bed offers after hearing back from [facility].  2:20- Reached out to [facility] again, per Gerald Stabs they will have a bed for patient tomorrow. Patient has insurance auth through 1/20. Updated spouse Freddie via VM.        Expected Discharge Plan and Services                                                 Social Determinants of Health (SDOH) Interventions    Readmission Risk Interventions No flowsheet data found.

## 2020-08-17 NOTE — Procedures (Signed)
Patient Name: TONJA JEZEWSKI  MRN: [id number]  Epilepsy Attending: Lora Havens  Referring Physician/Provider: Dr Donnetta Simpers Date: 08/17/2020 Duration: 31.18 mins   Patient history: 70 y.o.femalewith hx of pAfibb not on AC, HTN, HLD, tobacco use admitted with 3-4 weeks of confusion. MRI brain demonstrated acute left frontal and insular ischemic strokewho has new onset aphasia out of proportion to her encephalopathy. EEG to evaluate for seizure  Level of alertness: Awake  AEDs during EEG study: None  Technical aspects: This EEG study was done with scalp electrodes positioned according to the 10-20 International system of electrode placement. Electrical activity was acquired at a sampling rate of 500Hz  and reviewed with a high frequency filter of 70Hz  and a low frequency filter of 1Hz . EEG data were recorded continuously and digitally stored.   Description: The posterior dominant rhythm consists of 8 Hz activity of moderate voltage (25-35 uV) seen predominantly in posterior head regions, symmetric and reactive to eye opening and eye closing. EEG showed intermittent generalized 2-3hz  delta slowing. Physiologic photic driving was not seen during photic stimulation.  Hyperventilation was not performed.     ABNORMALITY -Intermittent slow, generalized  IMPRESSION: This study is suggestive of mild diffuse encephalopathy, nonspecific etiology. No seizures or epileptiform discharges were seen throughout the recording.  Tsion Inghram Barbra Sarks

## 2020-08-17 NOTE — Progress Notes (Signed)
NEUROLOGY CONSULTATION PROGRESS NOTE   Date of service: August 17, 2020 Patient Name: Patricia Ewing MRN:  [id number] DOB:  January 19, 1951  Brief HPI  Patricia Ewing is a 70 y.o. female with hx of pAfibb not on AC, HTN, HLD, tobacco use admitted with 3-4 weeks of confusion. MRI brain demonstrated acute left frontal and insular ischemic stroke. Workup with no LVO on MRA and Carotid duplex, no PFO on TTE. Noted to have Vit b12 defciency.  Stroke code [place] 1/17 and she was noted to be more aphasic and perseverating on her birthdate. MRI Brain was negative.   Interval Hx   Aphasia improved. EEG is pending.  Vitals   Vitals:   08/16/20 1451 08/16/20 1553 08/17/20 0036 08/17/20 0851  BP: (!) 162/85 119/68 (!) 181/109 (!) 197/116  Pulse: 77 75 84 93  Resp:  18 16 15   Temp:  99.1 F (37.3 C) 98.7 F (37.1 C) 98.1 F (36.7 C)  TempSrc:  Oral    SpO2:  98% 100% 99%  Weight:      Height:         Body mass index is 35.7 kg/m.  Physical Exam   General: Laying comfortably in bed; in no acute distress. HENT: Normal oropharynx and mucosa. Normal external appearance of ears and nose. Neck: Supple, no pain or tenderness CV: No JVD. No peripheral edema. Pulmonary: Symmetric Chest rise. Normal respiratory effort. Abdomen: Soft to touch, non-tender. Ext: No cyanosis, edema, or deformity Skin: No rash. Normal palpation of skin.  Musculoskeletal: Normal digits and nails by inspection. No clubbing.  Neurologic Examination  Mental status/Cognition: Asleep, opens eye to loud voice, goes right back to sleep, requires a lot of encouragement to get her to keep her eyes open. oriented to self, place but not to month and year, poor attention. Speech/language: Fluent, comprehension intact to simple commands, able to name a few objects but not all, repetition intact. Cranial nerves:   CN II Pupils equal and reactive to light, no VF deficits   CN III,IV,VI EOM intact, no gaze preference or  deviation, no nystagmus   CN V normal sensation in V1, V2, and V3 segments bilaterally   CN VII no asymmetry, no nasolabial fold flattening   CN VIII normal hearing to speech   CN IX & X normal palatal elevation, no uvular deviation   CN XI 5/5 head turn and 5/5 shoulder shrug bilaterally   CN XII midline tongue protrusion   Motor:  Muscle bulk: normal, tone normal, pronator drift none tremor none Mvmt Root Nerve  Muscle Right Left Comments  SA C5/6 Ax Deltoid 5 5   EF C5/6 Mc Biceps 5 5   EE C6/7/8 Rad Triceps 5 5   WF C6/7 Med FCR     WE C7/8 PIN ECU     F Ab C8/T1 U ADM/FDI 5 5   HF L1/2/3 Fem Illopsoas 2 2   KE L2/3/4 Fem Quad     DF L4/5 D Peron Tib Ant     PF S1/2 Tibial Grc/Sol      Reflexes:  Right Left Comments  Pectoralis      Biceps (C5/6)     Brachioradialis (C5/6)      Triceps (C6/7)      Patellar (L3/4)      Achilles (S1)      Hoffman      Plantar     Jaw jerk    Sensation:  Light touch Intact throughout  Pin prick    Temperature    Vibration   Proprioception    Coordination/Complex Motor:  - Finger to Nose intact BL   Labs   Basic Metabolic Panel:  Lab Results  Component Value Date   NA 138 08/17/2020   K 4.0 08/17/2020   CO2 22 08/17/2020   GLUCOSE 109 (H) 08/17/2020   BUN 18 08/17/2020   CREATININE 1.18 (H) 08/17/2020   CALCIUM 9.6 08/17/2020   GFRNONAA 50 (L) 08/17/2020   GFRAA >60 12/06/2015   HbA1c:  Lab Results  Component Value Date   HGBA1C 5.8 (H) 11/30/2011   LDL:  Lab Results  Component Value Date   LDLCALC 92 12/01/2011   Urine Drug Screen: No results found for: LABOPIA, COCAINSCRNUR, LABBENZ, AMPHETMU, THCU, LABBARB  Alcohol Level     Component Value Date/Time   ETH <10 08/13/2020 0009   No results found for: PHENYTOIN, ZONISAMIDE, LAMOTRIGINE, LEVETIRACETA No results found for: PHENYTOIN, PHENOBARB, VALPROATE, CBMZ  Imaging and Diagnostic studies   CTH without contrast: CTH was negative for a large  hypodensity concerning for a large territory infarct or hyperdensity concerning for an ICH  MRI Brain without contrast: Multifocal L MCA acute stroke.  Repeat MRI Brain without contrast on 08/16/20: Multifocal subacute left frontal/insular infarcts are unchanged. No new site of infarct.  No acute intracranial hemorrhage.  MRA head without contrast: No LVO, no significant stenosis  US carotid doppler: No significant carotid stenosis  TTE: EF 70-75%, agitated saline bubble study negative.  Impression   Patricia Ewing is a 70 y.o. female with hx of pAfibb not on AC, HTN, HLD, tobacco use admitted with 3-4 weeks of confusion. MRI brain demonstrated acute left frontal and insular ischemic stroke who has new onset aphasia out of proportion to her encephalopathy. Aphasia improved today and now more proportionate to her encephalopathy. Repeat MRI Brain with no new stroke. EEG is pending.  Will discuss need for [facility] with family and discuss risks and benefits specially with her hx of chronic hypertensive angiopathy.  Recommendations  - rEEG pending - AC consideration after discussion with family. ______________________________________________________________________   Thank you for the opportunity to take part in the care of this patient. If you have any further questions, please contact the neurology consultation attending.  Signed,  [place] Pager Number [phone]

## 2020-08-17 NOTE — Progress Notes (Signed)
Occupational Therapy Treatment Patient Details Name: Patricia Ewing MRN: [id number] DOB: 1950-08-25 Today's Date: 08/17/2020    History of present illness Pt is a 70 yo female that presented to ED for confusion. PMH of HTNm HLD, afib, glaucoma and tobacco use. MRI reveals "multifocal acute infarctions within the left frontal lobe and insula. Old right cerebellar infarct and findings of chronic microvascular disease as well as multifocal predominantly central chronic microhemorrhages, consistent with chronic hypertensive angiopathy."   OT comments  Ms nabilah davoli for OT  This date - was evaluated and then had code called by PT yesterday -but per neurology was negative for CVA.  Prior to hospital admission, pt wasMOD I using SPC for community mobility and ADLs. Pt lives in 1 level home c husband available near 24/7 (unable to provide significant physical assist).Pt presents to acute OT demonstrating impaired ADL performance and functional mobility 2/2 poor insight into deficits, decreased activity tolerance, functional balance/strength deficits, and decreased safety awareness. Pt this date still unable to state correct month/year/location without significant cueing, demonstrates expressive difficulty.  MRI brain demonstrated acute left frontal and insular ischemic strokewho has new onset aphasia out of proportion to her encephalopathy with admission. Aphasia improved to more proportionate to her encephalopathy. Repeat MRI Brain with no new stroke after yesterday code was called - Pt showed weakness of R LE more than L - needed Mod A for supine to sit and sit to supine - but need Max x 2 for pulling up in bed - Max A for LB dressing- could not initiate task or after initiation by OT could not do next step. Pt can benefit from cont OT services     Follow Up Recommendations  SNF    Equipment Recommendations  3 in 1 bedside commode    Recommendations for Other Services      Precautions /  Restrictions Precautions Precautions: Fall       Mobility Bed Mobility Overal bed mobility: Needs Assistance Bed Mobility: Supine to Sit     Supine to sit: Mod assist (assist for R LE) Sit to supine: Max assist;+2 for physical assistance (need 2 + to pull up in bed - mod Sit to supine  - mostly with legs)      Transfers                      Balance Overall balance assessment: Needs assistance Sitting-balance support: Feet supported Sitting balance-Leahy Scale: Good Sitting balance - Comments: attempt to lean fw for footwear donn and doff - but could not reach - no LOB in sitting - hands on thighs                                   ADL either performed or assessed with clinical judgement   ADL Max A for LB dressing - for foot wear  And with pant could not initiate task - and after OT initiated task - pt could not initiate step 2  But was able to follow 1 step instructions for bed mobility and sitting EOB - no support and LOB     Vision       Perception     Praxis      Cognition Arousal/Alertness: Awake/alert Behavior During Therapy: Flat affect Overall Cognitive Status: No family/caregiver present to determine baseline cognitive functioning Area of Impairment: Orientation;Following commands;Attention  Following Commands: Follows one step commands inconsistently Safety/Judgement: Decreased awareness of deficits     General Comments: expressive communication - but follow 1 step command - but incorrect with use of L and R        Exercises Other Exercises Other Exercises: bilateral UE/shoulder stability in supine at 90 degrees - 1 min each Other Exercises: manual resistance provided for elbow flexion ,extention provided manually by OT - 30 reps - x 2 Other Exercises: Decrease grip in bilateral hands Other Exercises: R LE weaker than L - needs assistance to slide to EOB   Shoulder Instructions       General  Comments      Pertinent Vitals/ Pain       Pain Assessment: No/denies pain  Home Living Family/patient expects to be discharged to:: Private residence Living Arrangements: Spouse/significant other Available Help at Discharge: Family;Available 24 hours/day Type of Home: House Home Access: Stairs to enter     Home Layout: One level     Bathroom Shower/Tub: Tub/shower unit                    Prior Functioning/Environment              Frequency  Min 2X/week        Progress Toward Goals  OT Goals(current goals can now be found in the care plan section)     Acute Rehab OT Goals Patient Stated Goal: Get stronger OT Goal Formulation: With patient Time For Goal Achievement: 09/01/20 Potential to Achieve Goals: Good  Plan      Co-evaluation                 AM-PAC OT "6 Clicks" Daily Activity     Outcome Measure   Help from another person eating meals?: A Little Help from another person taking care of personal grooming?: A Little Help from another person toileting, which includes using toliet, bedpan, or urinal?: A Lot Help from another person bathing (including washing, rinsing, drying)?: A Lot Help from another person to put on and taking off regular upper body clothing?: A Lot Help from another person to put on and taking off regular lower body clothing?: A Lot 6 Click Score: 14    End of Session    OT Visit Diagnosis: Other abnormalities of gait and mobility (R26.89)   Activity Tolerance Patient tolerated treatment well   Patient Left in bed;with bed alarm set   Nurse Communication          Time: 9622-2979 OT Time Calculation (min): 24 min  Charges: OT General Charges $OT Visit: 1 Visit OT Treatments $Self Care/Home Management : 8-22 mins $Neuromuscular Re-education: 8-22 mins     Nicole Hafley OTR/L,CLT 08/17/2020, 2:25 PM

## 2020-08-17 NOTE — Progress Notes (Signed)
PT Cancellation Note  Patient Details Name: Patricia Ewing MRN: [id number] DOB: 12-28-1950   Cancelled Treatment:    Reason Eval/Treat Not Completed: Patient at procedure or test/unavailable.  Chart reviewed.  Pt currently not in her room.  Nurse reports pt off floor for EEG.  Will re-attempt PT session at a later date/time.  Leitha Bleak, PT 08/17/20, 3:00 PM

## 2020-08-17 NOTE — Progress Notes (Signed)
PROGRESS NOTE    Patricia Ewing  [id number] DOB: 1951-05-11 DOA: 08/12/2020 PCP: Baxter Hire, MD   Chief complaint.  Altered mental status. Brief Narrative:  Patricia Debois Jonesis a 70 y.o.femalewith medical history significant forcataracts status post cataract extraction and left,coronary artery disease,pretension, atherosclerosis of abdominal aorta, history of NSTEMI, hyperlipidemia, atrial fibrillation, presented to the emergency department for chief concerns of confusion. MRI of the brain showed multifocal acute ischemia within the left frontal lobe and insula. She also has significant angiopathy.She has been seen by neurology,she also has overlapping acute thiamine deficiency. Started on high-dose of thiamine for 3 days. She has B12 deficiency, started subcu injection for 7 days.   Assessment & Plan:   Principal Problem:   Altered mental status Active Problems:   HTN (hypertension)   Obesity (BMI 30-39.9)   Acute arterial ischemic stroke, multifocal, anterior circulation, left (HCC)   Chronic kidney disease, stage 3b ([place])  #1.  Acute ischemic stroke in the left frontal lobe. Chronic hypertensive angiopathy. Paroxysmal atrial fibrillation Hypertension encephalopathy. Essential hypertension. Patient blood pressure still running high, added losartan in addition to Coreg and Norvasc. Continue Lipitor and aspirin.   #2.  Thiamine deficiency. Vitamin B12 deficiency. Continue supplement.  3.  Hypothyroidism. Continues levothyroxine.  4.  Chronic kidney disease stage IIIb. Renal function is better given a liter of fluids  5.  Elevated troponin secondary to demand ischemia.      DVT prophylaxis: Lovenox Code Status: full Family Communication: Husband at the bedside. Disposition Plan:  .   Status is: Inpatient  Remains inpatient appropriate because:Unsafe d/c plan   Dispo: The patient is from: Home              Anticipated d/c is to: SNF               Anticipated d/c date is: 1 day              Patient currently is medically stable to d/c.        I/O last 3 completed shifts: In: 240 [P.O.:240] Out: 1200 [Urine:1200] No intake/output data recorded.     Consultants:   neurology  Procedures: None  Antimicrobials: None  Subjective: Patient still has a poor appetite, but no abdominal pain nausea vomiting.  Last bowel movement was 2 days ago. She still has some baseline confusion, no agitation. No shortness of breath or cough. No fever or chills.  Objective: Vitals:   08/17/20 0036 08/17/20 0851 08/17/20 1221 08/17/20 1224  BP: (!) 181/109 (!) 197/116  (!) 200/111  Pulse: 84 93 (!) 103 (!) 102  Resp: 16 15 16 16   Temp: 98.7 F (37.1 C) 98.1 F (36.7 C) 99 F (37.2 C) 99 F (37.2 C)  TempSrc:      SpO2: 100% 99% 100% 99%  Weight:      Height:        Intake/Output Summary (Last 24 hours) at 08/17/2020 1303 Last data filed at 08/17/2020 0700 Gross per 24 hour  Intake 0 ml  Output 1200 ml  Net -1200 ml   Filed Weights   08/12/20 1113 08/13/20 0137  Weight: 106.6 kg 103.4 kg    Examination:  General exam: Appears calm and comfortable  Respiratory system: Clear to auscultation. Respiratory effort normal. Cardiovascular system: S1 & S2 heard, RRR. No JVD, murmurs, rubs, gallops or clicks. No pedal edema. Gastrointestinal system: Abdomen is nondistended, soft and nontender. No organomegaly or masses felt. Normal bowel sounds heard. Central  nervous system: Alert and oriented x2. No focal neurological deficits. Extremities: Symmetric  Skin: No rashes, lesions or ulcers Psychiatry:  Mood & affect appropriate.     Data Reviewed: I have personally reviewed following labs and imaging studies  CBC: Recent Labs  Lab 08/12/20 1117 08/13/20 0009 08/14/20 0440 08/17/20 0656  WBC 6.4 7.1 6.1 9.3  NEUTROABS  --   --  3.9 7.5  HGB 12.8 12.2 12.4 14.1  HCT 38.4 36.2 36.8 42.6  MCV 94.8 94.3 94.6 93.8  PLT  214 188 189 161   Basic Metabolic Panel: Recent Labs  Lab 08/12/20 1117 08/13/20 0009 08/14/20 0440 08/15/20 0644 08/17/20 0656  NA 140 139 141 139 138  K 3.4* 3.3* 3.9 3.7 4.0  CL 103 105 106 106 104  CO2 24 26 27 24 22   GLUCOSE 135* 105* 107* 103* 109*  BUN 20 18 15 17 18   CREATININE 1.42* 1.26* 1.37* 1.42* 1.18*  CALCIUM 9.3 8.7* 9.1 8.9 9.6  MG 2.3  --  2.2  --  2.2   GFR: Estimated Creatinine Clearance: 54.8 mL/min (A) (by C-G formula based on SCr of 1.18 mg/dL (H)). Liver Function Tests: Recent Labs  Lab 08/12/20 1117  AST 17  ALT 13  ALKPHOS 73  BILITOT 0.7  PROT 8.3*  ALBUMIN 4.0   No results for input(s): LIPASE, AMYLASE in the last 168 hours. Recent Labs  Lab 08/13/20 0009  AMMONIA 16   Coagulation Profile: No results for input(s): INR, PROTIME in the last 168 hours. Cardiac Enzymes: No results for input(s): CKTOTAL, CKMB, CKMBINDEX, TROPONINI in the last 168 hours. BNP (last 3 results) No results for input(s): PROBNP in the last 8760 hours. HbA1C: No results for input(s): HGBA1C in the last 72 hours. CBG: No results for input(s): GLUCAP in the last 168 hours. Lipid Profile: No results for input(s): CHOL, HDL, LDLCALC, TRIG, CHOLHDL, LDLDIRECT in the last 72 hours. Thyroid Function Tests: No results for input(s): TSH, T4TOTAL, FREET4, T3FREE, THYROIDAB in the last 72 hours. Anemia Panel: No results for input(s): VITAMINB12, FOLATE, FERRITIN, TIBC, IRON, RETICCTPCT in the last 72 hours. Sepsis Labs: No results for input(s): PROCALCITON, LATICACIDVEN in the last 168 hours.  Recent Results (from the past 240 hour(s))  SARS CORONAVIRUS 2 (TAT 6-24 HRS) Nasopharyngeal Nasopharyngeal Swab     Status: None   Collection Time: 08/12/20  3:57 PM   Specimen: Nasopharyngeal Swab  Result Value Ref Range Status   SARS Coronavirus 2 NEGATIVE NEGATIVE Final    Comment: (NOTE) SARS-CoV-2 target nucleic acids are NOT DETECTED.  The SARS-CoV-2 RNA is  generally detectable in upper and lower respiratory specimens during the acute phase of infection. Negative results do not preclude SARS-CoV-2 infection, do not rule out co-infections with other pathogens, and should not be used as the sole basis for treatment or other patient management decisions. Negative results must be combined with clinical observations, patient history, and epidemiological information. The expected result is Negative.  Fact Sheet for Patients: SugarRoll.be  Fact Sheet for Healthcare Providers: https://www.woods-mathews.com/  This test is not yet approved or cleared by the [place] FDA and  has been authorized for detection and/or diagnosis of SARS-CoV-2 by FDA under an Emergency Use Authorization (EUA). This EUA will remain  in effect (meaning this test can be used) for the duration of the COVID-19 declaration under Se ction 564(b)(1) of the Act, 21 U.S.C. section 360bbb-3(b)(1), unless the authorization is terminated or revoked sooner.  Performed at [facility]  [facility], [place] [address] [place], [place] [address]          Radiology Studies: MR BRAIN WO CONTRAST  Result Date: 08/16/2020 CLINICAL DATA:  Neuro deficit, acute, stroke suspected EXAM: MRI HEAD WITHOUT CONTRAST TECHNIQUE: Multiplanar, multiecho pulse sequences of the brain and surrounding structures were obtained without intravenous contrast. COMPARISON:  08/16/2020 and prior. FINDINGS: Brain: Multifocal subacute left frontal and insular infarcts are unchanged. No new focus of restricted diffusion to suggest acute infarct. No acute intracranial hemorrhage. Centralized foci of SWI signal dropout likely reflect sequela of chronic hypertensive angiopathy. Mild cerebral atrophy with ex vacuo dilatation. Advanced chronic microvascular ischemic changes. Sequela of remote insult involving the right frontal white matter, bilateral basal ganglia, thalamus, pons and  right cerebellum. No midline shift, ventriculomegaly or extra-axial fluid collection. No mass lesion. Vascular: Please see recent MRA. Skull and upper cervical spine: Normal marrow signal. Sinuses/Orbits: Sequela of bilateral lens replacement. Clear paranasal sinuses and mastoid air cells. Other: None. IMPRESSION: Multifocal subacute left frontal/insular infarcts are unchanged. No new site of infarct.  No acute intracranial hemorrhage. Sequela of chronic hypertensive angiopathy. Advanced chronic microvascular ischemic changes. Electronically Signed   By: Primitivo Gauze M.D.   On: 08/16/2020 18:21   CT HEAD CODE STROKE WO CONTRAST  Result Date: 08/16/2020 CLINICAL DATA:  Code stroke.  Neuro deficit, acute, stroke suspected EXAM: CT HEAD WITHOUT CONTRAST TECHNIQUE: Contiguous axial images were obtained from the base of the skull through the vertex without intravenous contrast. COMPARISON:  08/12/2020 and prior. FINDINGS: Brain: No intracranial hemorrhage. Advanced chronic microvascular ischemic changes. Multifocal subacute left frontal infarcts, better demonstrated on recent MRI. Chronic right frontal white matter, right cerebellar, bilateral thalamic and basal ganglia insults. No mass lesion. No midline shift, ventriculomegaly or extra-axial fluid collection. Vascular: No hyperdense vessel or unexpected calcification. Bilateral skull base atherosclerotic calcifications. Skull: No acute finding. Sinuses/Orbits: No acute finding. Other: None. ASPECTS [facility] Stroke Program Early CT Score) - Ganglionic level infarction (caudate, lentiform nuclei, internal capsule, insula, M1-M3 cortex): 7 - Supraganglionic infarction (M4-M6 cortex): 3 Total score (0-10 with 10 being normal): 10 IMPRESSION: 1. No new infarct. Redemonstration of multifocal subacute left frontal infarcts, better demonstrated on recent MRI. 2. ASPECTS is 10 3. Multifocal chronic lacunar insults as detailed above. Advanced chronic microvascular  ischemic changes. 4. Code stroke imaging results were communicated on 08/16/2020 at 3:19 pm to provider Dr. Lorrin Goodell via secure text paging. Electronically Signed   By: Primitivo Gauze M.D.   On: 08/16/2020 15:22        Scheduled Meds: . amLODipine  10 mg Oral Daily  . aspirin EC  81 mg Oral Daily  . atorvastatin  40 mg Oral q1800  . carvedilol  12.5 mg Oral BID WC  . cloNIDine  0.2 mg Oral Daily  . cyanocobalamin  1,000 mcg Subcutaneous Daily  . dorzolamide-timolol  1 drop Both Eyes BID  . enoxaparin (LOVENOX) injection  40 mg Subcutaneous Q24H  . hydrALAZINE  25 mg Oral BID  . levothyroxine  50 mcg Oral Q0600  . Netarsudil-Latanoprost  1 drop Both Eyes BID  . thiamine  100 mg Oral Daily  . timolol  1 drop Both Eyes BID   Continuous Infusions: . sodium chloride Stopped (08/13/20 1905)     LOS: 4 days    Time spent: 28 minutes    Sharen Hones, MD [facility]   To contact the attending provider between 7A-7P or the covering provider during after hours  7P-7A, please log into the web site [email or web address] and access using universal [place] password for that web site. If you do not have the password, please call the hospital operator.  08/17/2020, 1:03 PM

## 2020-08-17 NOTE — Progress Notes (Signed)
eeg done °

## 2020-08-17 NOTE — Care Management Important Message (Signed)
Important Message  Patient Details  Name: Patricia Ewing MRN: [id number] Date of Birth: 10-Aug-1950   Medicare Important Message Given:  Yes     Juliann Pulse A Walton Digilio 08/17/2020, 11:51 AM

## 2020-08-18 LAB — THYROID PANEL WITH TSH
Free Thyroxine Index: 1.7 (ref 1.2–4.9)
T3 Uptake Ratio: 24 % (ref 24–39)
T4, Total: 7 ug/dL (ref 4.5–12.0)
TSH: 6.04 u[IU]/mL — ABNORMAL HIGH (ref 0.450–4.500)

## 2020-08-18 LAB — GLUCOSE, CAPILLARY: Glucose-Capillary: 137 mg/dL — ABNORMAL HIGH (ref 70–99)

## 2020-08-18 LAB — RESP PANEL BY RT-PCR (FLU A&B, COVID) ARPGX2
Influenza A by PCR: NEGATIVE
Influenza B by PCR: NEGATIVE
SARS Coronavirus 2 by RT PCR: NEGATIVE

## 2020-08-18 MED ORDER — CARVEDILOL 6.25 MG PO TABS: 12.5000 mg | ORAL_TABLET | Freq: Two times a day (BID) | ORAL | Status: AC

## 2020-08-18 MED ORDER — HYDRALAZINE HCL 25 MG PO TABS: 25.0000 mg | ORAL_TABLET | Freq: Two times a day (BID) | ORAL | Status: AC

## 2020-08-18 MED ORDER — LOSARTAN POTASSIUM 50 MG PO TABS: 50.0000 mg | ORAL_TABLET | Freq: Every day | ORAL | Status: AC

## 2020-08-18 MED ORDER — ATORVASTATIN CALCIUM 40 MG PO TABS: 40.0000 mg | ORAL_TABLET | Freq: Every day | ORAL | Status: AC

## 2020-08-18 MED ORDER — THIAMINE HCL 100 MG PO TABS: 100.0000 mg | ORAL_TABLET | Freq: Every day | ORAL | Status: AC

## 2020-08-18 MED ORDER — CYANOCOBALAMIN 1000 MCG/ML IJ SOLN: 1000.0000 ug | INTRAMUSCULAR | 0 refills | Status: AC

## 2020-08-18 MED ORDER — AMLODIPINE BESYLATE 10 MG PO TABS: 10.0000 mg | ORAL_TABLET | Freq: Every day | ORAL | Status: AC

## 2020-08-18 MED ORDER — LEVOTHYROXINE SODIUM 50 MCG PO TABS: 50.0000 ug | ORAL_TABLET | Freq: Every day | ORAL | Status: AC

## 2020-08-18 NOTE — Plan of Care (Addendum)
Pt alert and confused, per report she was able to state her name and have conversation. Provider notified about the change, Vitals WDL. No acute concerns at this time 0550. Pt alert, able to state name but not DOB.  Problem: Education: Goal: Knowledge of General Education information will improve Description: Including pain rating scale, medication(s)/side effects and non-pharmacologic comfort measures Outcome: Progressing   Problem: Pain Managment: Goal: General experience of comfort will improve Outcome: Progressing   Problem: Education: Goal: Knowledge of disease or condition will improve Outcome: Progressing

## 2020-08-18 NOTE — Plan of Care (Signed)
  Problem: Education: Goal: Knowledge of General Education information will improve Description: Including pain rating scale, medication(s)/side effects and non-pharmacologic comfort measures Outcome: Adequate for Discharge   Problem: Health Behavior/Discharge Planning: Goal: Ability to manage health-related needs will improve Outcome: Adequate for Discharge   Problem: Coping: Goal: Level of anxiety will decrease Outcome: Adequate for Discharge   Problem: Elimination: Goal: Will not experience complications related to bowel motility Outcome: Adequate for Discharge Goal: Will not experience complications related to urinary retention Outcome: Adequate for Discharge   Problem: Pain Managment: Goal: General experience of comfort will improve Outcome: Adequate for Discharge   Problem: Safety: Goal: Ability to remain free from injury will improve Outcome: Adequate for Discharge   Problem: Skin Integrity: Goal: Risk for impaired skin integrity will decrease Outcome: Adequate for Discharge   Problem: Acute Rehab OT Goals (only OT should resolve) Goal: Pt. Will Perform Grooming Outcome: Adequate for Discharge Goal: Pt. Will Perform Lower Body Dressing Outcome: Adequate for Discharge Goal: Pt. Will Transfer To Toilet Outcome: Adequate for Discharge   Problem: Acute Rehab OT Goals (only OT should resolve) Goal: Pt. Will Perform Grooming Outcome: Adequate for Discharge Goal: Pt. Will Perform Lower Body Dressing Outcome: Adequate for Discharge Goal: Pt. Will Transfer To Toilet Outcome: Adequate for Discharge   Problem: Acute Rehab PT Goals(only PT should resolve) Goal: Pt Will Go Supine/Side To Sit Outcome: Adequate for Discharge Goal: Patient Will Transfer Sit To/From Stand Outcome: Adequate for Discharge Goal: Pt Will Ambulate Outcome: Adequate for Discharge Goal: Pt Will Go Up/Down Stairs Outcome: Adequate for Discharge   Problem: Education: Goal: Knowledge of disease  or condition will improve Outcome: Adequate for Discharge   Problem: Education: Goal: Knowledge of disease or condition will improve Outcome: Adequate for Discharge Goal: Knowledge of secondary prevention will improve Outcome: Adequate for Discharge Goal: Knowledge of patient specific risk factors addressed and post discharge goals established will improve Outcome: Adequate for Discharge   Problem: Self-Care: Goal: Verbalization of feelings and concerns over difficulty with self-care will improve Outcome: Adequate for Discharge Goal: Ability to communicate needs accurately will improve Outcome: Adequate for Discharge   Problem: Nutrition: Goal: Dietary intake will improve Outcome: Adequate for Discharge

## 2020-08-18 NOTE — Discharge Summary (Addendum)
Physician Discharge Summary  Patient ID: [name] MRN: [id number] DOB/AGE: [birth year] 70 y.o.  Admit date: 08/12/2020 Discharge date: 08/18/2020  Admission Diagnoses:  Discharge Diagnoses:  Principal Problem:   Altered mental status Active Problems:   HTN (hypertension)   Obesity (BMI 30-39.9)   Acute arterial ischemic stroke, multifocal, anterior circulation, left (HCC)   Chronic kidney disease, stage 3b [facility])   Discharged Condition: fair  Hospital Course:  Patricia Goodson Jonesis a 70 y.o.femalewith medical history significant forcataracts status post cataract extraction and left,coronary artery disease,pretension, atherosclerosis of abdominal aorta, history of NSTEMI, hyperlipidemia, atrial fibrillation, presented to the emergency department for chief concerns of confusion. MRI of the brain showed multifocal acute ischemia within the left frontal lobe and insula. She also has significant angiopathy.She has been seen by neurology,she also has overlapping acute thiamine deficiency. Started on high-dose of thiamine for 3 days. She has B12 deficiency, started subcu injection for 7 days.  #1.  Acute ischemic stroke in the left frontal lobe. Chronic hypertensive angiopathy. Paroxysmal atrial fibrillation Hypertension encephalopathy. Essential hypertension. Patient blood pressure still running high, added losartan in addition to Coreg and Norvasc.  Please monitor blood pressure. Continue Lipitor and aspirin. Due to significant weakness, risk of fall is high.  Anticoagulation not started.  Patient will follow-up with neurology to continue discussion.   #2.  Thiamine deficiency. Vitamin B12 deficiency. Continue supplement.  3.  Hypothyroidism. Continues levothyroxine.  4.  Chronic kidney disease stage IIIb. Renal function is better given a liter of fluids  5.  Elevated troponin secondary to demand ischemia.  Based on my assessment, patient has high risk for  readmission.  She also has a poor long-term prognosis due to severe debility and multiple medical problems.  May consider palliative care.  Consults: neurology  Significant Diagnostic Studies:   MRI HEAD WITHOUT CONTRAST  TECHNIQUE: Multiplanar, multiecho pulse sequences of the brain and surrounding structures were obtained without intravenous contrast.  COMPARISON:  08/16/2020 and prior.  FINDINGS: Brain: Multifocal subacute left frontal and insular infarcts are unchanged. No new focus of restricted diffusion to suggest acute infarct. No acute intracranial hemorrhage. Centralized foci of SWI signal dropout likely reflect sequela of chronic hypertensive angiopathy. Mild cerebral atrophy with ex vacuo dilatation. Advanced chronic microvascular ischemic changes.  Sequela of remote insult involving the right frontal white matter, bilateral basal ganglia, thalamus, pons and right cerebellum. No midline shift, ventriculomegaly or extra-axial fluid collection. No mass lesion.  Vascular: Please see recent MRA.  Skull and upper cervical spine: Normal marrow signal.  Sinuses/Orbits: Sequela of bilateral lens replacement. Clear paranasal sinuses and mastoid air cells.  Other: None.  IMPRESSION: Multifocal subacute left frontal/insular infarcts are unchanged.  No new site of infarct.  No acute intracranial hemorrhage.  Sequela of chronic hypertensive angiopathy. Advanced chronic microvascular ischemic changes.   Electronically Signed   By: Primitivo Gauze M.D.   On: 08/16/2020 18:21    Treatments: thiamine, vitamin b12, symptomatic treatment  Discharge Exam: Blood pressure (!) 171/95, pulse 81, temperature 98.8 F (37.1 C), resp. rate 17, height 5\' 7"  (1.702 m), weight 100.8 kg, SpO2 98 %. General appearance: alert and cooperative Resp: clear to auscultation bilaterally Cardio: regular rate and rhythm, S1, S2 normal, no murmur, click, rub or  gallop GI: soft, non-tender; bowel sounds normal; no masses,  no organomegaly Extremities: extremities normal, atraumatic, no cyanosis or edema  Disposition: Discharge disposition: 03-Skilled Nursing Facility       Discharge Instructions    Diet -  low sodium heart healthy   Complete by: As directed    Increase activity slowly   Complete by: As directed      Allergies as of 08/18/2020      Reactions   Erythromycin Diarrhea, Nausea Only   Contrast Media [iodinated Diagnostic Agents] Other (See Comments)   Flushed, cough, anxiety, mild less responsiveness.   Metrizamide       Medication List    STOP taking these medications   furosemide 40 MG tablet Commonly known as: LASIX   loratadine 10 MG tablet Commonly known as: CLARITIN     TAKE these medications   albuterol 108 (90 Base) MCG/ACT inhaler Commonly known as: VENTOLIN HFA Inhale 1 puff into the lungs every 6 (six) hours as needed. Shortness of breath   amLODipine 10 MG tablet Commonly known as: NORVASC Take 1 tablet (10 mg total) by mouth daily. Start taking on: August 19, 2020   aspirin 81 MG tablet Take 81 mg by mouth daily.   atorvastatin 40 MG tablet Commonly known as: LIPITOR Take 1 tablet (40 mg total) by mouth daily at 6 PM.   brimonidine 0.1 % Soln Commonly known as: ALPHAGAN P Place 1 drop into both eyes every 8 (eight) hours.   carvedilol 6.25 MG tablet Commonly known as: COREG Take 2 tablets (12.5 mg total) by mouth 2 (two) times daily with a meal. What changed: how much to take   cetirizine 10 MG tablet Commonly known as: ZYRTEC Take 10 mg by mouth daily as needed. allergies   cloNIDine 0.2 MG tablet Commonly known as: CATAPRES Take 0.2 mg by mouth daily.   cyanocobalamin 1000 MCG/ML injection Commonly known as: (VITAMIN B-12) Inject 1 mL (1,000 mcg total) into the muscle every 7 (seven) days for 4 doses.   dorzolamide-timolol 22.3-6.8 MG/ML ophthalmic solution Commonly known as:  COSOPT Place 1 drop into both eyes 2 (two) times daily.   hydrALAZINE 25 MG tablet Commonly known as: APRESOLINE Take 1 tablet (25 mg total) by mouth in the morning and at bedtime.   latanoprost 0.005 % ophthalmic solution Commonly known as: XALATAN Place 1 drop into both eyes at bedtime.   levothyroxine 50 MCG tablet Commonly known as: SYNTHROID Take 1 tablet (50 mcg total) by mouth daily at 6 (six) AM. Start taking on: August 19, 2020   losartan 50 MG tablet Commonly known as: COZAAR Take 1 tablet (50 mg total) by mouth daily. Start taking on: August 19, 2020   nitroGLYCERIN 0.4 MG SL tablet Commonly known as: NITROSTAT Place 1 tablet (0.4 mg total) under the tongue every 5 (five) minutes as needed for chest pain.   [place] 0.02-0.005 % Soln Generic drug: Netarsudil-Latanoprost Place 1 drop into both eyes at bedtime.   thiamine 100 MG tablet Take 1 tablet (100 mg total) by mouth daily. Start taking on: August 19, 2020   timolol 0.5 % ophthalmic solution Commonly known as: TIMOPTIC Place 1 drop into both eyes 2 (two) times daily.       Contact information for follow-up providers    Baxter Hire, MD Follow up in 1 week(s).   Specialty: Internal Medicine Contact information: [place] [address] [phone]        [facility] Follow up in 1 week(s).   Contact information: [place] [phone]           Contact information for after-discharge care  Destination    [facility] Preferred SNF .   Service: Skilled Nursing Contact information: [address] [place] [phone]                More than 30 minutes  Signed: Sharen Hones 08/18/2020, 11:01 AM

## 2020-08-18 NOTE — TOC Transition Note (Addendum)
Transition of Care [facility]) - CM/SW Discharge Note   Patient Details  Name: KINZLY PIERRELOUIS MRN: [id number] Date of Birth: 09/29/1950  Transition of Care [facility]) CM/SW Contact:  Magnus Ivan, LCSW Phone Number: 08/18/2020, 1:21 PM   Clinical Narrative:   Patient to discharge to [facility]) today, Room 807. Confirmed with [facility] at [facility] CSW updated MD, RN, spouse via voicemail x 2. Asked RN to call report and MD to submit DC Summary. Medical Necessity Form and Face Sheet placed in Discharge Packet by patient chart. [facility] arranged for 2:15 pick up time. No other needs identified.     Final next level of care: Skilled Nursing Facility Barriers to Discharge: Barriers Resolved   Patient Goals and CMS Choice Patient states their goals for this hospitalization and ongoing recovery are:: SNF rehab CMS Medicare.gov Compare Post Acute Care list provided to:: Patient Represenative (must comment) Choice offered to / list presented to : Spouse  Discharge Placement              Patient chooses bed at: [facility] [place] Patient to be transferred to facility by: EMS Name of family member notified: [name] - spouse Patient and family notified of of transfer: 08/18/20  Discharge Plan and Services                                     Social Determinants of Health (SDOH) Interventions     Readmission Risk Interventions No flowsheet data found.

## 2020-08-19 LAB — VITAMIN B1: Vitamin B1 (Thiamine): 124 nmol/L (ref 66.5–200.0)

## 2020-09-28 DEATH — deceased
